# Patient Record
Sex: Female | Born: 1935
Health system: Southern US, Community
[De-identification: ages and names within clinical notes are randomized; demographics above are authoritative.]

## PROBLEM LIST (undated history)

## (undated) DIAGNOSIS — I454 Nonspecific intraventricular block: Secondary | ICD-10-CM

## (undated) DIAGNOSIS — M1990 Unspecified osteoarthritis, unspecified site: Secondary | ICD-10-CM

## (undated) DIAGNOSIS — I10 Essential (primary) hypertension: Secondary | ICD-10-CM

## (undated) DIAGNOSIS — B029 Zoster without complications: Secondary | ICD-10-CM

## (undated) DIAGNOSIS — E785 Hyperlipidemia, unspecified: Secondary | ICD-10-CM

## (undated) DIAGNOSIS — I129 Hypertensive chronic kidney disease with stage 1 through stage 4 chronic kidney disease, or unspecified chronic kidney disease: Secondary | ICD-10-CM

## (undated) DIAGNOSIS — I701 Atherosclerosis of renal artery: Secondary | ICD-10-CM

## (undated) DIAGNOSIS — K219 Gastro-esophageal reflux disease without esophagitis: Secondary | ICD-10-CM

## (undated) DIAGNOSIS — F329 Major depressive disorder, single episode, unspecified: Secondary | ICD-10-CM

## (undated) DIAGNOSIS — F32A Depression, unspecified: Secondary | ICD-10-CM

## (undated) DIAGNOSIS — N183 Chronic kidney disease, stage 3 unspecified: Secondary | ICD-10-CM

## (undated) DIAGNOSIS — F419 Anxiety disorder, unspecified: Secondary | ICD-10-CM

## (undated) DIAGNOSIS — F319 Bipolar disorder, unspecified: Secondary | ICD-10-CM

## (undated) DIAGNOSIS — E039 Hypothyroidism, unspecified: Secondary | ICD-10-CM

## (undated) DIAGNOSIS — K449 Diaphragmatic hernia without obstruction or gangrene: Secondary | ICD-10-CM

## (undated) DIAGNOSIS — M8580 Other specified disorders of bone density and structure, unspecified site: Secondary | ICD-10-CM

## (undated) DIAGNOSIS — I359 Nonrheumatic aortic valve disorder, unspecified: Secondary | ICD-10-CM

## (undated) HISTORY — DX: Chronic kidney disease, stage 3 unspecified: N18.30

## (undated) HISTORY — DX: Essential (primary) hypertension: I10

## (undated) HISTORY — DX: Other specified disorders of bone density and structure, unspecified site: M85.80

## (undated) HISTORY — DX: Nonrheumatic aortic valve disorder, unspecified: I35.9

## (undated) HISTORY — DX: Hyperlipidemia, unspecified: E78.5

## (undated) HISTORY — DX: Hypertensive chronic kidney disease with stage 1 through stage 4 chronic kidney disease, or unspecified chronic kidney disease: I12.9

## (undated) HISTORY — DX: Zoster without complications: B02.9

## (undated) HISTORY — DX: Diaphragmatic hernia without obstruction or gangrene: K44.9

## (undated) HISTORY — DX: Anxiety disorder, unspecified: F41.9

## (undated) HISTORY — DX: Depression, unspecified: F32.A

## (undated) HISTORY — DX: Nonspecific intraventricular block: I45.4

## (undated) HISTORY — DX: Major depressive disorder, single episode, unspecified: F32.9

## (undated) HISTORY — DX: Atherosclerosis of renal artery: I70.1

## (undated) HISTORY — DX: Gastro-esophageal reflux disease without esophagitis: K21.9

## (undated) HISTORY — DX: Hypothyroidism, unspecified: E03.9

## (undated) HISTORY — DX: Chronic kidney disease, stage 3 (moderate): N18.3

## (undated) HISTORY — DX: Bipolar disorder, unspecified: F31.9

## (undated) HISTORY — DX: Unspecified osteoarthritis, unspecified site: M19.90

---

## 1961-11-26 HISTORY — PX: HEMORRHOID SURGERY: SHX153

## 1998-06-02 ENCOUNTER — Other Ambulatory Visit: Admission: RE | Admit: 1998-06-02 | Discharge: 1998-06-02 | Payer: Self-pay | Admitting: Obstetrics and Gynecology

## 1999-06-06 ENCOUNTER — Other Ambulatory Visit: Admission: RE | Admit: 1999-06-06 | Discharge: 1999-06-06 | Payer: Self-pay | Admitting: Obstetrics and Gynecology

## 2000-12-26 ENCOUNTER — Encounter: Payer: Self-pay | Admitting: Emergency Medicine

## 2000-12-26 ENCOUNTER — Emergency Department (HOSPITAL_COMMUNITY): Admission: EM | Admit: 2000-12-26 | Discharge: 2000-12-26 | Payer: Self-pay | Admitting: *Deleted

## 2001-04-18 ENCOUNTER — Other Ambulatory Visit: Admission: RE | Admit: 2001-04-18 | Discharge: 2001-04-18 | Payer: Self-pay | Admitting: Obstetrics and Gynecology

## 2002-04-21 ENCOUNTER — Other Ambulatory Visit: Admission: RE | Admit: 2002-04-21 | Discharge: 2002-04-21 | Payer: Self-pay | Admitting: Obstetrics and Gynecology

## 2004-03-21 HISTORY — PX: CARDIAC CATHETERIZATION: SHX172

## 2006-11-05 ENCOUNTER — Ambulatory Visit: Payer: Self-pay | Admitting: Internal Medicine

## 2006-11-11 ENCOUNTER — Ambulatory Visit: Payer: Self-pay | Admitting: Internal Medicine

## 2006-11-11 ENCOUNTER — Encounter (INDEPENDENT_AMBULATORY_CARE_PROVIDER_SITE_OTHER): Payer: Self-pay | Admitting: *Deleted

## 2006-11-11 HISTORY — PX: PANENDOSCOPY: SHX2159

## 2006-11-11 HISTORY — PX: COLONOSCOPY: SHX174

## 2006-11-11 LAB — HM COLONOSCOPY

## 2010-04-02 ENCOUNTER — Emergency Department (HOSPITAL_COMMUNITY): Admission: EM | Admit: 2010-04-02 | Discharge: 2010-04-02 | Payer: Self-pay | Admitting: Emergency Medicine

## 2010-09-21 ENCOUNTER — Ambulatory Visit (HOSPITAL_COMMUNITY)
Admission: RE | Admit: 2010-09-21 | Discharge: 2010-09-21 | Payer: Self-pay | Source: Home / Self Care | Admitting: Surgery

## 2011-02-07 LAB — BASIC METABOLIC PANEL
BUN: 23 mg/dL (ref 6–23)
CO2: 29 mEq/L (ref 19–32)
Calcium: 9.9 mg/dL (ref 8.4–10.5)
Chloride: 107 mEq/L (ref 96–112)
Creatinine, Ser: 1.36 mg/dL — ABNORMAL HIGH (ref 0.4–1.2)
GFR calc Af Amer: 45 mL/min — ABNORMAL LOW (ref >60)
GFR calc non Af Amer: 37 mL/min — ABNORMAL LOW (ref >60)
Glucose, Bld: 100 mg/dL — ABNORMAL HIGH (ref 70–99)
Potassium: 5.3 mEq/L — ABNORMAL HIGH (ref 3.5–5.1)
Sodium: 140 mEq/L (ref 135–145)

## 2011-02-07 LAB — CBC
HCT: 33.1 % — ABNORMAL LOW (ref 36.0–46.0)
Hemoglobin: 10.8 g/dL — ABNORMAL LOW (ref 12.0–15.0)
MCH: 32 pg (ref 26.0–34.0)
MCHC: 32.6 g/dL (ref 30.0–36.0)
MCV: 97.9 fL (ref 78.0–100.0)
Platelets: 263 10*3/uL (ref 150–400)
RBC: 3.38 MIL/uL — ABNORMAL LOW (ref 3.87–5.11)
RDW: 12.6 % (ref 11.5–15.5)
WBC: 5.9 10*3/uL (ref 4.0–10.5)

## 2011-02-07 LAB — SURGICAL PCR SCREEN
MRSA, PCR: NEGATIVE
Staphylococcus aureus: NEGATIVE

## 2011-04-13 NOTE — Assessment & Plan Note (Signed)
[facility]                         GASTROENTEROLOGY OFFICE NOTE   LANDON, MOLESKI                        MRN:          [id number]  DATE:11/05/2006                            DOB:          June 15, 1923    REASON FOR CONSULTATION:  Screening colonoscopy.   HISTORY:  This is an 77 year old female with a history of  hyperlipidemia, hypothyroidism, and osteoarthritis who is referred  through the courtesy of Dr. Reynaldo Minium regarding screening colonoscopy.  The patient reports to me that she has been encouraged on several  occasions to undergo screening colonoscopy.  At this point she is  agreeable and is referred.  In terms of lower GI complaints, she reports  intermittent problems with abdominal cramping, loose stools  postprandially.  This has occurred over the years.  Occasionally four  bowel movements per day (generally in the morning).  Never after  retiring to bed for the evening.  She denies change in appetite, weight  loss, change in stool caliber, or rectal bleeding.  Next, she reports  chronic problems with heartburn and indigestion.  She takes no  medication.  In addition, she has had intermittent solid food dysphagia  for years.  She denies prior upper endoscopy.  She has had stool  hemoccults and sigmoidoscopy in the past which by her report have been  unremarkable.   PAST MEDICAL HISTORY:  1. Dyslipidemia.  2. Hypothyroidism.  3. Osteoarthritis.  4. Depression.  5. Hypertension.   PAST SURGICAL HISTORY:  Hemorrhoidectomy.   ALLERGIES:  No known drug allergies.   CURRENT MEDICATIONS:  1. Lithium 600 mg daily.  2. Wellbutrin 300 mg daily.  3. Altace 15 mg daily.  4. Vytorin 10 mg daily.  5. Synthroid 0.025 mg daily.  6. Aspirin 81 mg daily.  7. Zoloft 150 mg daily.  8. Toprol-XL 25 mg daily.  9. Vitamin E 400 international units daily.  10.Multivitamin.  11.Fosamax 70 mg once weekly.  12.Vitamin C 500 mg daily.   FAMILY  HISTORY:  No family history of gastrointestinal malignancy.  Two  brothers with diabetes and heart disease.   SOCIAL HISTORY:  The patient is married with one daughter.  Lives with  her husband.  Has a high school education.  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  Does not smoke or use  alcohol.   REVIEW OF SYSTEMS:  Per diagnostic evaluation form.   PHYSICAL EXAMINATION:  GENERAL:  Well-appearing female in no acute  distress.  VITAL SIGNS:  Blood pressure 112/54, heart rate is 56 and regular,  weight is 130 pounds.  She is 5 feet in height.  HEENT:  Sclerae are anicteric.  Conjunctivae are pink.  Oral mucosa is  intact.  There is no oral thrush.  Thyroid is normal.  No adenopathy.  LUNGS:  Clear to auscultation.  HEART:  Regular without murmur.  ABDOMEN:  Soft, nontender, nondistended, with good bowel sounds.  No  organomegaly, mass or hernia.  EXTREMITIES:  Without edema.   IMPRESSION:  1. Screening colonoscopy.  The patient is an appropriate  candidate      without contraindication.  2. Chronic lower gastrointestinal complaints manifested by      postprandial cramping with urgency and loose stools.  Most likely      irritable bowel.  3. Chronic gastroesophageal reflux disease with intermittent      dysphagia.  Rule out peptic stricture.  4. Normocytic anemia on recent laboratories with a hemoglobin of 11.8.   RECOMMENDATIONS:  1. Colonoscopy with polypectomy if indicated.  This to provide colon      cancer screening, evaluate lower GI complaints, as well as anemia.      The nature of the procedure as well as the risks, benefits, and      alternatives were reviewed in detail.  She understood and agreed to      proceed.  2. Initiate proton pump inhibitor therapy in the form of AcipHex 20 mg      daily.  This for symptomatic reflux disease.  Multiple AcipHex      samples have been provided.  3. Schedule upper endoscopy with possible esophageal  dilation.  The      nature of the procedure, as well as the risks, benefits and      alternatives were reviewed.  She understood and agreed to proceed.     Docia Chuck. Henrene Pastor, MD  Electronically Signed    JNP/MedQ  DD: 11/05/2006  DT: 11/06/2006  Job #: [id number]   cc:   Burnard Bunting, M.D.

## 2012-06-19 LAB — HM MAMMOGRAPHY

## 2013-04-10 ENCOUNTER — Ambulatory Visit: Payer: Self-pay | Admitting: Cardiovascular Disease

## 2013-04-15 ENCOUNTER — Encounter: Payer: Self-pay | Admitting: Cardiovascular Disease

## 2013-04-22 ENCOUNTER — Ambulatory Visit: Payer: Self-pay | Admitting: Cardiovascular Disease

## 2013-05-07 ENCOUNTER — Encounter: Payer: Self-pay | Admitting: Cardiovascular Disease

## 2013-05-07 ENCOUNTER — Ambulatory Visit: Payer: Self-pay | Admitting: Cardiovascular Disease

## 2013-05-07 ENCOUNTER — Ambulatory Visit: Payer: Self-pay | Admitting: Cardiology

## 2013-05-28 ENCOUNTER — Ambulatory Visit (INDEPENDENT_AMBULATORY_CARE_PROVIDER_SITE_OTHER): Payer: Medicare PPO | Admitting: Cardiovascular Disease

## 2013-05-28 ENCOUNTER — Encounter: Payer: Self-pay | Admitting: Cardiovascular Disease

## 2013-05-28 VITALS — BP 130/60 | HR 71 | Ht 62.0 in | Wt 138.0 lb

## 2013-05-28 DIAGNOSIS — I701 Atherosclerosis of renal artery: Secondary | ICD-10-CM

## 2013-05-28 DIAGNOSIS — I10 Essential (primary) hypertension: Secondary | ICD-10-CM

## 2013-05-28 DIAGNOSIS — E785 Hyperlipidemia, unspecified: Secondary | ICD-10-CM

## 2013-05-28 DIAGNOSIS — R0989 Other specified symptoms and signs involving the circulatory and respiratory systems: Secondary | ICD-10-CM

## 2013-05-28 DIAGNOSIS — I447 Left bundle-branch block, unspecified: Secondary | ICD-10-CM

## 2013-05-28 NOTE — Assessment & Plan Note (Signed)
On statin therapy followed by Dr. Reynaldo Minium

## 2013-05-28 NOTE — Assessment & Plan Note (Signed)
Left carotid Doppler was in 2008. We will recheck

## 2013-05-28 NOTE — Assessment & Plan Note (Signed)
On appropriate medications under good control

## 2013-05-28 NOTE — Progress Notes (Signed)
05/28/2013 Lynn Hayes   May 31, 1923  [id number]  Primary Physician No primary provider on file. Primary Cardiologist: [name] Renae Gloss   HPI:  The patient is a delightful 79 year old thin-appearing married Caucasian female, mother of 55, grandmother to 2 grandchildren, who I last saw a year ago. She has a history of normal coronary arteries by catheterization, which I performed March 21, 2004. At that time, I documented an 80% left renal artery stenosis, which we have been following by duplex ultrasound. This has remained remarkably stable. She has hypertension, hyperlipidemia, and chronic left bundle branch block. She is totally asymptomatic except for occasional atypical chest pain. Recent renal Dopplers, performed February 06, 2012, revealed no change with only mild renal artery stenosis by duplex. Her most recent lab work performed by Dr. Reynaldo Minium revealed a total cholesterol of 150, LDL of 57, HDL of 61 performed one year ago.    Current Outpatient Prescriptions  Medication Sig Dispense Refill  . aspirin EC 81 MG tablet Take 81 mg by mouth daily.      Marland Kitchen buPROPion (WELLBUTRIN XL) 150 MG 24 hr tablet Take 450 mg by mouth daily.      . DULoxetine (CYMBALTA) 60 MG capsule Take 60 mg by mouth daily.      Marland Kitchen L-Methylfolate (DEPLIN) 7.5 MG TABS Take 1 tablet by mouth daily.      Marland Kitchen levothyroxine (SYNTHROID, LEVOTHROID) 50 MCG tablet Take 50 mcg by mouth daily before breakfast.      . lithium carbonate 150 MG capsule Take 150 mg by mouth daily.      . metoprolol succinate (TOPROL-XL) 25 MG 24 hr tablet Take 25 mg by mouth daily.      . paliperidone (INVEGA) 3 MG 24 hr tablet Take 3 mg by mouth every morning.      . polycarbophil (FIBERCON) 625 MG tablet Take 625 mg by mouth daily.      . ramipril (ALTACE) 10 MG capsule Take 10 mg by mouth daily.      . simvastatin (ZOCOR) 10 MG tablet Take 10 mg by mouth at bedtime.       No current facility-administered medications for  this visit.    No Known Allergies  History   Social History  . Marital Status: Married    Spouse Name: N/A    Number of Children: 1  . Years of Education: N/A   Occupational History  . Not on file.   Social History Main Topics  . Smoking status: Never Smoker   . Smokeless tobacco: Never Used  . Alcohol Use: No  . Drug Use: No  . Sexually Active: Not on file   Other Topics Concern  . Not on file   Social History Narrative   Pt is married, mother of 20, grandmother of 2.       Review of Systems: General: negative for chills, fever, night sweats or weight changes.  Cardiovascular: negative for chest pain, dyspnea on exertion, edema, orthopnea, palpitations, paroxysmal nocturnal dyspnea or shortness of breath Dermatological: negative for rash Respiratory: negative for cough or wheezing Urologic: negative for hematuria Abdominal: negative for nausea, vomiting, diarrhea, bright red blood per rectum, melena, or hematemesis Neurologic: negative for visual changes, syncope, or dizziness All other systems reviewed and are otherwise negative except as noted above.    Blood pressure 130/60, pulse 71, height 5\' 2"  (1.575 m), weight 138 lb (62.596 kg).  General appearance: alert and no distress Neck: no adenopathy, no  JVD, supple, symmetrical, trachea midline, thyroid not enlarged, symmetric, no tenderness/mass/nodules and soft right carotid bruit Lungs: clear to auscultation bilaterally Heart: regular rate and rhythm, S1, S2 normal, no murmur, click, rub or gallop Extremities: extremities normal, atraumatic, no cyanosis or edema  EKG normal sinus rhythm at 71 with a left bundle branch block unchanged from prior EKG  ASSESSMENT AND PLAN:   Essential hypertension On appropriate medications under good control  Hyperlipidemia On statin therapy followed by Dr. Reynaldo Minium  Carotid bruit Left carotid Doppler was in 2008. We will recheck      [name]  [facility], [facility] 05/28/2013 4:29 PM

## 2013-05-28 NOTE — Patient Instructions (Addendum)
Your physician has requested that you have a carotid duplex. This test is an ultrasound of the carotid arteries in your neck. It looks at blood flow through these arteries that supply the brain with blood. Allow one hour for this exam. There are no restrictions or special instructions.  Your physician wants you to follow-up in: 1 year.  You will receive a reminder letter in the mail two months in advance. If you don't receive a letter, please call our office to schedule the follow-up appointment.  

## 2013-06-01 ENCOUNTER — Other Ambulatory Visit (HOSPITAL_COMMUNITY): Payer: Self-pay | Admitting: Cardiovascular Disease

## 2013-06-01 DIAGNOSIS — I739 Peripheral vascular disease, unspecified: Secondary | ICD-10-CM

## 2013-06-09 ENCOUNTER — Ambulatory Visit (HOSPITAL_COMMUNITY)
Admission: RE | Admit: 2013-06-09 | Discharge: 2013-06-09 | Disposition: A | Discharge status: Home / Self Care | Payer: Medicare PPO | Source: Ambulatory Visit | Attending: Cardiovascular Disease | Admitting: Cardiovascular Disease

## 2013-06-09 DIAGNOSIS — I701 Atherosclerosis of renal artery: Secondary | ICD-10-CM

## 2013-06-09 DIAGNOSIS — I739 Peripheral vascular disease, unspecified: Secondary | ICD-10-CM | POA: Insufficient documentation

## 2013-06-09 NOTE — Progress Notes (Signed)
Renal Artery Duplex Completed. °Lynn Hayes ° °

## 2013-06-18 ENCOUNTER — Ambulatory Visit (HOSPITAL_COMMUNITY)
Admission: RE | Admit: 2013-06-18 | Discharge: 2013-06-18 | Disposition: A | Discharge status: Home / Self Care | Payer: Medicare PPO | Source: Ambulatory Visit | Attending: Cardiovascular Disease | Admitting: Cardiovascular Disease

## 2013-06-18 DIAGNOSIS — R0989 Other specified symptoms and signs involving the circulatory and respiratory systems: Secondary | ICD-10-CM | POA: Insufficient documentation

## 2013-06-18 NOTE — Progress Notes (Signed)
Carotid Duplex Completed. Rita Sturdivant, RDMS, RVT  

## 2013-06-24 ENCOUNTER — Encounter: Payer: Self-pay | Admitting: *Deleted

## 2013-07-02 ENCOUNTER — Other Ambulatory Visit: Payer: Self-pay | Admitting: *Deleted

## 2013-07-02 MED ORDER — SIMVASTATIN 10 MG PO TABS: 10.0000 mg | ORAL_TABLET | Freq: Every day | ORAL | Status: DC

## 2013-07-15 ENCOUNTER — Other Ambulatory Visit: Payer: Self-pay | Admitting: *Deleted

## 2013-07-15 MED ORDER — SIMVASTATIN 10 MG PO TABS: 10.0000 mg | ORAL_TABLET | Freq: Every day | ORAL | Status: DC

## 2013-07-15 NOTE — Telephone Encounter (Signed)
Rx was sent to pharmacy electronically. 

## 2014-03-01 ENCOUNTER — Other Ambulatory Visit: Payer: Self-pay | Admitting: *Deleted

## 2014-03-01 MED ORDER — METOPROLOL SUCCINATE ER 25 MG PO TB24: 25.0000 mg | ORAL_TABLET | Freq: Every day | ORAL | Status: DC

## 2014-03-01 NOTE — Telephone Encounter (Signed)
Rx refill sent to pharmacy. 

## 2014-04-03 ENCOUNTER — Other Ambulatory Visit: Payer: Self-pay | Admitting: Cardiovascular Disease

## 2014-04-05 ENCOUNTER — Encounter: Payer: Self-pay | Admitting: Physician Assistant

## 2014-04-05 ENCOUNTER — Ambulatory Visit (INDEPENDENT_AMBULATORY_CARE_PROVIDER_SITE_OTHER): Payer: Medicare PPO | Admitting: Physician Assistant

## 2014-04-05 VITALS — BP 160/60 | HR 79 | Ht 62.0 in | Wt 133.0 lb

## 2014-04-05 DIAGNOSIS — R079 Chest pain, unspecified: Secondary | ICD-10-CM

## 2014-04-05 DIAGNOSIS — I10 Essential (primary) hypertension: Secondary | ICD-10-CM

## 2014-04-05 DIAGNOSIS — E785 Hyperlipidemia, unspecified: Secondary | ICD-10-CM

## 2014-04-05 NOTE — Assessment & Plan Note (Signed)
Blood pressure is elevated and however, corning to the patient's daughter she may have missed some doses of metoprolol and/or ramipril

## 2014-04-05 NOTE — Patient Instructions (Signed)
1.  Take Aleve other NSAID for the next three days as need and directed on the bottle. 2.  Follow up in July with Dr. Gwenlyn Found or sooner if needed.

## 2014-04-05 NOTE — Progress Notes (Signed)
Date:  04/05/2014   ID:  Lynn Hayes, DOB 08/11/1923, MRN [id number]  PCP:  No primary provider on file.  Primary Cardiologist:  Gwenlyn Found    History of Present Illness: Lynn Hayes is a 80 y.o.  thin-appearing married Caucasian female, mother of 81, grandmother to 2 grandchildren, who last saw Dr. Gwenlyn Found in July last year. She has a history of normal coronary arteries by catheterization, which Dr. Gwenlyn Found performed March 21, 2004. At that time, he documented an 80% left renal artery stenosis, which we have been following by duplex ultrasound. This has remained remarkably stable. She has hypertension, hyperlipidemia, and chronic left bundle branch block. She is totally asymptomatic except for occasional atypical chest pain. Recent renal Dopplers, performed February 06, 2012, revealed no change with only mild renal artery stenosis by duplex. Her most recent lab work performed by Dr. Reynaldo Hayes, over a year ago, revealed a total cholesterol of 150, LDL of 57, HDL of 61   Patient presents today with complaints of chest pain for the last three days.  She reports it is worse with a deep breath or cough.  The pain is fairly constant and not worse with exertion.it does not radiate to her arm neck back or jaw.  The patient currently denies nausea, vomiting, fever, shortness of breath, orthopnea, dizziness, PND, cough, congestion, abdominal pain, hematochezia, melena, lower extremity edema, claudication.  Wt Readings from Last 3 Encounters:  04/05/14 133 lb (60.328 kg)  05/28/13 138 lb (62.596 kg)     Past Medical History  Diagnosis Date  . Hypothyroid   . Bipolar 1 disorder   . Hiatal hernia   . Renal artery stenosis     left  . Hypertension   . Hyperlipidemia   . BBB (bundle branch block)     left, chronic  . GERD (gastroesophageal reflux disease)   . Osteoarthritis     Current Outpatient Prescriptions  Medication Sig Dispense Refill  . aspirin EC 81 MG tablet Take 81 mg by mouth daily.        Marland Kitchen buPROPion (WELLBUTRIN XL) 150 MG 24 hr tablet Take 450 mg by mouth daily.      . DULoxetine (CYMBALTA) 60 MG capsule Take 60 mg by mouth daily.      Marland Kitchen L-Methylfolate (DEPLIN) 7.5 MG TABS Take 1 tablet by mouth daily.      Marland Kitchen levothyroxine (SYNTHROID, LEVOTHROID) 50 MCG tablet Take 50 mcg by mouth daily before breakfast.      . lithium carbonate 150 MG capsule Take 150 mg by mouth daily.      . metoprolol succinate (TOPROL-XL) 25 MG 24 hr tablet Take 1 tablet (25 mg total) by mouth daily.  90 tablet  3  . paliperidone (INVEGA) 3 MG 24 hr tablet Take 3 mg by mouth every morning.      . ramipril (ALTACE) 10 MG capsule Take 10 mg by mouth daily.      . simvastatin (ZOCOR) 10 MG tablet TAKE 1 TABLET (10 MG TOTAL) BY MOUTH AT BEDTIME.  90 tablet  0   No current facility-administered medications for this visit.    Allergies:   No Known Allergies  Social History:  The patient  reports that she has never smoked. She has never used smokeless tobacco. She reports that she does not drink alcohol or use illicit drugs.   Family history:   Family History  Problem Relation Age of Onset  . Cancer Father   . Stroke  Father   . Stroke Sister 81  . Diabetes Brother   . Heart attack Brother   . Diabetes Brother   . Heart disease Brother     ROS:  Please see the history of present illness.  All other systems reviewed and negative.   PHYSICAL EXAM: VS:  BP 160/60  Pulse 79  Ht 5\' 2"  (1.575 m)  Wt 133 lb (60.328 kg)  BMI 24.32 kg/m2 Well nourished, well developed, in no acute distress HEENT: Pupils are equal round react to light accommodation extraocular movements are intact.  Neck: no JVD Cardiac: Regular rate and rhythm without murmurs rubs or gallops. Chest wall: Nontender Lungs:  clear to auscultation bilaterally, no wheezing, rhonchi or rales Abd: soft, nontender, positive bowel sounds all quadrants, no hepatosplenomegaly Ext: no lower extremity edema.  2+ radial and dorsalis pedis  pulses. Skin: warm and dry Neuro:  Grossly normal  EKG:   Left bundle-branch block. Rate 79 beats per minute.  ASSESSMENT AND PLAN:  Problem List Items Addressed This Visit   Essential hypertension     Blood pressure is elevated and however, corning to the patient's daughter she may have missed some doses of metoprolol and/or ramipril    Hyperlipidemia     Treated with statin    Chest pain - Primary     The patient's chest pain appears musculoskeletal and noncardiac. It is worse with inspiration and coughing. She is unsure if she developed a cough prior to the pain or vice versa. However it is not worse with exertion nor with palpation of the chest wall. Recommended a short course of ibuprofen or Aleve and have asked her to pay attention if it actually helps the symptoms.  Patient's cough is nonproductive and lungs are clear on exam. Do not think a chest x-ray is necessary at this time.    Relevant Orders      EKG 12-Lead

## 2014-04-05 NOTE — Assessment & Plan Note (Signed)
The patient's chest pain appears musculoskeletal and noncardiac. It is worse with inspiration and coughing. She is unsure if she developed a cough prior to the pain or vice versa. However it is not worse with exertion nor with palpation of the chest wall. Recommended a short course of ibuprofen or Aleve and have asked her to pay attention if it actually helps the symptoms.  Patient's cough is nonproductive and lungs are clear on exam. Do not think a chest x-ray is necessary at this time.

## 2014-04-05 NOTE — Telephone Encounter (Signed)
Rx was sent to pharmacy electronically. 

## 2014-04-05 NOTE — Assessment & Plan Note (Signed)
Treated with statin

## 2014-04-09 ENCOUNTER — Telehealth: Payer: Self-pay | Admitting: Cardiovascular Disease

## 2014-04-15 NOTE — Telephone Encounter (Signed)
Closed encounter °

## 2014-05-25 ENCOUNTER — Encounter: Payer: Self-pay | Admitting: Cardiovascular Disease

## 2014-05-25 ENCOUNTER — Ambulatory Visit (INDEPENDENT_AMBULATORY_CARE_PROVIDER_SITE_OTHER): Payer: Medicare PPO | Admitting: Cardiovascular Disease

## 2014-05-25 VITALS — BP 140/58 | HR 74 | Ht 61.0 in | Wt 138.6 lb

## 2014-05-25 DIAGNOSIS — I701 Atherosclerosis of renal artery: Secondary | ICD-10-CM

## 2014-05-25 DIAGNOSIS — I10 Essential (primary) hypertension: Secondary | ICD-10-CM

## 2014-05-25 DIAGNOSIS — E785 Hyperlipidemia, unspecified: Secondary | ICD-10-CM

## 2014-05-25 NOTE — Assessment & Plan Note (Signed)
On statin therapy followed by her PCP 

## 2014-05-25 NOTE — Assessment & Plan Note (Signed)
Her last renal duplex performed July 2014 related not show any significant stenosis. She did have an 80% left renal artery stenosis documented by abdominal aortography the time of cardiac catheterization 03/21/04.

## 2014-05-25 NOTE — Patient Instructions (Signed)
Your physician wants you to follow-up in: 1 year with Dr Berry. You will receive a reminder letter in the mail two months in advance. If you don't receive a letter, please call our office to schedule the follow-up appointment.  

## 2014-05-25 NOTE — Assessment & Plan Note (Signed)
Controlled on current medications 

## 2014-05-25 NOTE — Progress Notes (Signed)
05/25/2014 Lynn Hayes   1923-04-04  [id number]  Primary Physician ARONSON,RICHARD A, MD Primary Cardiologist: [name] Renae Gloss   HPI:  The patient is a delightful 80 year old thin-appearing married Caucasian female, mother of 25, grandmother to 2 grandchildren, who I last saw a year ago. She has a history of normal coronary arteries by catheterization, which I performed March 21, 2004. At that time, I documented an 80% left renal artery stenosis, which we have been following by duplex ultrasound. This has remained remarkably stable. She has hypertension, hyperlipidemia, and chronic left bundle branch block. She is totally asymptomatic except for occasional atypical chest pain. Recent renal Dopplers, performed July 2014 revealed no change with only mild renal artery stenosis by duplex.Dr. Reynaldo Minium follows her lipid profile closely. She denies chest pain or shortness of breath.   Current Outpatient Prescriptions  Medication Sig Dispense Refill  . aspirin EC 81 MG tablet Take 81 mg by mouth daily.      Marland Kitchen buPROPion (WELLBUTRIN XL) 150 MG 24 hr tablet Take 450 mg by mouth daily.      . DULoxetine (CYMBALTA) 60 MG capsule Take 60 mg by mouth daily.      Marland Kitchen L-Methylfolate (DEPLIN) 7.5 MG TABS Take 1 tablet by mouth daily.      Marland Kitchen levothyroxine (SYNTHROID, LEVOTHROID) 50 MCG tablet Take 50 mcg by mouth daily before breakfast.      . lithium carbonate 150 MG capsule Take 150 mg by mouth daily.      . metoprolol succinate (TOPROL-XL) 25 MG 24 hr tablet Take 1 tablet (25 mg total) by mouth daily.  90 tablet  3  . paliperidone (INVEGA) 3 MG 24 hr tablet Take 3 mg by mouth every morning.      . ramipril (ALTACE) 10 MG capsule Take 10 mg by mouth daily.      . simvastatin (ZOCOR) 10 MG tablet TAKE 1 TABLET (10 MG TOTAL) BY MOUTH AT BEDTIME.  90 tablet  0   No current facility-administered medications for this visit.    No Known Allergies  History   Social History  .  Marital Status: Married    Spouse Name: N/A    Number of Children: 1  . Years of Education: N/A   Occupational History  . Not on file.   Social History Main Topics  . Smoking status: Never Smoker   . Smokeless tobacco: Never Used  . Alcohol Use: No  . Drug Use: No  . Sexual Activity: Not on file   Other Topics Concern  . Not on file   Social History Narrative   Pt is married, mother of 40, grandmother of 2.       Review of Systems: General: negative for chills, fever, night sweats or weight changes.  Cardiovascular: negative for chest pain, dyspnea on exertion, edema, orthopnea, palpitations, paroxysmal nocturnal dyspnea or shortness of breath Dermatological: negative for rash Respiratory: negative for cough or wheezing Urologic: negative for hematuria Abdominal: negative for nausea, vomiting, diarrhea, bright red blood per rectum, melena, or hematemesis Neurologic: negative for visual changes, syncope, or dizziness All other systems reviewed and are otherwise negative except as noted above.    Blood pressure 140/58, pulse 74, height 5\' 1"  (1.549 m), weight 138 lb 9.6 oz (62.869 kg).  General appearance: alert and no distress Neck: no adenopathy, no carotid bruit, no JVD, supple, symmetrical, trachea midline and thyroid not enlarged, symmetric, no tenderness/mass/nodules Lungs: clear to auscultation bilaterally  Heart: regular rate and rhythm, S1, S2 normal, no murmur, click, rub or gallop Extremities: extremities normal, atraumatic, no cyanosis or edema  EKG not performed today  ASSESSMENT AND PLAN:   Renal artery stenosis Her last renal duplex performed July 2014 related not show any significant stenosis. She did have an 80% left renal artery stenosis documented by abdominal aortography the time of cardiac catheterization 03/21/04.  Hyperlipidemia On statin therapy followed by her PCP  Essential hypertension Controlled on current medications      [name] [facility], [facility] 05/25/2014 3:47 PM

## 2014-06-07 ENCOUNTER — Other Ambulatory Visit: Payer: Self-pay | Admitting: Cardiovascular Disease

## 2014-06-07 NOTE — Telephone Encounter (Signed)
Rx was sent to pharmacy electronically. 

## 2014-07-01 ENCOUNTER — Other Ambulatory Visit: Payer: Self-pay | Admitting: *Deleted

## 2014-07-01 MED ORDER — RAMIPRIL 10 MG PO CAPS: 10.0000 mg | ORAL_CAPSULE | Freq: Every day | ORAL | Status: DC

## 2014-09-22 ENCOUNTER — Other Ambulatory Visit: Payer: Self-pay

## 2014-09-23 ENCOUNTER — Other Ambulatory Visit: Payer: Self-pay

## 2014-09-28 ENCOUNTER — Other Ambulatory Visit: Payer: Self-pay

## 2014-12-23 DIAGNOSIS — H5704 Mydriasis: Secondary | ICD-10-CM | POA: Diagnosis not present

## 2014-12-23 DIAGNOSIS — Z961 Presence of intraocular lens: Secondary | ICD-10-CM | POA: Diagnosis not present

## 2014-12-23 DIAGNOSIS — H52203 Unspecified astigmatism, bilateral: Secondary | ICD-10-CM | POA: Diagnosis not present

## 2015-03-05 ENCOUNTER — Other Ambulatory Visit: Payer: Self-pay | Admitting: Cardiovascular Disease

## 2015-03-19 ENCOUNTER — Other Ambulatory Visit: Payer: Self-pay | Admitting: Cardiovascular Disease

## 2015-03-21 NOTE — Telephone Encounter (Signed)
Rx has been sent to the pharmacy electronically. ° °

## 2015-05-17 ENCOUNTER — Ambulatory Visit (INDEPENDENT_AMBULATORY_CARE_PROVIDER_SITE_OTHER): Payer: Medicare PPO | Admitting: Cardiovascular Disease

## 2015-05-17 ENCOUNTER — Encounter: Payer: Self-pay | Admitting: Cardiovascular Disease

## 2015-05-17 VITALS — BP 132/56 | HR 66 | Ht 62.0 in | Wt 138.5 lb

## 2015-05-17 DIAGNOSIS — I701 Atherosclerosis of renal artery: Secondary | ICD-10-CM | POA: Diagnosis not present

## 2015-05-17 DIAGNOSIS — I10 Essential (primary) hypertension: Secondary | ICD-10-CM | POA: Diagnosis not present

## 2015-05-17 DIAGNOSIS — E785 Hyperlipidemia, unspecified: Secondary | ICD-10-CM

## 2015-05-17 DIAGNOSIS — I447 Left bundle-branch block, unspecified: Secondary | ICD-10-CM

## 2015-05-17 DIAGNOSIS — R0989 Other specified symptoms and signs involving the circulatory and respiratory systems: Secondary | ICD-10-CM

## 2015-05-17 NOTE — Assessment & Plan Note (Signed)
chronic

## 2015-05-17 NOTE — Progress Notes (Signed)
05/17/2015 Lynn Hayes   08/05/1923  [id number]  Primary Physician ARONSON,RICHARD A, MD Primary Cardiologist: [name] Renae Gloss    HPI: The patient is a delightful 81 year old thin-appearing married Caucasian female, mother of 3, grandmother to 2 grandchildren, who I last saw a year ago. She has a history of normal coronary arteries by catheterization, which I performed March 21, 2004. At that time, I documented an 80% left renal artery stenosis, which we have been following by duplex ultrasound. This has remained remarkably stable. She has hypertension, hyperlipidemia, and chronic left bundle branch block. She is totally asymptomatic except for occasional atypical chest pain. Recent renal Dopplers, performed July 2014 revealed no change with only mild renal artery stenosis by duplex.Dr. Reynaldo Minium follows her lipid profile closely. She denies chest pain or shortness of breath.   Current Outpatient Prescriptions  Medication Sig Dispense Refill  . aspirin EC 81 MG tablet Take 81 mg by mouth daily.    Marland Kitchen buPROPion (WELLBUTRIN XL) 150 MG 24 hr tablet Take 450 mg by mouth daily.    . DULoxetine (CYMBALTA) 60 MG capsule Take 60 mg by mouth daily.    Marland Kitchen L-Methylfolate (DEPLIN) 7.5 MG TABS Take 1 tablet by mouth daily.    Marland Kitchen levothyroxine (SYNTHROID, LEVOTHROID) 50 MCG tablet Take 50 mcg by mouth daily before breakfast.    . lithium carbonate 150 MG capsule Take 150 mg by mouth daily.    . metoprolol succinate (TOPROL-XL) 25 MG 24 hr tablet TAKE 1 TABLET (25 MG TOTAL) BY MOUTH DAILY. 90 tablet 0  . paliperidone (INVEGA) 3 MG 24 hr tablet Take 3 mg by mouth every morning.    . ramipril (ALTACE) 10 MG capsule TAKE 1 CAPSULE (10 MG TOTAL) BY MOUTH DAILY. 90 capsule 1  . simvastatin (ZOCOR) 10 MG tablet TAKE 1 TABLET (10 MG TOTAL) BY MOUTH AT BEDTIME. 90 tablet 3   No current facility-administered medications for this visit.    No Known Allergies  History   Social  History  . Marital Status: Married    Spouse Name: N/A  . Number of Children: 1  . Years of Education: N/A   Occupational History  . Not on file.   Social History Main Topics  . Smoking status: Never Smoker   . Smokeless tobacco: Never Used  . Alcohol Use: No  . Drug Use: No  . Sexual Activity: Not on file   Other Topics Concern  . Not on file   Social History Narrative   Pt is married, mother of 19, grandmother of 2.       Review of Systems: General: negative for chills, fever, night sweats or weight changes.  Cardiovascular: negative for chest pain, dyspnea on exertion, edema, orthopnea, palpitations, paroxysmal nocturnal dyspnea or shortness of breath Dermatological: negative for rash Respiratory: negative for cough or wheezing Urologic: negative for hematuria Abdominal: negative for nausea, vomiting, diarrhea, bright red blood per rectum, melena, or hematemesis Neurologic: negative for visual changes, syncope, or dizziness All other systems reviewed and are otherwise negative except as noted above.    Blood pressure 132/56, pulse 66, height 5\' 2"  (1.575 m), weight 138 lb 8 oz (62.823 kg).  General appearance: alert and no distress Neck: no adenopathy, no JVD, supple, symmetrical, trachea midline, thyroid not enlarged, symmetric, no tenderness/mass/nodules and left carotid bruit Lungs: clear to auscultation bilaterally Heart: regular rate and rhythm, S1, S2 normal, no murmur, click, rub or gallop Extremities: extremities normal,  atraumatic, no cyanosis or edema  EKG normal sinus rhythm at 66 with left bundle branch block unchanged from prior EKGs. I presume reviewed this EKG  ASSESSMENT AND PLAN:   Renal artery stenosis History of 80% left renal artery stenosis found at the time of angiography during a heart cath in 2005 which we have been following by duplex ultrasound. This was most recently checked 2 years ago, 06/09/13, and was found to be widely patent.  Left  bundle branch block chronic  Hyperlipidemia History of hyperlipidemia on simvastatin 10 mg a day followed by her PCP  Essential hypertension History of hypertension blood pressure measured today at 132/56. She is on ramipril. Continue current meds at current dosing  Carotid bruit History of left carotid bruit with recent carotid Dopplers performed 2 years ago that showed no evidence of significant ICA stenosis      [name] [facility], [facility] 05/17/2015 11:38 AM

## 2015-05-17 NOTE — Assessment & Plan Note (Signed)
History of hyperlipidemia on simvastatin 10 mg a day followed by her PCP 

## 2015-05-17 NOTE — Patient Instructions (Signed)
Dr Berry recommends that you schedule a follow-up appointment in 1 year. You will receive a reminder letter in the mail two months in advance. If you don't receive a letter, please call our office to schedule the follow-up appointment. 

## 2015-05-17 NOTE — Assessment & Plan Note (Signed)
History of hypertension blood pressure measured today at 132/56. She is on ramipril. Continue current meds at current dosing

## 2015-05-17 NOTE — Assessment & Plan Note (Signed)
History of 80% left renal artery stenosis found at the time of angiography during a heart cath in 2005 which we have been following by duplex ultrasound. This was most recently checked 2 years ago, 06/09/13, and was found to be widely patent.

## 2015-05-17 NOTE — Assessment & Plan Note (Signed)
History of left carotid bruit with recent carotid Dopplers performed 2 years ago that showed no evidence of significant ICA stenosis

## 2015-05-30 ENCOUNTER — Other Ambulatory Visit: Payer: Self-pay | Admitting: Cardiovascular Disease

## 2015-05-31 NOTE — Telephone Encounter (Signed)
Rx(s) sent to pharmacy electronically.  

## 2015-06-10 ENCOUNTER — Other Ambulatory Visit: Payer: Self-pay | Admitting: Cardiovascular Disease

## 2015-06-10 NOTE — Telephone Encounter (Signed)
REFILL 

## 2015-06-14 DIAGNOSIS — E785 Hyperlipidemia, unspecified: Secondary | ICD-10-CM | POA: Diagnosis not present

## 2015-06-14 DIAGNOSIS — M859 Disorder of bone density and structure, unspecified: Secondary | ICD-10-CM | POA: Diagnosis not present

## 2015-06-14 DIAGNOSIS — E039 Hypothyroidism, unspecified: Secondary | ICD-10-CM | POA: Diagnosis not present

## 2015-06-14 DIAGNOSIS — I10 Essential (primary) hypertension: Secondary | ICD-10-CM | POA: Diagnosis not present

## 2015-06-14 DIAGNOSIS — N183 Chronic kidney disease, stage 3 (moderate): Secondary | ICD-10-CM | POA: Diagnosis not present

## 2015-06-14 DIAGNOSIS — F319 Bipolar disorder, unspecified: Secondary | ICD-10-CM | POA: Diagnosis not present

## 2015-06-14 LAB — LIPID PANEL
CHOLESTEROL: 184 mg/dL (ref 0–200)
HDL: 67 mg/dL (ref 35–70)
LDL CALC: 95 mg/dL
TRIGLYCERIDES: 111 mg/dL (ref 40–160)

## 2015-06-14 LAB — BASIC METABOLIC PANEL
BUN: 26 mg/dL — AB (ref 4–21)
Creatinine: 1.6 mg/dL — AB (ref 0.5–1.1)
Glucose: 106 mg/dL
Potassium: 4.9 mmol/L (ref 3.4–5.3)
SODIUM: 143 mmol/L (ref 137–147)

## 2015-06-14 LAB — HEPATIC FUNCTION PANEL
ALT: 21 U/L (ref 7–35)
AST: 19 U/L (ref 13–35)
Alkaline Phosphatase: 63 U/L (ref 25–125)
BILIRUBIN, TOTAL: 0.4 mg/dL

## 2015-06-14 LAB — CBC AND DIFFERENTIAL
HEMATOCRIT: 37 % (ref 36–46)
Hemoglobin: 12.1 g/dL (ref 12.0–16.0)
Platelets: 385 10*3/uL (ref 150–399)
WBC: 7.2 10^3/mL

## 2015-06-14 LAB — TSH: TSH: 1.73 u[IU]/mL (ref 0.41–5.90)

## 2015-06-16 DIAGNOSIS — I129 Hypertensive chronic kidney disease with stage 1 through stage 4 chronic kidney disease, or unspecified chronic kidney disease: Secondary | ICD-10-CM | POA: Diagnosis not present

## 2015-06-16 DIAGNOSIS — Z6826 Body mass index (BMI) 26.0-26.9, adult: Secondary | ICD-10-CM | POA: Diagnosis not present

## 2015-06-16 DIAGNOSIS — I10 Essential (primary) hypertension: Secondary | ICD-10-CM | POA: Diagnosis not present

## 2015-06-16 DIAGNOSIS — F418 Other specified anxiety disorders: Secondary | ICD-10-CM | POA: Diagnosis not present

## 2015-06-16 DIAGNOSIS — F319 Bipolar disorder, unspecified: Secondary | ICD-10-CM | POA: Diagnosis not present

## 2015-06-16 DIAGNOSIS — R8290 Unspecified abnormal findings in urine: Secondary | ICD-10-CM | POA: Diagnosis not present

## 2015-06-16 DIAGNOSIS — I358 Other nonrheumatic aortic valve disorders: Secondary | ICD-10-CM | POA: Diagnosis not present

## 2015-06-16 DIAGNOSIS — Z0000 Encounter for general adult medical examination without abnormal findings: Secondary | ICD-10-CM | POA: Diagnosis not present

## 2015-06-16 DIAGNOSIS — E785 Hyperlipidemia, unspecified: Secondary | ICD-10-CM | POA: Diagnosis not present

## 2015-06-16 DIAGNOSIS — N183 Chronic kidney disease, stage 3 (moderate): Secondary | ICD-10-CM | POA: Diagnosis not present

## 2015-06-16 DIAGNOSIS — N390 Urinary tract infection, site not specified: Secondary | ICD-10-CM | POA: Diagnosis not present

## 2015-06-16 LAB — MICROALBUMIN, URINE: MICROALB UR: 8

## 2015-06-20 DIAGNOSIS — Z1212 Encounter for screening for malignant neoplasm of rectum: Secondary | ICD-10-CM | POA: Diagnosis not present

## 2015-06-23 DIAGNOSIS — E785 Hyperlipidemia, unspecified: Secondary | ICD-10-CM | POA: Diagnosis not present

## 2015-06-23 DIAGNOSIS — I10 Essential (primary) hypertension: Secondary | ICD-10-CM | POA: Diagnosis not present

## 2015-06-23 DIAGNOSIS — F330 Major depressive disorder, recurrent, mild: Secondary | ICD-10-CM | POA: Diagnosis not present

## 2015-06-23 DIAGNOSIS — M545 Low back pain: Secondary | ICD-10-CM | POA: Diagnosis not present

## 2015-06-23 DIAGNOSIS — G2581 Restless legs syndrome: Secondary | ICD-10-CM | POA: Diagnosis not present

## 2015-06-23 DIAGNOSIS — E039 Hypothyroidism, unspecified: Secondary | ICD-10-CM | POA: Diagnosis not present

## 2015-06-23 DIAGNOSIS — Z6825 Body mass index (BMI) 25.0-25.9, adult: Secondary | ICD-10-CM | POA: Diagnosis not present

## 2015-10-02 ENCOUNTER — Other Ambulatory Visit: Payer: Self-pay | Admitting: Cardiovascular Disease

## 2015-10-03 ENCOUNTER — Other Ambulatory Visit: Payer: Self-pay

## 2015-10-03 MED ORDER — RAMIPRIL 10 MG PO CAPS: ORAL_CAPSULE | ORAL | Status: DC

## 2015-10-03 NOTE — Telephone Encounter (Signed)
Request to refill Ramipril 10 mg. Given 90 with 2 refills.

## 2016-03-06 ENCOUNTER — Ambulatory Visit: Payer: Self-pay | Admitting: Internal Medicine

## 2016-05-17 ENCOUNTER — Ambulatory Visit (INDEPENDENT_AMBULATORY_CARE_PROVIDER_SITE_OTHER): Payer: Medicare Other | Admitting: Cardiovascular Disease

## 2016-05-17 ENCOUNTER — Encounter: Payer: Self-pay | Admitting: Cardiovascular Disease

## 2016-05-17 VITALS — BP 148/60 | HR 74 | Ht 62.0 in | Wt 136.2 lb

## 2016-05-17 DIAGNOSIS — E785 Hyperlipidemia, unspecified: Secondary | ICD-10-CM | POA: Diagnosis not present

## 2016-05-17 DIAGNOSIS — I701 Atherosclerosis of renal artery: Secondary | ICD-10-CM

## 2016-05-17 DIAGNOSIS — I447 Left bundle-branch block, unspecified: Secondary | ICD-10-CM

## 2016-05-17 DIAGNOSIS — I10 Essential (primary) hypertension: Secondary | ICD-10-CM

## 2016-05-17 NOTE — Assessment & Plan Note (Signed)
history of hypertension blood pressure measured at 140/60. She is on ramipril and metoprolol. Continue current meds at current dosing

## 2016-05-17 NOTE — Assessment & Plan Note (Signed)
History of hyperlipidemia on statin therapy followed by her PCP. 

## 2016-05-17 NOTE — Patient Instructions (Addendum)

## 2016-05-17 NOTE — Assessment & Plan Note (Signed)
History of 80% left renal artery stenosis which we are no longer following by duplex ultrasound

## 2016-05-17 NOTE — Progress Notes (Signed)
05/17/2016 Lynn Hayes   [birth year]  [id number]  Primary Physician ARONSON,RICHARD A, MD Primary Cardiologist: [name] Lynn Hayes  HPI:  The patient is a delightful 82 -year-old thin-appearing married Caucasian female, mother of 59, grandmother to 2 grandchildren, who I last saw 05/17/15  She has a history of normal coronary arteries by catheterization, which I performed March 21, 2004. At that time, I documented an 80% left renal artery stenosis, which we have been following by duplex ultrasound. This has remained remarkably stable. She has hypertension, hyperlipidemia, and chronic left bundle branch block. She is totally asymptomatic except for occasional atypical chest pain. Recent renal Dopplers, performed July 2014 revealed no change with only mild renal artery stenosis by duplex.Dr. Reynaldo Hayes follows her lipid profile closely. She denies chest pain or shortness of breath.   Current Outpatient Prescriptions  Medication Sig Dispense Refill  . aspirin EC 81 MG tablet Take 81 mg by mouth daily.    Marland Kitchen buPROPion (WELLBUTRIN XL) 150 MG 24 hr tablet Take 450 mg by mouth daily.    . DULoxetine (CYMBALTA) 60 MG capsule Take 60 mg by mouth daily.    Marland Kitchen L-Methylfolate (DEPLIN) 7.5 MG TABS Take 1 tablet by mouth daily.    Marland Kitchen levothyroxine (SYNTHROID, LEVOTHROID) 50 MCG tablet Take 50 mcg by mouth daily before breakfast.    . lithium carbonate 150 MG capsule Take 150 mg by mouth daily.    . metoprolol succinate (TOPROL-XL) 25 MG 24 hr tablet TAKE 1 TABLET (25 MG TOTAL) BY MOUTH DAILY. 90 tablet 3  . paliperidone (INVEGA) 3 MG 24 hr tablet Take 3 mg by mouth every morning.    . ramipril (ALTACE) 10 MG capsule TAKE 1 CAPSULE (10 MG TOTAL) BY MOUTH DAILY. 90 capsule 2  . simvastatin (ZOCOR) 10 MG tablet TAKE 1 TABLET (10 MG TOTAL) BY MOUTH AT BEDTIME. 90 tablet 2   No current facility-administered medications for this visit.    No Known Allergies  Social History   Social  History  . Marital Status: Married    Spouse Name: N/A  . Number of Children: 1  . Years of Education: N/A   Occupational History  . Not on file.   Social History Main Topics  . Smoking status: Never Smoker   . Smokeless tobacco: Never Used  . Alcohol Use: No  . Drug Use: No  . Sexual Activity: Not on file   Other Topics Concern  . Not on file   Social History Narrative   Pt is married, mother of 54, grandmother of 2.       Review of Systems: General: negative for chills, fever, night sweats or weight changes.  Cardiovascular: negative for chest pain, dyspnea on exertion, edema, orthopnea, palpitations, paroxysmal nocturnal dyspnea or shortness of breath Dermatological: negative for rash Respiratory: negative for cough or wheezing Urologic: negative for hematuria Abdominal: negative for nausea, vomiting, diarrhea, bright red blood per rectum, melena, or hematemesis Neurologic: negative for visual changes, syncope, or dizziness All other systems reviewed and are otherwise negative except as noted above.    Blood pressure 148/60, pulse 74, height 5\' 2"  (1.575 m), weight 136 lb 3.2 oz (61.78 kg).  General appearance: alert and no distress Neck: no adenopathy, no carotid bruit, no JVD, supple, symmetrical, trachea midline and thyroid not enlarged, symmetric, no tenderness/mass/nodules Lungs: clear to auscultation bilaterally Heart: regular rate and rhythm, S1, S2 normal, no murmur, click, rub or gallop Extremities:  extremities normal, atraumatic, no cyanosis or edema  EKG normal sinus rhythm at 74 with left bundle-branch block unchanged from prior EKGs. I personally reviewed this EKG  ASSESSMENT AND PLAN:   Left bundle branch block chronic  Essential hypertension history of hypertension blood pressure measured at 140/60. She is on ramipril and metoprolol. Continue current meds at current dosing  Hyperlipidemia History of hyperlipidemia on statin therapy followed by her  PCP  Renal artery stenosis History of 80% left renal artery stenosis which we are no longer following by duplex ultrasound      [name] [facility], [facility] 05/17/2016 10:37 AM

## 2016-05-17 NOTE — Assessment & Plan Note (Signed)
chronic

## 2016-06-29 ENCOUNTER — Encounter (HOSPITAL_COMMUNITY): Payer: Self-pay | Admitting: *Deleted

## 2016-06-29 ENCOUNTER — Emergency Department (HOSPITAL_COMMUNITY): Payer: Medicare Other

## 2016-06-29 ENCOUNTER — Emergency Department (HOSPITAL_COMMUNITY)
Admission: EM | Admit: 2016-06-29 | Discharge: 2016-06-29 | Disposition: A | Discharge status: Home / Self Care | Payer: Medicare Other | Attending: Emergency Medicine | Admitting: Emergency Medicine

## 2016-06-29 DIAGNOSIS — S51011A Laceration without foreign body of right elbow, initial encounter: Secondary | ICD-10-CM

## 2016-06-29 DIAGNOSIS — Z7982 Long term (current) use of aspirin: Secondary | ICD-10-CM | POA: Insufficient documentation

## 2016-06-29 DIAGNOSIS — W0110XA Fall on same level from slipping, tripping and stumbling with subsequent striking against unspecified object, initial encounter: Secondary | ICD-10-CM | POA: Insufficient documentation

## 2016-06-29 DIAGNOSIS — S0093XA Contusion of unspecified part of head, initial encounter: Secondary | ICD-10-CM | POA: Diagnosis not present

## 2016-06-29 DIAGNOSIS — Z79899 Other long term (current) drug therapy: Secondary | ICD-10-CM | POA: Insufficient documentation

## 2016-06-29 DIAGNOSIS — W19XXXA Unspecified fall, initial encounter: Secondary | ICD-10-CM

## 2016-06-29 DIAGNOSIS — Y999 Unspecified external cause status: Secondary | ICD-10-CM | POA: Diagnosis not present

## 2016-06-29 DIAGNOSIS — Y92019 Unspecified place in single-family (private) house as the place of occurrence of the external cause: Secondary | ICD-10-CM | POA: Insufficient documentation

## 2016-06-29 DIAGNOSIS — I10 Essential (primary) hypertension: Secondary | ICD-10-CM | POA: Diagnosis not present

## 2016-06-29 DIAGNOSIS — M25512 Pain in left shoulder: Secondary | ICD-10-CM

## 2016-06-29 DIAGNOSIS — Y939 Activity, unspecified: Secondary | ICD-10-CM | POA: Diagnosis not present

## 2016-06-29 DIAGNOSIS — S0990XA Unspecified injury of head, initial encounter: Secondary | ICD-10-CM | POA: Diagnosis present

## 2016-06-29 NOTE — ED Triage Notes (Signed)
Per EMS, pt from [place] tripped and fell today. Pt has abrasion to left forehead and skin tear to right arm. Pt has left forehead contusion and shoulder pain from a fall 1 week ago. Pt denies loss of consciousness.

## 2016-06-29 NOTE — ED Notes (Signed)
Bed: [facility] Expected date:  Expected time:  Means of arrival:  Comments: 89/unwitnessed fall

## 2016-06-29 NOTE — ED Provider Notes (Signed)
[facility] DEPT Provider Note   CSN: [id number] Arrival date & time: 06/29/16  1523  First Provider Contact:  First MD Initiated Contact with Patient 06/29/16 1555        History   Chief Complaint Chief Complaint  Patient presents with  . Fall    HPI Lynn Hayes is a 82 y.o. female.  81yo F w/ PMH including LBBB, bipolar disorder, GERD, HTN who p/w fall. Just prior to arrival, the patient tripped on carpet and fell forward, striking her forehead on the ground. She did not lose consciousness. She sustained a skin tear to her right arm. She reports mild pain in her forehead as well as mild left shoulder pain which is from a fall that occurred 1 week ago. No extremity weakness or numbness. No neck, back, chest, or abdominal pain. No hip pain. She denies any anticoagulant use. Tetanus is up-to-date.   The history is provided by the patient.  Fall     Past Medical History:  Diagnosis Date  . BBB (bundle branch block)    left, chronic  . Bipolar 1 disorder ([place])   . GERD (gastroesophageal reflux disease)   . Hiatal hernia   . Hyperlipidemia   . Hypertension   . Hypothyroid   . Osteoarthritis   . Renal artery stenosis [facility])    left    Patient Active Problem List   Diagnosis Date Noted  . Chest pain 04/05/2014  . Left bundle branch block 05/28/2013  . Essential hypertension 05/28/2013  . Hyperlipidemia 05/28/2013  . Renal artery stenosis ([place]) 05/28/2013  . Carotid bruit 05/28/2013    Past Surgical History:  Procedure Laterality Date  . CARDIAC CATHETERIZATION  03/21/04   essentially normal coronary arteries with moderate decrease in left ventricular function, 80% ostial left renal artery stenosis  . [place]    OB History    No data available       Home Medications    Prior to Admission medications   Medication Sig Start Date End Date Taking? Authorizing Provider  aspirin EC 81 MG tablet Take 81 mg by mouth daily.   Yes Historical  Provider, MD  buPROPion (WELLBUTRIN XL) 150 MG 24 hr tablet Take 450 mg by mouth daily.   Yes Historical Provider, MD  cholecalciferol (VITAMIN D) 1000 units tablet Take 1,000 Units by mouth daily.   Yes Historical Provider, MD  DULoxetine (CYMBALTA) 60 MG capsule Take 60 mg by mouth at bedtime.    Yes Historical Provider, MD  L-Methylfolate (DEPLIN) 7.5 MG TABS Take 7.5 mg by mouth daily.    Yes Historical Provider, MD  levothyroxine (SYNTHROID, LEVOTHROID) 50 MCG tablet Take 50 mcg by mouth at bedtime.    Yes Historical Provider, MD  lithium carbonate 150 MG capsule Take 150 mg by mouth daily.   Yes Historical Provider, MD  metoprolol succinate (TOPROL-XL) 25 MG 24 hr tablet Take 25 mg by mouth daily.   Yes Historical Provider, MD  paliperidone (INVEGA) 3 MG 24 hr tablet Take 3 mg by mouth at bedtime.    Yes Historical Provider, MD  polycarbophil (FIBERCON) 625 MG tablet Take 625 mg by mouth daily as needed for mild constipation or moderate constipation.   Yes Historical Provider, MD  ramipril (ALTACE) 10 MG capsule Take 10 mg by mouth at bedtime.   Yes Historical Provider, MD  simvastatin (ZOCOR) 10 MG tablet Take 10 mg by mouth daily.   Yes Historical Provider, MD  Family History Family History  Problem Relation Age of Onset  . Cancer Father   . Stroke Father   . Stroke Sister 66  . Diabetes Brother   . Heart attack Brother   . Diabetes Brother   . Heart disease Brother     Social History Social History  Substance Use Topics  . Smoking status: Never Smoker  . Smokeless tobacco: Never Used  . Alcohol use No     Allergies   Review of patient's allergies indicates no known allergies.   Review of Systems Review of Systems 10 Systems reviewed and are negative for acute change except as noted in the HPI.   Physical Exam Updated Vital Signs BP 142/70 (BP Location: Right Arm)   Pulse 71   Temp 98.2 F (36.8 C) (Oral)   Resp 16   SpO2 97%   Physical Exam    Constitutional: She is oriented to person, place, and time. She appears well-developed and well-nourished. No distress.  HENT:  Head: Normocephalic.  Large hematoma and abrasion central forehead w/ L periorbital ecchymosis; Moist mucous membranes  Eyes: Conjunctivae are normal. Pupils are equal, round, and reactive to light.  Neck: Normal range of motion. Neck supple.  No midline cervical spine tenderness  Cardiovascular: Normal rate, regular rhythm, normal heart sounds and intact distal pulses.   No murmur heard. Pulmonary/Chest: Effort normal and breath sounds normal. She exhibits no tenderness.  Abdominal: Soft. Bowel sounds are normal. She exhibits no distension. There is no tenderness.  Musculoskeletal: She exhibits no edema.  Normal ROM R arm; pain with abduction of L shoulder above 90 degrees; normal sensation x all 4 ext  Neurological: She is alert and oriented to person, place, and time. She exhibits normal muscle tone.  Fluent speech  Skin: Skin is warm and dry.  Skin tear with steristrips in place on R proximal forearm near elbow  Psychiatric: She has a normal mood and affect. Judgment normal.  Nursing note and vitals reviewed.    ED Treatments / Results  Labs (all labs ordered are listed, but only abnormal results are displayed) Labs Reviewed - No data to display  EKG  EKG Interpretation None       Radiology Ct Head Wo Contrast  Result Date: 06/29/2016 CLINICAL DATA:  Pain after fall.  Abrasion over left forehead. EXAM: CT HEAD WITHOUT CONTRAST CT MAXILLOFACIAL WITHOUT CONTRAST CT CERVICAL SPINE WITHOUT CONTRAST TECHNIQUE: Multidetector CT imaging of the head, cervical spine, and maxillofacial structures were performed using the standard protocol without intravenous contrast. Multiplanar CT image reconstructions of the cervical spine and maxillofacial structures were also generated. COMPARISON:  None. FINDINGS: CT HEAD FINDINGS Suggested minimal fluid in the right  maxillary sinus. Paranasal sinuses, mastoid air cells, and visualized bones are otherwise normal. Significant soft tissue swelling over the left forehead. Extracranial soft tissue are otherwise normal. No subdural, epidural, or subarachnoid hemorrhage. No mass, mass effect, or midline shift. Cerebellum and brainstem are normal. Basal cisterns are unremarkable. Ventricular and sulcal prominence, likely due to age related volume loss. No acute cortical ischemia or infarct is identified. No significant white matter changes given age. CT MAXILLOFACIAL FINDINGS There is soft tissue swelling over the left forehead. Other extracranial soft tissues including the orbits are unremarkable. Minimal fluid in the right maxillary sinus with a small amount of mucosal thickening as well. Paranasal sinuses, middle ears, and mastoid air cells are otherwise normal. No facial bone fractures are identified. CT CERVICAL SPINE FINDINGS No traumatic malalignment  identified. No fractures. Degenerative changes most marked at C5-6 with anterior and tiny posterior osteophytes. IMPRESSION: 1. Soft tissue swelling over the left side of the forehead. No acute intracranial process, facial bone fracture, or cervical spine fracture/ malalignment. There are degenerative changes in the cervical spine. Electronically Signed   By: Dorise Bullion III M.D   On: 06/29/2016 16:49   Ct Cervical Spine Wo Contrast  Result Date: 06/29/2016 CLINICAL DATA:  Pain after fall.  Abrasion over left forehead. EXAM: CT HEAD WITHOUT CONTRAST CT MAXILLOFACIAL WITHOUT CONTRAST CT CERVICAL SPINE WITHOUT CONTRAST TECHNIQUE: Multidetector CT imaging of the head, cervical spine, and maxillofacial structures were performed using the standard protocol without intravenous contrast. Multiplanar CT image reconstructions of the cervical spine and maxillofacial structures were also generated. COMPARISON:  None. FINDINGS: CT HEAD FINDINGS Suggested minimal fluid in the right  maxillary sinus. Paranasal sinuses, mastoid air cells, and visualized bones are otherwise normal. Significant soft tissue swelling over the left forehead. Extracranial soft tissue are otherwise normal. No subdural, epidural, or subarachnoid hemorrhage. No mass, mass effect, or midline shift. Cerebellum and brainstem are normal. Basal cisterns are unremarkable. Ventricular and sulcal prominence, likely due to age related volume loss. No acute cortical ischemia or infarct is identified. No significant white matter changes given age. CT MAXILLOFACIAL FINDINGS There is soft tissue swelling over the left forehead. Other extracranial soft tissues including the orbits are unremarkable. Minimal fluid in the right maxillary sinus with a small amount of mucosal thickening as well. Paranasal sinuses, middle ears, and mastoid air cells are otherwise normal. No facial bone fractures are identified. CT CERVICAL SPINE FINDINGS No traumatic malalignment identified. No fractures. Degenerative changes most marked at C5-6 with anterior and tiny posterior osteophytes. IMPRESSION: 1. Soft tissue swelling over the left side of the forehead. No acute intracranial process, facial bone fracture, or cervical spine fracture/ malalignment. There are degenerative changes in the cervical spine. Electronically Signed   By: Dorise Bullion III M.D   On: 06/29/2016 16:49   Dg Shoulder Left  Result Date: 06/29/2016 CLINICAL DATA:  Left shoulder pain following a fall today after tripping on carpet. EXAM: LEFT SHOULDER - 2+ VIEW COMPARISON:  None. FINDINGS: Minimal inferior glenohumeral spur formation. No fracture or dislocation seen. IMPRESSION: 1. No fracture or dislocation. 2. Minimal glenohumeral joint degenerative change. Electronically Signed   By: Claudie Revering M.D.   On: 06/29/2016 16:24   Ct Maxillofacial Wo Cm  Result Date: 06/29/2016 CLINICAL DATA:  Pain after fall.  Abrasion over left forehead. EXAM: CT HEAD WITHOUT CONTRAST CT  MAXILLOFACIAL WITHOUT CONTRAST CT CERVICAL SPINE WITHOUT CONTRAST TECHNIQUE: Multidetector CT imaging of the head, cervical spine, and maxillofacial structures were performed using the standard protocol without intravenous contrast. Multiplanar CT image reconstructions of the cervical spine and maxillofacial structures were also generated. COMPARISON:  None. FINDINGS: CT HEAD FINDINGS Suggested minimal fluid in the right maxillary sinus. Paranasal sinuses, mastoid air cells, and visualized bones are otherwise normal. Significant soft tissue swelling over the left forehead. Extracranial soft tissue are otherwise normal. No subdural, epidural, or subarachnoid hemorrhage. No mass, mass effect, or midline shift. Cerebellum and brainstem are normal. Basal cisterns are unremarkable. Ventricular and sulcal prominence, likely due to age related volume loss. No acute cortical ischemia or infarct is identified. No significant white matter changes given age. CT MAXILLOFACIAL FINDINGS There is soft tissue swelling over the left forehead. Other extracranial soft tissues including the orbits are unremarkable. Minimal fluid in the right maxillary  sinus with a small amount of mucosal thickening as well. Paranasal sinuses, middle ears, and mastoid air cells are otherwise normal. No facial bone fractures are identified. CT CERVICAL SPINE FINDINGS No traumatic malalignment identified. No fractures. Degenerative changes most marked at C5-6 with anterior and tiny posterior osteophytes. IMPRESSION: 1. Soft tissue swelling over the left side of the forehead. No acute intracranial process, facial bone fracture, or cervical spine fracture/ malalignment. There are degenerative changes in the cervical spine. Electronically Signed   By: Dorise Bullion III M.D   On: 06/29/2016 16:49    Procedures Procedures (including critical care time)  Medications Ordered in ED Medications - No data to display   Initial Impression / Assessment and  Plan / ED Course  I have reviewed the triage vital signs and the nursing notes.  Pertinent imaging results that were available during my care of the patient were reviewed by me and considered in my medical decision making (see chart for details).  Clinical Course   Patient presents with left forehead hematoma and right arm skin tear after a fall from standing. Also reporting left shoulder pain from a fall 1 week ago. The patient's skin tear had been managed by the clinic at her nursing facility with Steri-Strips in place. She was neurologically intact with symmetric extremity strength and no other complaints other than mild forehead and left shoulder pain. Obtained CT of head, face, and cervical spine to rule out injury. Also obtained plain film of left shoulder.  Imaging was negative for acute injury aside from forehead hematoma. Patient well-appearing on reexamination without any significant complaints. Discussed supportive care including ice, elevation of head of bed, and monitoring for any neurologic symptoms. Also discussed supportive care for skin tear. Instructed to follow-up with PCP in one week if her shoulder pain is not improved. Patient voiced understanding and discharged in satisfactory condition.  Final Clinical Impressions(s) / ED Diagnoses   Final diagnoses:  Fall, initial encounter  Head contusion, initial encounter  Skin tear of right elbow without complication, initial encounter  Left shoulder pain    New Prescriptions New Prescriptions   No medications on file     Sharlett Iles, MD 06/29/16 1732

## 2016-07-01 ENCOUNTER — Other Ambulatory Visit: Payer: Self-pay | Admitting: Cardiovascular Disease

## 2016-07-02 NOTE — Telephone Encounter (Signed)
Rx(s) sent to pharmacy electronically.  

## 2016-07-10 ENCOUNTER — Ambulatory Visit (INDEPENDENT_AMBULATORY_CARE_PROVIDER_SITE_OTHER): Payer: Medicare Other | Admitting: Physician Assistant

## 2016-07-10 ENCOUNTER — Ambulatory Visit (INDEPENDENT_AMBULATORY_CARE_PROVIDER_SITE_OTHER): Payer: Medicare Other

## 2016-07-10 ENCOUNTER — Encounter: Payer: Self-pay | Admitting: Physician Assistant

## 2016-07-10 ENCOUNTER — Emergency Department (HOSPITAL_COMMUNITY): Payer: Medicare Other

## 2016-07-10 ENCOUNTER — Emergency Department (HOSPITAL_COMMUNITY)
Admission: EM | Admit: 2016-07-10 | Discharge: 2016-07-10 | Disposition: A | Discharge status: Home / Self Care | Payer: Medicare Other | Attending: Emergency Medicine | Admitting: Emergency Medicine

## 2016-07-10 ENCOUNTER — Encounter (HOSPITAL_COMMUNITY): Payer: Self-pay

## 2016-07-10 VITALS — BP 144/80 | HR 116 | Temp 98.2°F | Resp 18 | Ht 62.0 in | Wt 132.8 lb

## 2016-07-10 DIAGNOSIS — I10 Essential (primary) hypertension: Secondary | ICD-10-CM | POA: Insufficient documentation

## 2016-07-10 DIAGNOSIS — N3289 Other specified disorders of bladder: Secondary | ICD-10-CM | POA: Insufficient documentation

## 2016-07-10 DIAGNOSIS — Z79899 Other long term (current) drug therapy: Secondary | ICD-10-CM | POA: Insufficient documentation

## 2016-07-10 DIAGNOSIS — K449 Diaphragmatic hernia without obstruction or gangrene: Secondary | ICD-10-CM | POA: Insufficient documentation

## 2016-07-10 DIAGNOSIS — M6281 Muscle weakness (generalized): Secondary | ICD-10-CM | POA: Insufficient documentation

## 2016-07-10 DIAGNOSIS — E039 Hypothyroidism, unspecified: Secondary | ICD-10-CM | POA: Diagnosis not present

## 2016-07-10 DIAGNOSIS — R000 Tachycardia, unspecified: Secondary | ICD-10-CM

## 2016-07-10 DIAGNOSIS — D72828 Other elevated white blood cell count: Secondary | ICD-10-CM | POA: Diagnosis not present

## 2016-07-10 DIAGNOSIS — Z7982 Long term (current) use of aspirin: Secondary | ICD-10-CM | POA: Diagnosis not present

## 2016-07-10 DIAGNOSIS — E860 Dehydration: Secondary | ICD-10-CM | POA: Diagnosis not present

## 2016-07-10 DIAGNOSIS — R197 Diarrhea, unspecified: Secondary | ICD-10-CM

## 2016-07-10 LAB — POCT URINALYSIS DIP (MANUAL ENTRY)
BILIRUBIN UA: NEGATIVE
GLUCOSE UA: NEGATIVE
Ketones, POC UA: NEGATIVE
Nitrite, UA: NEGATIVE
Protein Ur, POC: NEGATIVE
SPEC GRAV UA: 1.01
UROBILINOGEN UA: 0.2
pH, UA: 6

## 2016-07-10 LAB — POCT CBC
GRANULOCYTE PERCENT: 86 % — AB (ref 37–80)
HCT, POC: 33.9 % — AB (ref 37.7–47.9)
Hemoglobin: 11.7 g/dL — AB (ref 12.2–16.2)
Lymph, poc: 1.1 (ref 0.6–3.4)
MCH: 37.8 pg — AB (ref 27–31.2)
MCHC: 34.5 g/dL (ref 31.8–35.4)
MCV: 92.2 fL (ref 80–97)
MID (CBC): 0.3 (ref 0–0.9)
MPV: 7.2 fL (ref 0–99.8)
POC Granulocyte: 8.9 — AB (ref 2–6.9)
POC LYMPH PERCENT: 10.7 %L (ref 10–50)
POC MID %: 3.3 % (ref 0–12)
Platelet Count, POC: 369 10*3/uL (ref 142–424)
RBC: 3.68 M/uL — AB (ref 4.04–5.48)
RDW, POC: 13 %
WBC: 10.4 10*3/uL — AB (ref 4.6–10.2)

## 2016-07-10 LAB — POC MICROSCOPIC URINALYSIS (UMFC): Mucus: ABSENT

## 2016-07-10 LAB — BASIC METABOLIC PANEL
ANION GAP: 6 (ref 5–15)
BUN: 30 mg/dL — ABNORMAL HIGH (ref 6–20)
CHLORIDE: 111 mmol/L (ref 101–111)
CO2: 21 mmol/L — AB (ref 22–32)
Calcium: 9.2 mg/dL (ref 8.9–10.3)
Creatinine, Ser: 1.67 mg/dL — ABNORMAL HIGH (ref 0.44–1.00)
GFR calc non Af Amer: 26 mL/min — ABNORMAL LOW (ref >60)
GFR, EST AFRICAN AMERICAN: 30 mL/min — AB (ref >60)
GLUCOSE: 100 mg/dL — AB (ref 65–99)
Potassium: 4.1 mmol/L (ref 3.5–5.1)
Sodium: 138 mmol/L (ref 135–145)

## 2016-07-10 LAB — CBG MONITORING, ED: Glucose-Capillary: 121 mg/dL — ABNORMAL HIGH (ref 65–99)

## 2016-07-10 LAB — GLUCOSE, POCT (MANUAL RESULT ENTRY): POC Glucose: 123 mg/dl — AB (ref 70–99)

## 2016-07-10 MED ORDER — DIATRIZOATE MEGLUMINE & SODIUM 66-10 % PO SOLN
15.0000 mL | Freq: Once | ORAL | Status: AC
  Administered 2016-07-10: 15 mL via ORAL

## 2016-07-10 NOTE — Progress Notes (Signed)
07/10/2016 4:17 PM   DOB: 07/02/1923 / MRN: [id number]  SUBJECTIVE:  Lynn Hayes is a 82 y.o. female presenting for bilateral knee numbness.  She has a friend with her today who reports she did not get out of bed this morning and has not been going about her usual activities.  She had 2 falls in the last month and presented to the ED after the second fall.  Multiple films were taken and all of these were negative. Her friend reports she is walking normally at this time.  Depression screen PHQ 2/9 07/10/2016  Decreased Interest 0  Down, Depressed, Hopeless 0  PHQ - 2 Score 0   She has No Known Allergies.   She  has a past medical history of BBB (bundle branch block); Bipolar 1 disorder ([place]); GERD (gastroesophageal reflux disease); Hiatal hernia; Hyperlipidemia; Hypertension; Hypothyroid; Osteoarthritis; and Renal artery stenosis ([place]).    She  reports that she has never smoked. She has never used smokeless tobacco. She reports that she does not drink alcohol or use drugs. She  has no sexual activity history on file. The patient  has a past surgical history that includes Hemorrhoid surgery (1963) and Cardiac catheterization (03/21/04).  Her family history includes Cancer in her father; Diabetes in her brother and brother; Heart attack in her brother; Heart disease in her brother; Stroke in her father; Stroke (age of onset: 21) in her sister.  Review of Systems  Respiratory: Negative for cough.   Cardiovascular: Negative for chest pain and PND.  Genitourinary: Negative for dysuria, frequency and urgency.  Skin: Negative for rash.  Neurological: Negative for dizziness.    The problem list and medications were reviewed and updated by myself where necessary and exist elsewhere in the encounter.   OBJECTIVE:  BP (!) 144/80 (BP Location: Left Arm, Patient Position: Sitting, Cuff Size: Small)   Pulse (!) 116   Temp 98.2 F (36.8 C) (Oral)   Resp 18   Ht 5\' 2"  (1.575 m)   Wt 132 lb 12.8  oz (60.2 kg)   SpO2 96%   BMI 24.29 kg/m   Physical Exam  Constitutional: She is oriented to person, place, and time. She appears well-developed and well-nourished.  Non-toxic appearance. She appears ill.  HENT:  Head:    Right Ear: Tympanic membrane normal.  Left Ear: Tympanic membrane normal.  Nose: Nose normal.  Mouth/Throat: Oropharynx is clear and moist. No oropharyngeal exudate.  Cardiovascular: Regular rhythm and intact distal pulses.   No extrasystoles are present. Tachycardia present.  PMI is not displaced.  Exam reveals no gallop, no friction rub and no decreased pulses.   No murmur heard. Pulmonary/Chest: Effort normal and breath sounds normal.  Abdominal: Soft. Bowel sounds are normal.  Musculoskeletal: Normal range of motion.       Right hip: She exhibits no tenderness.       Left hip: She exhibits no tenderness.       Right ankle: She exhibits no swelling.       Left ankle: She exhibits no swelling.  Neurological: She is alert and oriented to person, place, and time. She displays no atrophy, no tremor and normal reflexes. No cranial nerve deficit or sensory deficit. She exhibits normal muscle tone. She displays no seizure activity. Coordination and gait normal. GCS eye subscore is 4. GCS verbal subscore is 5. GCS motor subscore is 6.  Reflex Scores:      Patellar reflexes are 2+ on the right side  and 2+ on the left side.      Achilles reflexes are 2+ on the right side and 2+ on the left side. Skin: Skin is warm and dry. No rash noted. No erythema. No pallor.  Psychiatric: She has a normal mood and affect.   Results for orders placed or performed in visit on 07/10/16 (from the past 72 hour(s))  POCT glucose (manual entry)     Status: Abnormal   Collection Time: 07/10/16  2:56 PM  Result Value Ref Range   POC Glucose 123 (A) 70 - 99 mg/dl  POCT CBC     Status: Abnormal   Collection Time: 07/10/16  2:57 PM  Result Value Ref Range   WBC 10.4 (A) 4.6 - 10.2 K/uL    Lymph, poc 1.1 0.6 - 3.4   POC LYMPH PERCENT 10.7 10 - 50 %L   MID (cbc) 0.3 0 - 0.9   POC MID % 3.3 0 - 12 %M   POC Granulocyte 8.9 (A) 2 - 6.9   Granulocyte percent 86.0 (A) 37 - 80 %G   RBC 3.68 (A) 4.04 - 5.48 M/uL   Hemoglobin 11.7 (A) 12.2 - 16.2 g/dL   HCT, POC 33.9 (A) 37.7 - 47.9 %   MCV 92.2 80 - 97 fL   MCH, POC 37.8 (A) 27 - 31.2 pg   MCHC 34.5 31.8 - 35.4 g/dL   RDW, POC 13.0 %   Platelet Count, POC 369 142 - 424 K/uL   MPV 7.2 0 - 99.8 fL  POCT urinalysis dipstick     Status: Abnormal   Collection Time: 07/10/16  3:21 PM  Result Value Ref Range   Color, UA yellow yellow   Clarity, UA clear clear   Glucose, UA negative negative   Bilirubin, UA negative negative   Ketones, POC UA negative negative   Spec Grav, UA 1.010    Blood, UA moderate (A) negative   pH, UA 6.0    Protein Ur, POC negative negative   Urobilinogen, UA 0.2    Nitrite, UA Negative Negative   Leukocytes, UA small (1+) (A) Negative  POCT Microscopic Urinalysis ([facility])     Status: Abnormal   Collection Time: 07/10/16  3:32 PM  Result Value Ref Range   WBC,UR,HPF,POC Many (A) None WBC/hpf   RBC,UR,HPF,POC None None RBC/hpf   Bacteria None None, Too numerous to count   Mucus Absent Absent   Epithelial Cells, UR Per Microscopy Many (A) None, Too numerous to count cells/hpf    Dg Chest 2 View  Result Date: 07/10/2016 CLINICAL DATA:  Elevated white blood cell count, tachycardia, history of hypertension EXAM: CHEST  2 VIEW COMPARISON:  None in PACs FINDINGS: The lungs are mildly hyperinflated with hemidiaphragm flattening. There is no focal infiltrate. There is no pleural effusion or pneumothorax. The heart and pulmonary vascularity are normal. There is calcification of the mitral valvular annulus. There is calcification in the wall of the thoracic aorta. The bony thorax is unremarkable. IMPRESSION: 1. COPD.  No evidence of pneumonia nor lymphadenopathy. 2. Aortic atherosclerosis. Electronically Signed    By: David  Martinique M.D.   On: 07/10/2016 15:54    ASSESSMENT AND PLAN  Kasity was seen today for fall.  Diagnoses and all orders for this visit:  Tachycardia -     POCT glucose (manual entry) -     POCT urinalysis dipstick -     POCT Microscopic Urinalysis ([facility]) -     POCT CBC -  DG Chest 2 View; Future -     Insert peripheral IV  Granulocytosis: It certainly appears that she is heading in a septic direction. I can find no source of infection. She will not do well at home.  I am sending her to [facility] for further eval and management, and possible admission.    The patient is advised to call or return to clinic if she does not see an improvement in symptoms, or to seek the care of the closest emergency department if she worsens with the above plan.   Philis Fendt, MHS, PA-C [facility] 07/10/2016 4:17 PM

## 2016-07-10 NOTE — ED Provider Notes (Signed)
[facility] DEPT Provider Note   CSN: [id number] Arrival date & time: 07/10/16  1712     History   Chief Complaint Chief Complaint  Patient presents with  . Weakness  . Diarrhea    HPI Lynn Hayes is a 82 y.o. female.  The history is provided by the patient.    CC: fatigue  Onset/Duration: started last night Timing: constant Location: generalized Quality: "weak" Severity: moderate Modifying Factors:  Improved by: nothing tried  Worsened by: nothing Associated Signs/Symptoms:  Pertinent (+): diarrhea this am (recurrent for several years that fluctuates every month or so). Pt also fell 10 days ago and had negative evaluation here. States she has been fine since until last night  Pertinent (-): fevers, chills, nausea, chest pain, sob, abd pain, GU sx, leg swelling Context: was seen at [facility] where they performed   Past Medical History:  Diagnosis Date  . BBB (bundle branch block)    left, chronic  . Bipolar 1 disorder ([place])   . GERD (gastroesophageal reflux disease)   . Hiatal hernia   . Hyperlipidemia   . Hypertension   . Hypothyroid   . Osteoarthritis   . Renal artery stenosis [facility])    left    Patient Active Problem List   Diagnosis Date Noted  . Chest pain 04/05/2014  . Left bundle branch block 05/28/2013  . Essential hypertension 05/28/2013  . Hyperlipidemia 05/28/2013  . Renal artery stenosis ([place]) 05/28/2013  . Carotid bruit 05/28/2013    Past Surgical History:  Procedure Laterality Date  . CARDIAC CATHETERIZATION  03/21/04   essentially normal coronary arteries with moderate decrease in left ventricular function, 80% ostial left renal artery stenosis  . [place]    OB History    No data available       Home Medications    Prior to Admission medications   Medication Sig Start Date End Date Taking? Authorizing Provider  aspirin EC 81 MG tablet Take 81 mg by mouth daily.   Yes Historical Provider, MD  buPROPion  (WELLBUTRIN XL) 150 MG 24 hr tablet Take 450 mg by mouth daily.   Yes Historical Provider, MD  cholecalciferol (VITAMIN D) 1000 units tablet Take 1,000 Units by mouth daily.   Yes Historical Provider, MD  DULoxetine (CYMBALTA) 60 MG capsule Take 60 mg by mouth at bedtime.    Yes Historical Provider, MD  L-Methylfolate (DEPLIN) 7.5 MG TABS Take 7.5 mg by mouth daily.    Yes Historical Provider, MD  levothyroxine (SYNTHROID, LEVOTHROID) 50 MCG tablet Take 50 mcg by mouth at bedtime.    Yes Historical Provider, MD  lithium carbonate 150 MG capsule Take 150 mg by mouth daily.   Yes Historical Provider, MD  metoprolol succinate (TOPROL-XL) 25 MG 24 hr tablet Take 25 mg by mouth daily.   Yes Historical Provider, MD  paliperidone (INVEGA) 3 MG 24 hr tablet Take 3 mg by mouth at bedtime.    Yes Historical Provider, MD  ramipril (ALTACE) 10 MG capsule Take 10 mg by mouth at bedtime.   Yes Historical Provider, MD  simvastatin (ZOCOR) 10 MG tablet TAKE 1 TABLET (10 MG TOTAL) BY MOUTH AT BEDTIME. 07/02/16  Yes Lorretta Harp, MD  polycarbophil (FIBERCON) 625 MG tablet Take 625 mg by mouth daily as needed for mild constipation or moderate constipation.    Historical Provider, MD    Family History Family History  Problem Relation Age of Onset  . Cancer Father   .  Stroke Father   . Stroke Sister 74  . Diabetes Brother   . Heart attack Brother   . Diabetes Brother   . Heart disease Brother     Social History Social History  Substance Use Topics  . Smoking status: Never Smoker  . Smokeless tobacco: Never Used  . Alcohol use No     Allergies   Review of patient's allergies indicates no known allergies.   Review of Systems Review of Systems  Constitutional: Negative for appetite change, chills, fatigue and fever.  HENT: Negative for congestion, ear pain and sore throat.   Eyes: Negative for visual disturbance.  Respiratory: Negative for cough, chest tightness and shortness of breath.     Cardiovascular: Negative for chest pain and palpitations.  Gastrointestinal: Positive for diarrhea. Negative for abdominal pain, blood in stool, nausea and vomiting.  Endocrine: Negative for cold intolerance and heat intolerance.  Genitourinary: Negative for decreased urine volume, difficulty urinating and frequency.  Musculoskeletal: Negative for back pain and neck stiffness.  Skin: Negative for rash.  Neurological: Negative for dizziness, weakness and light-headedness.  All other systems reviewed and are negative.    Physical Exam Updated Vital Signs BP 178/56 (BP Location: Right Arm)  Pulse: 88 I  Temp 98.5 F (36.9 C) (Oral)   Resp 17   SpO2 99%   Physical Exam  Constitutional: She is oriented to person, place, and time. She appears well-developed and well-nourished. No distress.  HENT:  Head: Normocephalic. Head is with contusion (on left face; remote; resolving).  Right Ear: External ear normal.  Left Ear: External ear normal.  Nose: Nose normal.  Eyes: Conjunctivae and EOM are normal. Pupils are equal, round, and reactive to light. Right eye exhibits no discharge. Left eye exhibits no discharge. No scleral icterus.  Neck: Normal range of motion. Neck supple.  Cardiovascular: Normal rate, regular rhythm and normal heart sounds.  Exam reveals no gallop and no friction rub.   No murmur heard. Pulmonary/Chest: Effort normal and breath sounds normal. No stridor. No respiratory distress. She has no wheezes.  Abdominal: Soft. She exhibits no distension. There is no tenderness.  Musculoskeletal: She exhibits no edema or tenderness.  Neurological: She is alert and oriented to person, place, and time.  Skin: Skin is warm and dry. No rash noted. She is not diaphoretic. No erythema.  Psychiatric: She has a normal mood and affect.     ED Treatments / Results  Labs (all labs ordered are listed, but only abnormal results are displayed) Labs Reviewed  BASIC METABOLIC PANEL -  Abnormal; Notable for the following:       Result Value   CO2 21 (*)    Glucose, Bld 100 (*)    BUN 30 (*)    Creatinine, Ser 1.67 (*)    GFR calc non Af Amer 26 (*)    GFR calc Af Amer 30 (*)    All other components within normal limits  CBG MONITORING, ED - Abnormal; Notable for the following:    Glucose-Capillary 121 (*)    All other components within normal limits    EKG  EKG Interpretation  Date/Time:  Tuesday July 10 2016 18:22:43 EDT Ventricular Rate:  90 PR Interval:    QRS Duration: 137 QT Interval:  397 QTC Calculation: 486 R Axis:   8 Text Interpretation:  Sinus rhythm Left bundle branch block no prior tracing to compare Confirmed by [facility] MD, [place] ([id number]) on 07/11/2016 1:48:06 AM  Radiology Ct Abdomen Pelvis Wo Contrast  Result Date: 07/10/2016 CLINICAL DATA:  Generalized weakness starting last night with diarrhea starting this morning. EXAM: CT ABDOMEN AND PELVIS WITHOUT CONTRAST TECHNIQUE: Multidetector CT imaging of the abdomen and pelvis was performed following the standard protocol without IV contrast. COMPARISON:  09/02/2008 FINDINGS: Lower chest:  Scattered areas of small airway impaction noted. Hepatobiliary: No focal abnormality in the liver on this study without intravenous contrast. There is no evidence for gallstones, gallbladder wall thickening, or pericholecystic fluid. No intrahepatic or extrahepatic biliary dilation. Pancreas: No focal mass lesion. No dilatation of the main duct. No intraparenchymal cyst. No peripancreatic edema. Spleen: No splenomegaly. No focal mass lesion. Adrenals/Urinary Tract: No adrenal nodule or mass. Kidneys are unremarkable on this noncontrast exam. No evidence for hydroureter. Bladder is mildly distended. Stomach/Bowel: Small hiatal hernia. Stomach otherwise unremarkable. Duodenum is normally positioned as is the ligament of Treitz. No small bowel wall thickening. No small bowel dilatation. The terminal ileum is normal.  The appendix is not visualized, but there is no edema or inflammation in the region of the cecum. Diverticular changes are noted in the left colon without evidence of diverticulitis. Vascular/Lymphatic: There is abdominal aortic atherosclerosis without aneurysm. There is no gastrohepatic or hepatoduodenal ligament lymphadenopathy. No intraperitoneal or retroperitoneal lymphadenopathy. No pelvic sidewall lymphadenopathy. Reproductive: The uterus has normal CT imaging appearance. There is no adnexal mass. Other: No intraperitoneal free fluid. Musculoskeletal: Bone windows reveal no worrisome lytic or sclerotic osseous lesions. IMPRESSION: 1. No acute findings in the abdomen or pelvis. Specifically, no findings to explain the patient's history weakness and diarrhea. There is no evidence for diverticulitis. No pericolonic edema or colonic wall thickening to suggest colitis. Electronically Signed   By: Misty Stanley M.D.   On: 07/10/2016 20:17   Dg Chest 2 View  Result Date: 07/10/2016 CLINICAL DATA:  Elevated white blood cell count, tachycardia, history of hypertension EXAM: CHEST  2 VIEW COMPARISON:  None in PACs FINDINGS: The lungs are mildly hyperinflated with hemidiaphragm flattening. There is no focal infiltrate. There is no pleural effusion or pneumothorax. The heart and pulmonary vascularity are normal. There is calcification of the mitral valvular annulus. There is calcification in the wall of the thoracic aorta. The bony thorax is unremarkable. IMPRESSION: 1. COPD.  No evidence of pneumonia nor lymphadenopathy. 2. Aortic atherosclerosis. Electronically Signed   By: David  Martinique M.D.   On: 07/10/2016 15:54    Procedures Procedures (including critical care time)  Medications Ordered in ED Medications  diatrizoate meglumine-sodium (GASTROGRAFIN) 66-10 % solution 15 mL (15 mLs Oral Given 07/10/16 1838)     Initial Impression / Assessment and Plan / ED Course  I have reviewed the triage vital signs  and the nursing notes.  Pertinent labs & imaging results that were available during my care of the patient were reviewed by me and considered in my medical decision making (see chart for details).  Clinical Course    BMP to assess for any electrolyte derangements. No significant derangements noted. We did note slight worsening in the patient's renal function which may be secondary to dehydration. The patient was provided with IV fluids.  CT obtained given slight leukocytosis on previous workup in urgent care which revealed no evidence of acute intra-abdominal inflammatory or infectious process.  Patient lives in an independent living facility that has resources for assisted living per the daughter. Feel the patient is safe for discharge with strict return precautions. She was instructed to follow up closely  with her primary care provider.  Final Clinical Impressions(s) / ED Diagnoses   Final diagnoses:  Dehydration  Diarrhea, unspecified type   Disposition: Discharge  Condition: Good  I have discussed the results, Dx and Tx plan with the patient who expressed understanding and agree(s) with the plan. Discharge instructions discussed at great length. The patient was given strict return precautions who verbalized understanding of the instructions. No further questions at time of discharge.    Discharge Medication List as of 07/10/2016  9:29 PM      Follow Up: Burnard Bunting, MD [place] [id number] [phone]  Schedule an appointment as soon as possible for a visit in 2 days for repeat renal function panel given your slight decrease in renal function.      Fatima Blank, MD 07/11/16 [phone]

## 2016-07-10 NOTE — ED Triage Notes (Signed)
Per EMS, Pt, from Urgent Care, c/o generalized weakness starting last night and diarrhea starting this morning.  Pt bruising to L side face and L shoulder discomfort r/t fall x 11 days ago.  Pain score 5/10.  Pt has been seen at [facility] previously for fall complaint.    Per EMS, lab work and UA completed at Urgent Care.

## 2016-07-10 NOTE — Patient Instructions (Signed)
     IF you received an x-ray today, you will receive an invoice from [facility]. Please contact [facility] at [phone] with questions or concerns regarding your invoice.   IF you received labwork today, you will receive an invoice from [facility]. Please contact [facility] at [phone] with questions or concerns regarding your invoice.   Our billing staff will not be able to assist you with questions regarding bills from these companies.  You will be contacted with the lab results as soon as they are available. The fastest way to get your results is to activate your My Chart account. Instructions are located on the last page of this paperwork. If you have not heard from us regarding the results in 2 weeks, please contact this office.      

## 2016-07-31 ENCOUNTER — Encounter: Payer: Medicare Other | Admitting: Internal Medicine

## 2016-09-04 ENCOUNTER — Other Ambulatory Visit: Payer: Self-pay | Admitting: Cardiovascular Disease

## 2016-10-08 ENCOUNTER — Other Ambulatory Visit: Payer: Self-pay | Admitting: Cardiovascular Disease

## 2016-10-08 NOTE — Telephone Encounter (Signed)
REFILL 

## 2016-11-07 ENCOUNTER — Ambulatory Visit (INDEPENDENT_AMBULATORY_CARE_PROVIDER_SITE_OTHER): Payer: Medicare Other | Admitting: Internal Medicine

## 2016-11-07 ENCOUNTER — Encounter: Payer: Self-pay | Admitting: Internal Medicine

## 2016-11-07 VITALS — BP 132/68 | HR 75 | Temp 98.0°F | Ht 62.0 in | Wt 134.2 lb

## 2016-11-07 DIAGNOSIS — I359 Nonrheumatic aortic valve disorder, unspecified: Secondary | ICD-10-CM | POA: Diagnosis not present

## 2016-11-07 DIAGNOSIS — N183 Chronic kidney disease, stage 3 unspecified: Secondary | ICD-10-CM

## 2016-11-07 DIAGNOSIS — I10 Essential (primary) hypertension: Secondary | ICD-10-CM | POA: Diagnosis not present

## 2016-11-07 DIAGNOSIS — E034 Atrophy of thyroid (acquired): Secondary | ICD-10-CM | POA: Diagnosis not present

## 2016-11-07 DIAGNOSIS — F319 Bipolar disorder, unspecified: Secondary | ICD-10-CM | POA: Diagnosis not present

## 2016-11-07 DIAGNOSIS — E039 Hypothyroidism, unspecified: Secondary | ICD-10-CM | POA: Insufficient documentation

## 2016-11-07 DIAGNOSIS — E782 Mixed hyperlipidemia: Secondary | ICD-10-CM

## 2016-11-07 DIAGNOSIS — N184 Chronic kidney disease, stage 4 (severe): Secondary | ICD-10-CM | POA: Insufficient documentation

## 2016-11-07 LAB — CBC WITH DIFFERENTIAL/PLATELET
BASOS PCT: 1 %
Basophils Absolute: 64 cells/uL (ref 0–200)
Eosinophils Absolute: 192 cells/uL (ref 15–500)
Eosinophils Relative: 3 %
HCT: 31 % — ABNORMAL LOW (ref 35.0–45.0)
Hemoglobin: 9.8 g/dL — ABNORMAL LOW (ref 11.7–15.5)
LYMPHS PCT: 16 %
Lymphs Abs: 1024 cells/uL (ref 850–3900)
MCH: 30.7 pg (ref 27.0–33.0)
MCHC: 31.6 g/dL — AB (ref 32.0–36.0)
MCV: 97.2 fL (ref 80.0–100.0)
MONOS PCT: 7 %
MPV: 9.2 fL (ref 7.5–12.5)
Monocytes Absolute: 448 cells/uL (ref 200–950)
NEUTROS PCT: 73 %
Neutro Abs: 4672 cells/uL (ref 1500–7800)
PLATELETS: 302 10*3/uL (ref 140–400)
RBC: 3.19 MIL/uL — AB (ref 3.80–5.10)
RDW: 13.2 % (ref 11.0–15.0)
WBC: 6.4 10*3/uL (ref 3.8–10.8)

## 2016-11-07 NOTE — Patient Instructions (Signed)
Continue current medications as ordered  Will call with lab results  Follow up in 3 mos with Dr Nyoka Cowden at [facility].

## 2016-11-07 NOTE — Progress Notes (Signed)
Patient ID: Lynn Hayes, female   DOB: 02-05-1923, 82 y.o.   MRN: [id number]    Location:  [facility] Place of Service: OFFICE    Advanced Directive information Does Patient Have a Medical Advance Directive?: Yes, Type of Advance Directive: Healthcare Power of Attorney  Chief Complaint  Patient presents with  . Establish Care    New patient to establish care    HPI:  82 yo female seen today as a new pt. She resides at [facility] and will be a pt of Dr Rolly Salter. Previous PCP Dr Reynaldo Minium. She is changing to facility provider, Dr Nyoka Cowden. She has no concerns today. She had her flu shot already. She is a poor historian due to psych d/o. Hx obtained from chart  Aortic valve d/o - followed by cardio Dr Alvester Chou. Takes BB, ACEI  Bipolar d/o/depression/anxiety - stable on invega, deplin, lithium, cymbalta and wellbutrin. Followed by psych Dr Reece Levy  Hyperlipidemia - takes zocor. No myalgias  HTN- BP stable on toprol xl and altace. Takes ASA daily  Hypothyroidism - takes synthroid 50 mcg daily.  She takes vitamins daily  Hx GERD/HH - stable off meds  Hx CKD - stage 4; Cr 1.67  Past Medical History:  Diagnosis Date  . Anxiety and depression   . Aortic valve disease   . BBB (bundle branch block)    left, chronic  . Bipolar 1 disorder ([place])   . CKD (chronic kidney disease), stage III   . GERD (gastroesophageal reflux disease)   . Herpes zoster   . Hiatal hernia   . Hyperlipidemia   . Hypertension   . Hypertensive CKD (chronic kidney disease)   . Hypothyroid   . Osteoarthritis   . Osteopenia   . Renal artery stenosis ([facility])    left    Past Surgical History:  Procedure Laterality Date  . CARDIAC CATHETERIZATION  03/21/04   essentially normal coronary arteries with moderate decrease in left ventricular function, 80% ostial left renal artery stenosis  . COLONOSCOPY  11/11/2006   Dr. Scarlette Shorts , diverticulosis, colon polyps  . [place]  . PANENDOSCOPY  11/11/2006    Dr. Scarlette Shorts, esophageal stricture s/p balloon dilation, GERD, Zenker's Diverticulum    Patient Care Team: Burnard Bunting, MD as PCP - General (Internal Medicine) Lorretta Harp, MD as Consulting Physician (Cardiology) Aquilla Hacker, MD as Referring Physician (Psychiatry)  Social History   Social History  . Marital status: Widowed    Spouse name: N/A  . Number of children: 1  . Years of education: N/A   Occupational History  . retired Network engineer     Social History Main Topics  . Smoking status: Never Smoker  . Smokeless tobacco: Never Used  . Alcohol use No  . Drug use: No  . Sexual activity: No   Other Topics Concern  . Not on file   Social History Narrative   Lives at [facility]   Widowed   Never smoked   Alcohol none   Exercise 3 times a week   POA      Diet?       Do you drink/eat things with caffeine?  Yes      Marital status? married                                   What year were you married? [place]  Do you live in a house, apartment, assisted living, condo, trailer, etc.? [place]      Is it one or more stories? Yes (elevators)      How many persons live in your home? 2      Do you have any pets in your home? (please list) none      Current or past profession: Network engineer      Do you exercise?     yes                                 Type & how often? 3x week      Do you have a living will? yes      Do you have a DNR form?   yes                               If not, do you want to discuss one?      Do you have signed POA/HPOA for forms?         reports that she has never smoked. She has never used smokeless tobacco. She reports that she does not drink alcohol or use drugs.  Family History  Problem Relation Age of Onset  . Cancer Father   . Stroke Father   . Stroke Sister 31  . Diabetes Brother   . Heart attack Brother   . Appendicitis Mother   . Diabetes Brother   . Heart disease Brother    Family Status    Relation Status  . Father Deceased at age 48   cancer and stroke  . Sister Deceased at age 9  . Brother Alive  . Mother Deceased at age 55   ruptured appendix  . Brother Deceased  . Sister Alive  . Maternal Grandmother Deceased  . Maternal Grandfather Deceased  . Paternal Grandmother Deceased  . Paternal Grandfather Deceased  . Daughter Alive    Immunization History  Administered Date(s) Administered  . Influenza-Unspecified 08/09/2009, 08/28/2010, 09/11/2011, 09/09/2012, 08/27/2013, 11/26/2013, 09/07/2014, 08/27/2015, 08/08/2016  . Pneumococcal Conjugate-13 06/10/2014  . Pneumococcal Polysaccharide-23 01/16/2010, 08/28/2010, 09/11/2011  . Pneumococcal-Unspecified 08/27/2015  . Td 08/03/2008  . Zoster 01/16/2010, 07/30/2011    No Known Allergies  Medications: Patient's Medications  New Prescriptions   No medications on file  Previous Medications   ASPIRIN EC 81 MG TABLET    Take 81 mg by mouth daily.   BUPROPION (WELLBUTRIN XL) 150 MG 24 HR TABLET    Take 450 mg by mouth daily.   CHOLECALCIFEROL (VITAMIN D) 1000 UNITS TABLET    Take 1,000 Units by mouth daily.   DULOXETINE (CYMBALTA) 60 MG CAPSULE    Take 60 mg by mouth at bedtime.    L-METHYLFOLATE (DEPLIN) 7.5 MG TABS    Take 7.5 mg by mouth daily.    LEVOTHYROXINE (SYNTHROID, LEVOTHROID) 50 MCG TABLET    Take 50 mcg by mouth at bedtime.    LITHIUM CARBONATE 150 MG CAPSULE    Take 150 mg by mouth daily.   METOPROLOL SUCCINATE (TOPROL-XL) 25 MG 24 HR TABLET    TAKE 1 TABLET (25 MG TOTAL) BY MOUTH DAILY.   PALIPERIDONE (INVEGA) 3 MG 24 HR TABLET    Take 3 mg by mouth every morning.    RAMIPRIL (ALTACE) 10 MG CAPSULE    TAKE 1 CAPSULE (10 MG TOTAL) BY  MOUTH DAILY.   SIMVASTATIN (ZOCOR) 10 MG TABLET    TAKE 1 TABLET (10 MG TOTAL) BY MOUTH AT BEDTIME.  Modified Medications   No medications on file  Discontinued Medications   POLYCARBOPHIL (FIBERCON) 625 MG TABLET    Take 625 mg by mouth daily as needed for mild  constipation or moderate constipation.    Review of Systems  HENT: Positive for hearing loss (uses hearing aids).   Psychiatric/Behavioral: Positive for dysphoric mood.  All other systems reviewed and are negative.   Vitals:   11/07/16 1108  BP: 132/68  Pulse: 75  Temp: 98 F (36.7 C)  TempSrc: Oral  SpO2: 96%  Weight: 134 lb 3.2 oz (60.9 kg)  Height: '5\' 2"'  (1.575 m)   Body mass index is 24.55 kg/m.  Physical Exam  Constitutional: She is oriented to person, place, and time. She appears well-developed and well-nourished.  HENT:  Mouth/Throat: Oropharynx is clear and moist. No oropharyngeal exudate.  Eyes: Pupils are equal, round, and reactive to light. No scleral icterus.  Neck: Neck supple. Carotid bruit is not present. No tracheal deviation present. No thyromegaly present.  Cardiovascular: Normal rate, regular rhythm and intact distal pulses.  Exam reveals no gallop and no friction rub.   Murmur (1/6 SEM with aortic click) heard. No LE edema b/l. no calf TTP.   Pulmonary/Chest: Effort normal and breath sounds normal. No stridor. No respiratory distress. She has no wheezes. She has no rales.  Abdominal: Soft. Bowel sounds are normal. She exhibits no distension and no mass. There is no hepatomegaly. There is no tenderness. There is no rebound and no guarding.  Musculoskeletal: She exhibits edema (small and large joints).  Lymphadenopathy:    She has no cervical adenopathy.  Neurological: She is alert and oriented to person, place, and time.  Skin: Skin is warm and dry. No rash noted.  Psychiatric: Her behavior is normal. Thought content normal. She exhibits a depressed mood.     Labs reviewed: No visits with results within 3 Month(s) from this visit.  Latest known visit with results is:  Abstract on 07/26/2016  Component Date Value Ref Range Status  . Microalb, Ur 06/16/2015 8.0   Final  . Hemoglobin 06/14/2015 12.1  12.0 - 16.0 g/dL Final  . HCT 06/14/2015 37  36 -  46 % Final  . Platelets 06/14/2015 385  150 - 399 K/L Final  . WBC 06/14/2015 7.2  10^3/mL Final  . Glucose 06/14/2015 106  mg/dL Final  . BUN 06/14/2015 26* 4 - 21 mg/dL Final  . Creatinine 06/14/2015 1.6* 0.5 - 1.1 mg/dL Final  . Potassium 06/14/2015 4.9  3.4 - 5.3 mmol/L Final  . Sodium 06/14/2015 143  137 - 147 mmol/L Final  . Triglycerides 06/14/2015 111  40 - 160 mg/dL Final  . Cholesterol 06/14/2015 184  0 - 200 mg/dL Final  . HDL 06/14/2015 67  35 - 70 mg/dL Final  . LDL Cholesterol 06/14/2015 95  mg/dL Final  . Alkaline Phosphatase 06/14/2015 63  25 - 125 U/L Final  . ALT 06/14/2015 21  7 - 35 U/L Final  . AST 06/14/2015 19  13 - 35 U/L Final  . Bilirubin, Total 06/14/2015 0.4  mg/dL Final  . TSH 06/14/2015 1.73  0.41 - 5.90 uIU/mL Final  . HM Mammogram 06/19/2012 0-4 [facility]  0-4 [facility], Self Reported Normal Final  . HM Colonoscopy 11/11/2006 See Report (in chart)  See Report (in chart), Patient Reported Final  No results found.   Assessment/Plan   ICD-9-CM ICD-10-CM   1. Essential hypertension 401.9 I10 CBC with Differential/Platelets     CMP with eGFR  2. Mixed hyperlipidemia 272.2 E78.2 CMP with eGFR     Lipid Panel  3. Aortic valve disorder 424.1 I35.9 CBC with Differential/Platelets  4. Bipolar affective disorder, remission status unspecified ([place]) 296.80 F31.9 CMP with eGFR  5. Stage 3 chronic kidney disease 585.3 N18.3 CBC with Differential/Platelets  6. Hypothyroidism due to acquired atrophy of thyroid 244.8 E03.4 TSH   246.8  T4, Free   Continue current medications as ordered  Will call with lab results  F/u with psych as scheduled  Follow up in 3 mos with Dr Nyoka Cowden at [place]. Perlie Gold  [facility] : [facility] [address] [place], [place] [address] [phone] Cell (Monday-Friday 8 AM - 5 PM) [phone] After 5 PM and follow prompts

## 2016-11-08 LAB — LIPID PANEL
CHOL/HDL RATIO: 2 ratio (ref <5.0)
CHOLESTEROL: 144 mg/dL (ref <200)
HDL: 71 mg/dL (ref >50)
LDL CALC: 59 mg/dL (ref <100)
TRIGLYCERIDES: 69 mg/dL (ref <150)
VLDL: 14 mg/dL (ref <30)

## 2016-11-08 LAB — COMPLETE METABOLIC PANEL WITH GFR
ALT: 14 U/L (ref 6–29)
AST: 17 U/L (ref 10–35)
Albumin: 3.8 g/dL (ref 3.6–5.1)
Alkaline Phosphatase: 49 U/L (ref 33–130)
BUN: 36 mg/dL — ABNORMAL HIGH (ref 7–25)
CALCIUM: 9.1 mg/dL (ref 8.6–10.4)
CHLORIDE: 110 mmol/L (ref 98–110)
CO2: 27 mmol/L (ref 20–31)
CREATININE: 1.8 mg/dL — AB (ref 0.60–0.88)
GFR, EST AFRICAN AMERICAN: 28 mL/min — AB (ref >=60)
GFR, Est Non African American: 24 mL/min — ABNORMAL LOW (ref >=60)
Glucose, Bld: 111 mg/dL — ABNORMAL HIGH (ref 65–99)
POTASSIUM: 5.1 mmol/L (ref 3.5–5.3)
Sodium: 140 mmol/L (ref 135–146)
Total Bilirubin: 0.3 mg/dL (ref 0.2–1.2)
Total Protein: 5.9 g/dL — ABNORMAL LOW (ref 6.1–8.1)

## 2016-11-08 LAB — TSH: TSH: 2.43 mIU/L

## 2016-11-08 LAB — T4, FREE: Free T4: 1 ng/dL (ref 0.8–1.8)

## 2016-11-21 ENCOUNTER — Other Ambulatory Visit: Payer: Self-pay | Admitting: *Deleted

## 2016-11-21 ENCOUNTER — Other Ambulatory Visit: Payer: Self-pay

## 2016-11-21 DIAGNOSIS — Z1211 Encounter for screening for malignant neoplasm of colon: Secondary | ICD-10-CM

## 2016-11-21 MED ORDER — LEVOTHYROXINE SODIUM 50 MCG PO TABS: ORAL_TABLET | ORAL | 1 refills | Status: DC

## 2016-11-21 NOTE — Telephone Encounter (Signed)
Patient daughter, Shauna Hugh requested refill to be sent to pharmacy.

## 2016-12-18 ENCOUNTER — Emergency Department (HOSPITAL_COMMUNITY)
Admission: EM | Admit: 2016-12-18 | Discharge: 2016-12-18 | Disposition: A | Discharge status: Home / Self Care | Payer: Medicare Other | Attending: Emergency Medicine | Admitting: Emergency Medicine

## 2016-12-18 ENCOUNTER — Encounter (HOSPITAL_COMMUNITY): Payer: Self-pay | Admitting: Emergency Medicine

## 2016-12-18 ENCOUNTER — Emergency Department (HOSPITAL_COMMUNITY): Payer: Medicare Other

## 2016-12-18 DIAGNOSIS — I129 Hypertensive chronic kidney disease with stage 1 through stage 4 chronic kidney disease, or unspecified chronic kidney disease: Secondary | ICD-10-CM | POA: Diagnosis not present

## 2016-12-18 DIAGNOSIS — N183 Chronic kidney disease, stage 3 (moderate): Secondary | ICD-10-CM | POA: Insufficient documentation

## 2016-12-18 DIAGNOSIS — E039 Hypothyroidism, unspecified: Secondary | ICD-10-CM | POA: Insufficient documentation

## 2016-12-18 DIAGNOSIS — Z79899 Other long term (current) drug therapy: Secondary | ICD-10-CM | POA: Insufficient documentation

## 2016-12-18 DIAGNOSIS — Z7982 Long term (current) use of aspirin: Secondary | ICD-10-CM | POA: Diagnosis not present

## 2016-12-18 DIAGNOSIS — R531 Weakness: Secondary | ICD-10-CM | POA: Diagnosis not present

## 2016-12-18 LAB — BASIC METABOLIC PANEL
Anion gap: 7 (ref 5–15)
BUN: 43 mg/dL — ABNORMAL HIGH (ref 6–20)
CO2: 22 mmol/L (ref 22–32)
Calcium: 9.1 mg/dL (ref 8.9–10.3)
Chloride: 109 mmol/L (ref 101–111)
Creatinine, Ser: 2.07 mg/dL — ABNORMAL HIGH (ref 0.44–1.00)
GFR calc Af Amer: 23 mL/min — ABNORMAL LOW (ref >60)
GFR calc non Af Amer: 20 mL/min — ABNORMAL LOW (ref >60)
Glucose, Bld: 182 mg/dL — ABNORMAL HIGH (ref 65–99)
Potassium: 4.3 mmol/L (ref 3.5–5.1)
Sodium: 138 mmol/L (ref 135–145)

## 2016-12-18 LAB — URINALYSIS, ROUTINE W REFLEX MICROSCOPIC
Bilirubin Urine: NEGATIVE
Glucose, UA: NEGATIVE mg/dL
Hgb urine dipstick: NEGATIVE
Ketones, ur: NEGATIVE mg/dL
Nitrite: NEGATIVE
Protein, ur: NEGATIVE mg/dL
Specific Gravity, Urine: 1.011 (ref 1.005–1.030)
pH: 6 (ref 5.0–8.0)

## 2016-12-18 LAB — CBC
HCT: 30.8 % — ABNORMAL LOW (ref 36.0–46.0)
Hemoglobin: 9.8 g/dL — ABNORMAL LOW (ref 12.0–15.0)
MCH: 30.7 pg (ref 26.0–34.0)
MCHC: 31.8 g/dL (ref 30.0–36.0)
MCV: 96.6 fL (ref 78.0–100.0)
Platelets: 257 10*3/uL (ref 150–400)
RBC: 3.19 MIL/uL — ABNORMAL LOW (ref 3.87–5.11)
RDW: 13.8 % (ref 11.5–15.5)
WBC: 7.1 10*3/uL (ref 4.0–10.5)

## 2016-12-18 LAB — CBG MONITORING, ED: Glucose-Capillary: 135 mg/dL — ABNORMAL HIGH (ref 65–99)

## 2016-12-18 NOTE — ED Notes (Signed)
Patient ambulated down hallway with walker. PA made aware.

## 2016-12-18 NOTE — ED Triage Notes (Signed)
Per EMS, patient is complaining of generalized weakness starting today. Denies nausea, vomiting, diarrhea, shortness of breath, chest pain. Staff states patient missed her nightly medications last night. Patient has hx of memory loss issues. Patient is alert and orientated today per EMS Patient is from [facility].

## 2016-12-18 NOTE — ED Notes (Signed)
Patient made aware of urine sample. Patient states she cannot void at this time. Patient encouraged to void when able. 

## 2016-12-18 NOTE — ED Notes (Signed)
ED Provider at bedside. 

## 2016-12-18 NOTE — ED Provider Notes (Signed)
[place] DEPT Provider Note   CSN: [id number] Arrival date & time: 12/18/16  1217     History   Chief Complaint Chief Complaint  Patient presents with  . Weakness    HPI Lynn Hayes is a 83 y.o. female.  HPI   Patient with PMH of anxiety and depression, aortic valve disease, BBB, bipolar disorder, CKD, GERD, herpes zoster, hiatal hernia, hypertension, hyperlipidemia, hypothyroid, osteoarthritis, comes to the ER brought in for generalized weakness. She lives alone and ambulates with a walker, home health visits 3 times a week to check on her and her daughter helps divide her pills. The nurse says that she seemed weak when she arrived today, pt admits that she was feeling weak but feels normal now. She denies having a fall, syncope, pain anywhere- headache, fever, N/V/D, confusion, change in vision. She is not sure when or if she took her medications but the home health nurse is concerned that she may have taken double dosages last night.  Past Medical History:  Diagnosis Date  . Anxiety and depression   . Aortic valve disease   . BBB (bundle branch block)    left, chronic  . Bipolar 1 disorder ([place])   . CKD (chronic kidney disease), stage III   . GERD (gastroesophageal reflux disease)   . Herpes zoster   . Hiatal hernia   . Hyperlipidemia   . Hypertension   . Hypertensive CKD (chronic kidney disease)   . Hypothyroid   . Osteoarthritis   . Osteopenia   . Renal artery stenosis [facility])    left    Patient Active Problem List   Diagnosis Date Noted  . Aortic valve disorder 11/07/2016  . Bipolar disorder ([place]) 11/07/2016  . Chronic kidney disease 11/07/2016  . Hypothyroidism 11/07/2016  . Chest pain 04/05/2014  . Left bundle branch block 05/28/2013  . Essential hypertension 05/28/2013  . Hyperlipidemia 05/28/2013  . Renal artery stenosis ([place]) 05/28/2013  . Carotid bruit 05/28/2013    Past Surgical History:  Procedure Laterality Date  . CARDIAC  CATHETERIZATION  03/21/04   essentially normal coronary arteries with moderate decrease in left ventricular function, 80% ostial left renal artery stenosis  . COLONOSCOPY  11/11/2006   Dr. Scarlette Shorts , diverticulosis, colon polyps  . [place]  . PANENDOSCOPY  11/11/2006   Dr. Scarlette Shorts, esophageal stricture s/p balloon dilation, GERD, Zenker's Diverticulum    OB History    No data available     Home Medications    Prior to Admission medications   Medication Sig Start Date End Date Taking? Authorizing Provider  aspirin EC 81 MG tablet Take 81 mg by mouth daily.   Yes Historical Provider, MD  buPROPion (WELLBUTRIN XL) 150 MG 24 hr tablet Take 450 mg by mouth daily.   Yes Historical Provider, MD  cholecalciferol (VITAMIN D) 1000 units tablet Take 1,000 Units by mouth daily.   Yes Historical Provider, MD  DULoxetine (CYMBALTA) 60 MG capsule Take 60 mg by mouth at bedtime.    Yes Historical Provider, MD  levothyroxine (SYNTHROID, LEVOTHROID) 50 MCG tablet Take one tablet by mouth 30 minutes before breakfast in the morning for thyroid. 11/21/16  Yes Gildardo Cranker, DO  lithium carbonate 150 MG capsule Take 150 mg by mouth daily.   Yes Historical Provider, MD  metoprolol succinate (TOPROL-XL) 25 MG 24 hr tablet TAKE 1 TABLET (25 MG TOTAL) BY MOUTH DAILY. 09/04/16  Yes Lorretta Harp, MD  ramipril ([place]) 10  MG capsule TAKE 1 CAPSULE (10 MG TOTAL) BY MOUTH DAILY. 10/08/16  Yes Minus Breeding, MD  simvastatin (ZOCOR) 10 MG tablet TAKE 1 TABLET (10 MG TOTAL) BY MOUTH AT BEDTIME. 07/02/16  Yes Lorretta Harp, MD    Family History Family History  Problem Relation Age of Onset  . Cancer Father   . Stroke Father   . Stroke Sister 32  . Diabetes Brother   . Heart attack Brother   . Appendicitis Mother   . Diabetes Brother   . Heart disease Brother     Social History Social History  Substance Use Topics  . Smoking status: Never Smoker  . Smokeless tobacco: Never Used    . Alcohol use No     Allergies   Patient has no known allergies.   Review of Systems Review of Systems  Review of Systems All other systems negative except as documented in the HPI. All pertinent positives and negatives as reviewed in the HPI.  Physical Exam Updated Vital Signs BP (!) 168/45 (BP Location: Right Arm)   Pulse 96   Temp 98.3 F (36.8 C) (Oral)   Resp 16   Ht 5\' 2"  (1.575 m)   Wt 60.8 kg   SpO2 99%   BMI 24.51 kg/m   Physical Exam  Constitutional: She appears well-developed and well-nourished. No distress.  HENT:  Head: Normocephalic and atraumatic.  Eyes: Conjunctivae and EOM are normal. Pupils are equal, round, and reactive to light. No scleral icterus.  Neck: Normal range of motion. Neck supple.  Cardiovascular: Normal rate, regular rhythm, normal heart sounds and intact distal pulses.   Pulmonary/Chest: Effort normal. No respiratory distress. She has no wheezes. She has no rales. She exhibits no tenderness.  Abdominal: Soft. There is no tenderness.  Musculoskeletal: She exhibits no edema or tenderness.  Neurological: She is alert. She has normal strength. No cranial nerve deficit or sensory deficit. She displays a negative Romberg sign.  No facial paralysis or slurring of speech, patient is alert and oriented to self.    Skin: Skin is warm and dry. No petechiae, no purpura and no rash noted. She is not diaphoretic.  Psychiatric: Her behavior is normal. Her speech is not slurred.  Nursing note and vitals reviewed.    ED Treatments / Results  Labs (all labs ordered are listed, but only abnormal results are displayed) Labs Reviewed  BASIC METABOLIC PANEL - Abnormal; Notable for the following:       Result Value   Glucose, Bld 182 (*)    BUN 43 (*)    Creatinine, Ser 2.07 (*)    GFR calc non Af Amer 20 (*)    GFR calc Af Amer 23 (*)    All other components within normal limits  CBC - Abnormal; Notable for the following:    RBC 3.19 (*)     Hemoglobin 9.8 (*)    HCT 30.8 (*)    All other components within normal limits  URINALYSIS, ROUTINE W REFLEX MICROSCOPIC - Abnormal; Notable for the following:    APPearance HAZY (*)    Leukocytes, UA TRACE (*)    Bacteria, UA RARE (*)    Squamous Epithelial / LPF 0-5 (*)    All other components within normal limits  CBG MONITORING, ED - Abnormal; Notable for the following:    Glucose-Capillary 135 (*)    All other components within normal limits  URINE CULTURE    EKG  EKG Interpretation None  Radiology Dg Chest 2 View  Result Date: 12/18/2016 CLINICAL DATA:  Generalized weakness. EXAM: CHEST  2 VIEW COMPARISON:  Radiographs of July 10, 2016. FINDINGS: The heart size and mediastinal contours are within normal limits. Both lungs are clear. No pneumothorax or pleural effusion is noted. Atherosclerosis of thoracic aorta is noted. The visualized skeletal structures are unremarkable.  IMPRESSION: No active cardiopulmonary disease.  Aortic atherosclerosis. Electronically Signed   By: Marijo Conception, M.D.   On: 12/18/2016 13:44   Procedures Procedures (including critical care time)  Medications Ordered in ED Medications - No data to display  Initial Impression / Assessment and Plan / ED Course  I have reviewed the triage vital signs and the nursing notes.  Pertinent labs & imaging results that were available during my care of the patient were reviewed by me and considered in my medical decision making (see chart for details).  Discussed with Dr. Wilson Singer and seen already. Patients labs are at baseline, normal chest xray, EKG and no UTI. Patient ambulated with assisted device she looked strong walking. Says she did feel slightly weak. The patients daughter is on here way and will be here at 4 pm to take her daughter home.  Ivette Loyal (daughter)  [phone] patients daughter said that she is depressed since her husband died last year. Ordered diet.  Final Clinical  Impressions(s) / ED Diagnoses   Final diagnoses:  Weakness    New Prescriptions New Prescriptions   No medications on file     Earney Navy 12/18/16 1453    Virgel Manifold, MD 12/24/16 1654

## 2016-12-18 NOTE — ED Notes (Signed)
Discharge instructions and follow up care reviewed with patient. Patient verbalized understanding. 

## 2016-12-19 LAB — URINE CULTURE: Culture: NO GROWTH

## 2016-12-25 ENCOUNTER — Non-Acute Institutional Stay: Payer: Medicare Other | Admitting: Internal Medicine

## 2016-12-25 ENCOUNTER — Encounter: Payer: Self-pay | Admitting: Internal Medicine

## 2016-12-25 VITALS — BP 116/52 | HR 71 | Temp 98.2°F | Ht 62.0 in | Wt 129.0 lb

## 2016-12-25 DIAGNOSIS — F319 Bipolar disorder, unspecified: Secondary | ICD-10-CM

## 2016-12-25 DIAGNOSIS — R269 Unspecified abnormalities of gait and mobility: Secondary | ICD-10-CM | POA: Diagnosis not present

## 2016-12-25 DIAGNOSIS — I10 Essential (primary) hypertension: Secondary | ICD-10-CM

## 2016-12-25 DIAGNOSIS — E034 Atrophy of thyroid (acquired): Secondary | ICD-10-CM | POA: Diagnosis not present

## 2016-12-25 DIAGNOSIS — R531 Weakness: Secondary | ICD-10-CM | POA: Diagnosis not present

## 2016-12-25 DIAGNOSIS — D649 Anemia, unspecified: Secondary | ICD-10-CM | POA: Diagnosis not present

## 2016-12-25 DIAGNOSIS — N183 Chronic kidney disease, stage 3 unspecified: Secondary | ICD-10-CM

## 2016-12-25 DIAGNOSIS — D638 Anemia in other chronic diseases classified elsewhere: Secondary | ICD-10-CM | POA: Insufficient documentation

## 2016-12-25 DIAGNOSIS — I359 Nonrheumatic aortic valve disorder, unspecified: Secondary | ICD-10-CM

## 2016-12-25 NOTE — Progress Notes (Signed)
Facility  [facility]    Place of Service: (P) Clinic (12)     No Known Allergies  Chief Complaint  Patient presents with  . Medical Management of Chronic Issues    12/18/16 went to ED for weakness.    HPI:  This is the first time I have seen this  Patient. She has previously had Dr. Reynaldo Minium as PCP.   She was in the ER 12/18/16 for weakness. They found anemia with Hgb 9.8. She had normal hgb last fall. No history of anemia, ulcer, blood in urine or stool. Normocytic anemia. Colonoscopy 2007 by Dr. Scarlette Shorts showed cecal polyp and diverticulosis. Prior EGD showed esophageal stricture, GERD, and Zenker's diverticulum.  She also had increased glucose which she says is new for her.   Medications: Patient's Medications  New Prescriptions   No medications on file  Previous Medications   ASPIRIN EC 81 MG TABLET    Take 81 mg by mouth daily.   BUPROPION (WELLBUTRIN XL) 150 MG 24 HR TABLET    Take 450 mg by mouth daily.   CHOLECALCIFEROL (VITAMIN D) 1000 UNITS TABLET    Take 1,000 Units by mouth daily.   DULOXETINE (CYMBALTA) 60 MG CAPSULE    Take 60 mg by mouth at bedtime.    L-METHYLFOLATE (DEPLIN) 7.5 MG TABS    Take by mouth. Take one tablet daily   LEVOTHYROXINE (SYNTHROID, LEVOTHROID) 50 MCG TABLET    Take one tablet by mouth 30 minutes before breakfast in the morning for thyroid.   LITHIUM CARBONATE 150 MG CAPSULE    Take 150 mg by mouth daily.   METOPROLOL SUCCINATE (TOPROL-XL) 25 MG 24 HR TABLET    TAKE 1 TABLET (25 MG TOTAL) BY MOUTH DAILY.   PALIPERIDONE (INVEGA) 3 MG 24 HR TABLET    Take 3 mg by mouth daily.   RAMIPRIL (ALTACE) 10 MG CAPSULE    TAKE 1 CAPSULE (10 MG TOTAL) BY MOUTH DAILY.   SIMVASTATIN (ZOCOR) 10 MG TABLET    TAKE 1 TABLET (10 MG TOTAL) BY MOUTH AT BEDTIME.  Modified Medications   No medications on file  Discontinued Medications   No medications on file     Review of Systems  Constitutional: Positive for fatigue. Negative for activity change, appetite  change, chills, diaphoresis, fever and unexpected weight change.  HENT: Negative for congestion, ear discharge, ear pain, hearing loss, postnasal drip, rhinorrhea, sore throat, tinnitus, trouble swallowing and voice change.   Eyes: Negative for pain, redness, itching and visual disturbance.  Respiratory: Negative for cough, choking, shortness of breath and wheezing.   Cardiovascular: Negative for chest pain, palpitations and leg swelling.       Hx aortic valve disease  Gastrointestinal: Negative for abdominal distention, abdominal pain, constipation, diarrhea and nausea.  Endocrine: Negative for cold intolerance, heat intolerance, polydipsia, polyphagia and polyuria.       Hyperglycemia to 135  Genitourinary: Negative for difficulty urinating, dysuria, flank pain, frequency, hematuria, pelvic pain, urgency and vaginal discharge.  Musculoskeletal: Positive for gait problem. Negative for arthralgias, back pain, myalgias, neck pain and neck stiffness.  Skin: Positive for pallor. Negative for color change and rash.  Allergic/Immunologic: Negative.   Neurological: Negative for dizziness, tremors, seizures, syncope, weakness, numbness and headaches.  Hematological: Negative for adenopathy. Does not bruise/bleed easily.       Normocytic anemia  Psychiatric/Behavioral: Positive for dysphoric mood. Negative for agitation, behavioral problems, confusion, hallucinations, sleep disturbance and suicidal ideas. The patient is  nervous/anxious. The patient is not hyperactive.     Vitals:   12/25/16 0858  BP: (!) 116/52  Pulse: 71  Temp: 98.2 F (36.8 C)  TempSrc: Oral  SpO2: 97%  Weight: 129 lb (58.5 kg)  Height: '5\' 2"'  (1.575 m)   Wt Readings from Last 3 Encounters:  12/25/16 129 lb (58.5 kg)  12/18/16 134 lb (60.8 kg)  11/07/16 134 lb 3.2 oz (60.9 kg)    Body mass index is 23.59 kg/m.  Physical Exam  Constitutional: She is oriented to person, place, and time. She appears well-developed and  well-nourished. No distress.  Frail. Elderly.  HENT:  Right Ear: External ear normal.  Left Ear: External ear normal.  Nose: Nose normal.  Mouth/Throat: Oropharynx is clear and moist. No oropharyngeal exudate.  Eyes: Conjunctivae and EOM are normal. Pupils are equal, round, and reactive to light. No scleral icterus.  Neck: No JVD present. No tracheal deviation present. No thyromegaly present.  Cardiovascular: Normal rate, regular rhythm, normal heart sounds and intact distal pulses.  Exam reveals no gallop and no friction rub.   No murmur heard. Pulmonary/Chest: Effort normal. No respiratory distress. She has no wheezes. She has no rales. She exhibits no tenderness.  Abdominal: She exhibits no distension and no mass. There is no tenderness.  Genitourinary: Rectal exam shows guaiac negative stool.  Musculoskeletal: Normal range of motion. She exhibits no edema or tenderness.  Using 4 wheel walker.  Lymphadenopathy:    She has no cervical adenopathy.  Neurological: She is alert and oriented to person, place, and time. No cranial nerve deficit. Coordination normal.  Skin: No rash noted. She is not diaphoretic. No erythema. There is pallor.  Psychiatric: She has a normal mood and affect. Her behavior is normal. Judgment and thought content normal.     Labs reviewed: Lab Summary Latest Ref Rng & Units 12/18/2016 11/07/2016 07/10/2016  Hemoglobin 12.0 - 15.0 g/dL 9.8(L) 9.8(L) 11.7(A)  Hematocrit 36.0 - 46.0 % 30.8(L) 31.0(L) 33.9(A)  White count 4.0 - 10.5 K/uL 7.1 6.4 10.4(A)  Platelet count 150 - 400 K/uL 257 302 (None)  Sodium 135 - 145 mmol/L 138 140 138  Potassium 3.5 - 5.1 mmol/L 4.3 5.1 4.1  Calcium 8.9 - 10.3 mg/dL 9.1 9.1 9.2  Phosphorus - (None) (None) (None)  Creatinine 0.44 - 1.00 mg/dL 2.07(H) 1.80(H) 1.67(H)  AST 10 - 35 U/L (None) 17 (None)  Alk Phos 33 - 130 U/L (None) 49 (None)  Bilirubin 0.2 - 1.2 mg/dL (None) 0.3 (None)  Glucose 65 - 99 mg/dL 182(H) 111(H) 100(H)    Cholesterol <200 mg/dL (None) 144 (None)  HDL cholesterol >50 mg/dL (None) 71 (None)  Triglycerides <150 mg/dL (None) 69 (None)  LDL Direct - (None) (None) (None)  LDL Calc <100 mg/dL (None) 59 (None)  Total protein 6.1 - 8.1 g/dL (None) 5.9(L) (None)  Albumin 3.6 - 5.1 g/dL (None) 3.8 (None)  Some recent data might be hidden   Lab Results  Component Value Date   TSH 2.43 11/07/2016   Lab Results  Component Value Date   BUN 43 (H) 12/18/2016   BUN 36 (H) 11/07/2016   BUN 30 (H) 07/10/2016   Lab Results  Component Value Date   CREATININE 2.07 (H) 12/18/2016   CREATININE 1.80 (H) 11/07/2016   CREATININE 1.67 (H) 07/10/2016   No results found for: HGBA1C     Assessment/Plan  1. Anemia, unspecified type - CBC with Differential/Platelet; Future - Reticulocytes; Future - Iron and TIBC;  Future - Ferritin; Future - Vitamin B12; Future - Folate; Future  2. Weak - Comprehensive metabolic panel; Future - Vitamin B12; Future  3. Abnormality of gait Continue to use walker  4. Hypothyroidism due to acquired atrophy of thyroid - TSH; Future  5. Essential hypertension The current medical regimen is effective;  continue present plan and medications.  6. Stage 3 chronic kidney disease - Comprehensive metabolic panel; Future  7. Bipolar affective disorder, remission status unspecified ([place]) The current medical regimen is effective;  continue present plan and medications.  8. Aortic valve disorder incidental finding

## 2016-12-25 NOTE — Addendum Note (Signed)
Addended by: Estill Dooms on: 12/25/2016 03:57 PM   Modules accepted: Level of Service

## 2016-12-26 ENCOUNTER — Other Ambulatory Visit: Payer: Self-pay

## 2016-12-26 DIAGNOSIS — N183 Chronic kidney disease, stage 3 unspecified: Secondary | ICD-10-CM

## 2016-12-26 DIAGNOSIS — D649 Anemia, unspecified: Secondary | ICD-10-CM

## 2016-12-26 DIAGNOSIS — E034 Atrophy of thyroid (acquired): Secondary | ICD-10-CM

## 2016-12-26 DIAGNOSIS — R531 Weakness: Secondary | ICD-10-CM

## 2016-12-27 LAB — CBC WITH DIFFERENTIAL/PLATELET
BASOS PCT: 0 %
Basophils Absolute: 0 cells/uL (ref 0–200)
EOS ABS: 195 {cells}/uL (ref 15–500)
Eosinophils Relative: 3 %
HEMATOCRIT: 31.1 % — AB (ref 35.0–45.0)
Hemoglobin: 10 g/dL — ABNORMAL LOW (ref 11.7–15.5)
Lymphocytes Relative: 18 %
Lymphs Abs: 1170 cells/uL (ref 850–3900)
MCH: 31.3 pg (ref 27.0–33.0)
MCHC: 32.2 g/dL (ref 32.0–36.0)
MCV: 97.2 fL (ref 80.0–100.0)
MONO ABS: 455 {cells}/uL (ref 200–950)
MPV: 8.8 fL (ref 7.5–12.5)
Monocytes Relative: 7 %
NEUTROS ABS: 4680 {cells}/uL (ref 1500–7800)
Neutrophils Relative %: 72 %
PLATELETS: 296 10*3/uL (ref 140–400)
RBC: 3.2 MIL/uL — ABNORMAL LOW (ref 3.80–5.10)
RDW: 13.3 % (ref 11.0–15.0)
WBC: 6.5 10*3/uL (ref 3.8–10.8)

## 2016-12-27 LAB — COMPREHENSIVE METABOLIC PANEL
ALT: 18 U/L (ref 6–29)
AST: 17 U/L (ref 10–35)
Albumin: 3.6 g/dL (ref 3.6–5.1)
Alkaline Phosphatase: 52 U/L (ref 33–130)
BUN: 30 mg/dL — AB (ref 7–25)
CHLORIDE: 113 mmol/L — AB (ref 98–110)
CO2: 22 mmol/L (ref 20–31)
CREATININE: 1.72 mg/dL — AB (ref 0.60–0.88)
Calcium: 8.9 mg/dL (ref 8.6–10.4)
Glucose, Bld: 114 mg/dL — ABNORMAL HIGH (ref 65–99)
Potassium: 4.9 mmol/L (ref 3.5–5.3)
Sodium: 142 mmol/L (ref 135–146)
TOTAL PROTEIN: 5.9 g/dL — AB (ref 6.1–8.1)
Total Bilirubin: 0.3 mg/dL (ref 0.2–1.2)

## 2016-12-27 LAB — VITAMIN B12: Vitamin B-12: 283 pg/mL (ref 200–1100)

## 2016-12-27 LAB — RETICULOCYTES
ABS RETIC: 76800 {cells}/uL (ref 20000–80000)
RBC.: 3.2 MIL/uL — ABNORMAL LOW (ref 3.80–5.10)
Retic Ct Pct: 2.4 %

## 2016-12-27 LAB — IRON AND TIBC
%SAT: 21 % (ref 11–50)
Iron: 64 ug/dL (ref 45–160)
TIBC: 304 ug/dL (ref 250–450)
UIBC: 240 ug/dL (ref 125–400)

## 2016-12-27 LAB — TSH: TSH: 1.77 m[IU]/L

## 2016-12-27 LAB — FOLATE: Folate: >24 ng/mL (ref >5.4)

## 2016-12-27 LAB — FERRITIN: FERRITIN: 38 ng/mL (ref 20–288)

## 2017-01-21 ENCOUNTER — Telehealth: Payer: Self-pay | Admitting: *Deleted

## 2017-01-21 NOTE — Telephone Encounter (Signed)
Patient daughter, Shauna Hugh called and stated that patient had test done at her appointment in January at [facility] and she is calling wanting to know the results. Stated that the patient has a hard time trying to call. Please Advise.

## 2017-01-22 NOTE — Telephone Encounter (Signed)
She has a mild anemia. Several tests were done to determine why she is anemic, but they were normal (iron, B12, folate).

## 2017-01-22 NOTE — Telephone Encounter (Signed)
LMOM for Diane to Return call.

## 2017-01-23 NOTE — Telephone Encounter (Signed)
Daughter will be available on Friday.

## 2017-01-23 NOTE — Telephone Encounter (Signed)
Patient daughter Shauna Hugh notified and agreed.

## 2017-01-29 ENCOUNTER — Non-Acute Institutional Stay: Payer: Medicare Other | Admitting: Internal Medicine

## 2017-01-29 ENCOUNTER — Encounter: Payer: Self-pay | Admitting: Internal Medicine

## 2017-01-29 VITALS — BP 142/52 | HR 67 | Temp 98.0°F | Ht 62.0 in | Wt 129.0 lb

## 2017-01-29 DIAGNOSIS — R531 Weakness: Secondary | ICD-10-CM

## 2017-01-29 DIAGNOSIS — I10 Essential (primary) hypertension: Secondary | ICD-10-CM

## 2017-01-29 DIAGNOSIS — E782 Mixed hyperlipidemia: Secondary | ICD-10-CM

## 2017-01-29 DIAGNOSIS — N183 Chronic kidney disease, stage 3 unspecified: Secondary | ICD-10-CM

## 2017-01-29 DIAGNOSIS — D649 Anemia, unspecified: Secondary | ICD-10-CM | POA: Diagnosis not present

## 2017-01-29 DIAGNOSIS — E034 Atrophy of thyroid (acquired): Secondary | ICD-10-CM

## 2017-01-29 NOTE — Progress Notes (Addendum)
[place] of Service: Clinic (12)     No Known Allergies  Chief Complaint  Patient presents with  . Medical Management of Chronic Issues    3 month medication management anemia, weak, thyroid, CKD, blood pressure, review labs.    HPI:  Anemia, unspecified type 0 mild normocytic and normochromic  Stage 3 chronic kidney disease - stable  Essential hypertension - controlled  Hypothyroidism due to acquired atrophy of thyroid - compensated  Weak - generalized    Medications: Patient's Medications  New Prescriptions   No medications on file  Previous Medications   ASPIRIN EC 81 MG TABLET    Take 81 mg by mouth daily.   BUPROPION (WELLBUTRIN XL) 150 MG 24 HR TABLET    Take 450 mg by mouth daily.   CHOLECALCIFEROL (VITAMIN D) 1000 UNITS TABLET    Take 1,000 Units by mouth daily.   DULOXETINE (CYMBALTA) 60 MG CAPSULE    Take 60 mg by mouth at bedtime.    L-METHYLFOLATE (DEPLIN) 7.5 MG TABS    Take by mouth. Take one tablet daily   LEVOTHYROXINE (SYNTHROID, LEVOTHROID) 50 MCG TABLET    Take one tablet by mouth 30 minutes before breakfast in the morning for thyroid.   LITHIUM CARBONATE 150 MG CAPSULE    Take 150 mg by mouth daily.   METOPROLOL SUCCINATE (TOPROL-XL) 25 MG 24 HR TABLET    TAKE 1 TABLET (25 MG TOTAL) BY MOUTH DAILY.   PALIPERIDONE (INVEGA) 3 MG 24 HR TABLET    Take 3 mg by mouth daily.   RAMIPRIL (ALTACE) 10 MG CAPSULE    TAKE 1 CAPSULE (10 MG TOTAL) BY MOUTH DAILY.   SIMVASTATIN (ZOCOR) 10 MG TABLET    TAKE 1 TABLET (10 MG TOTAL) BY MOUTH AT BEDTIME.  Modified Medications   No medications on file  Discontinued Medications   No medications on file     Review of Systems  Constitutional: Positive for fatigue. Negative for activity change, appetite change, chills, diaphoresis, fever and unexpected weight change.  HENT: Negative for congestion, ear discharge, ear pain, hearing loss, postnasal drip, rhinorrhea, sore throat, tinnitus, trouble  swallowing and voice change.   Eyes: Negative for pain, redness, itching and visual disturbance.  Respiratory: Negative for cough, choking, shortness of breath and wheezing.   Cardiovascular: Negative for chest pain, palpitations and leg swelling.       Hx aortic valve disease  Gastrointestinal: Negative for abdominal distention, abdominal pain, constipation, diarrhea and nausea.  Endocrine: Negative for cold intolerance, heat intolerance, polydipsia, polyphagia and polyuria.       Hyperglycemia to 135  Genitourinary: Negative for difficulty urinating, dysuria, flank pain, frequency, hematuria, pelvic pain, urgency and vaginal discharge.  Musculoskeletal: Positive for gait problem. Negative for arthralgias, back pain, myalgias, neck pain and neck stiffness.  Skin: Positive for pallor. Negative for color change and rash.  Allergic/Immunologic: Negative.   Neurological: Negative for dizziness, tremors, seizures, syncope, weakness, numbness and headaches.  Hematological: Negative for adenopathy. Does not bruise/bleed easily.       Normocytic anemia  Psychiatric/Behavioral: Positive for dysphoric mood. Negative for agitation, behavioral problems, confusion, hallucinations, sleep disturbance and suicidal ideas. The patient is nervous/anxious. The patient is not hyperactive.     Vitals:   01/29/17 0933  BP: (!) 142/52  Pulse: 67  Temp: 98 F (36.7 C)  TempSrc: Oral  SpO2: 92%  Weight: 129 lb (58.5 kg)  Height: '5\' 2"'  (1.575  m)   Wt Readings from Last 3 Encounters:  01/29/17 129 lb (58.5 kg)  12/25/16 129 lb (58.5 kg)  12/18/16 134 lb (60.8 kg)    Body mass index is 23.59 kg/m.  Physical Exam  Constitutional: She is oriented to person, place, and time. She appears well-developed and well-nourished. No distress.  Frail. Elderly.  HENT:  Right Ear: External ear normal.  Left Ear: External ear normal.  Nose: Nose normal.  Mouth/Throat: Oropharynx is clear and moist. No oropharyngeal  exudate.  Eyes: Conjunctivae and EOM are normal. Pupils are equal, round, and reactive to light. No scleral icterus.  Neck: No JVD present. No tracheal deviation present. No thyromegaly present.  Cardiovascular: Normal rate, regular rhythm, normal heart sounds and intact distal pulses.  Exam reveals no gallop and no friction rub.   No murmur heard. Pulmonary/Chest: Effort normal. No respiratory distress. She has no wheezes. She has no rales. She exhibits no tenderness.  Abdominal: She exhibits no distension and no mass. There is no tenderness.  Genitourinary: Rectal exam shows guaiac negative stool.  Musculoskeletal: Normal range of motion. She exhibits no edema or tenderness.  Using 4 wheel walker.  Lymphadenopathy:    She has no cervical adenopathy.  Neurological: She is alert and oriented to person, place, and time. No cranial nerve deficit. Coordination normal.  Skin: No rash noted. She is not diaphoretic. No erythema. There is pallor.  Psychiatric: She has a normal mood and affect. Her behavior is normal. Judgment and thought content normal.     Labs reviewed: Lab Summary Latest Ref Rng & Units 12/26/2016 12/18/2016 11/07/2016  Hemoglobin 11.7 - 15.5 g/dL 10.0(L) 9.8(L) 9.8(L)  Hematocrit 35.0 - 45.0 % 31.1(L) 30.8(L) 31.0(L)  White count 3.8 - 10.8 K/uL 6.5 7.1 6.4  Platelet count 140 - 400 K/uL 296 257 302  Sodium 135 - 146 mmol/L 142 138 140  Potassium 3.5 - 5.3 mmol/L 4.9 4.3 5.1  Calcium 8.6 - 10.4 mg/dL 8.9 9.1 9.1  Phosphorus - (None) (None) (None)  Creatinine 0.60 - 0.88 mg/dL 1.72(H) 2.07(H) 1.80(H)  AST 10 - 35 U/L 17 (None) 17  Alk Phos 33 - 130 U/L 52 (None) 49  Bilirubin 0.2 - 1.2 mg/dL 0.3 (None) 0.3  Glucose 65 - 99 mg/dL 114(H) 182(H) 111(H)  Cholesterol <200 mg/dL (None) (None) 144  HDL cholesterol >50 mg/dL (None) (None) 71  Triglycerides <150 mg/dL (None) (None) 69  LDL Direct - (None) (None) (None)  LDL Calc <100 mg/dL (None) (None) 59  Total protein 6.1 -  8.1 g/dL 5.9(L) (None) 5.9(L)  Albumin 3.6 - 5.1 g/dL 3.6 (None) 3.8  Some recent data might be hidden   Lab Results  Component Value Date   TSH 1.77 12/26/2016   Lab Results  Component Value Date   BUN 30 (H) 12/26/2016   BUN 43 (H) 12/18/2016   BUN 36 (H) 11/07/2016   Lab Results  Component Value Date   CREATININE 1.72 (H) 12/26/2016   CREATININE 2.07 (H) 12/18/2016   CREATININE 1.80 (H) 11/07/2016   No results found for: HGBA1C     Assessment/Plan  1. Anemia, unspecified type Discussed referral to hematologist. We will just follow the lab for now, since the anemia is stable - CBC with Differential/Platelet; Future  2. Stage 3 chronic kidney disease - Basic metabolic panel; Future  3. Essential hypertension The current medical regimen is effective;  continue present plan and medications. - Basic metabolic panel; Future  4. Hypothyroidism due to  acquired atrophy of thyroid compensated  5. Weak follow  6. Mixed hyperlipidemia - Lipid panel; Future

## 2017-02-21 ENCOUNTER — Encounter: Payer: Self-pay | Admitting: Internal Medicine

## 2017-02-21 DIAGNOSIS — R413 Other amnesia: Secondary | ICD-10-CM | POA: Insufficient documentation

## 2017-02-24 ENCOUNTER — Other Ambulatory Visit: Payer: Self-pay | Admitting: Cardiology

## 2017-02-24 ENCOUNTER — Other Ambulatory Visit: Payer: Self-pay | Admitting: Cardiovascular Disease

## 2017-02-25 NOTE — Telephone Encounter (Signed)
Rx request sent to pharmacy.  

## 2017-04-07 ENCOUNTER — Other Ambulatory Visit: Payer: Self-pay | Admitting: Internal Medicine

## 2017-04-18 ENCOUNTER — Telehealth: Payer: Self-pay | Admitting: *Deleted

## 2017-04-18 NOTE — Telephone Encounter (Signed)
Left message for patient to call and reschedule time of appointment on 05/17/17--Dr. Gwenlyn Found has a meeting 11:30--1:30

## 2017-04-29 LAB — CBC WITH DIFFERENTIAL/PLATELET
BASOS ABS: 65 {cells}/uL (ref 0–200)
BASOS PCT: 1 %
EOS PCT: 4 %
Eosinophils Absolute: 260 cells/uL (ref 15–500)
HCT: 31.4 % — ABNORMAL LOW (ref 35.0–45.0)
HEMOGLOBIN: 9.9 g/dL — AB (ref 11.7–15.5)
LYMPHS ABS: 1105 {cells}/uL (ref 850–3900)
Lymphocytes Relative: 17 %
MCH: 31.3 pg (ref 27.0–33.0)
MCHC: 31.5 g/dL — ABNORMAL LOW (ref 32.0–36.0)
MCV: 99.4 fL (ref 80.0–100.0)
MPV: 8.9 fL (ref 7.5–12.5)
Monocytes Absolute: 390 cells/uL (ref 200–950)
Monocytes Relative: 6 %
NEUTROS ABS: 4680 {cells}/uL (ref 1500–7800)
Neutrophils Relative %: 72 %
Platelets: 283 10*3/uL (ref 140–400)
RBC: 3.16 MIL/uL — ABNORMAL LOW (ref 3.80–5.10)
RDW: 13 % (ref 11.0–15.0)
WBC: 6.5 10*3/uL (ref 3.8–10.8)

## 2017-04-29 LAB — BASIC METABOLIC PANEL
BUN: 32 mg/dL — ABNORMAL HIGH (ref 7–25)
CALCIUM: 9.2 mg/dL (ref 8.6–10.4)
CHLORIDE: 112 mmol/L — AB (ref 98–110)
CO2: 21 mmol/L (ref 20–31)
Creat: 1.87 mg/dL — ABNORMAL HIGH (ref 0.60–0.88)
GLUCOSE: 99 mg/dL (ref 65–99)
POTASSIUM: 4.9 mmol/L (ref 3.5–5.3)
SODIUM: 141 mmol/L (ref 135–146)

## 2017-04-29 LAB — LIPID PANEL
CHOL/HDL RATIO: 1.9 ratio (ref <5.0)
Cholesterol: 147 mg/dL (ref <200)
HDL: 77 mg/dL (ref >50)
LDL CALC: 55 mg/dL (ref <100)
Triglycerides: 74 mg/dL (ref <150)
VLDL: 15 mg/dL (ref <30)

## 2017-05-07 ENCOUNTER — Non-Acute Institutional Stay: Payer: Medicare Other | Admitting: Internal Medicine

## 2017-05-07 ENCOUNTER — Encounter: Payer: Self-pay | Admitting: Internal Medicine

## 2017-05-07 VITALS — BP 158/56 | HR 61 | Temp 98.0°F | Ht 62.0 in | Wt 135.0 lb

## 2017-05-07 DIAGNOSIS — E782 Mixed hyperlipidemia: Secondary | ICD-10-CM

## 2017-05-07 DIAGNOSIS — I10 Essential (primary) hypertension: Secondary | ICD-10-CM

## 2017-05-07 DIAGNOSIS — E034 Atrophy of thyroid (acquired): Secondary | ICD-10-CM

## 2017-05-07 DIAGNOSIS — N183 Chronic kidney disease, stage 3 unspecified: Secondary | ICD-10-CM

## 2017-05-07 DIAGNOSIS — R197 Diarrhea, unspecified: Secondary | ICD-10-CM

## 2017-05-07 DIAGNOSIS — F319 Bipolar disorder, unspecified: Secondary | ICD-10-CM

## 2017-05-07 DIAGNOSIS — D649 Anemia, unspecified: Secondary | ICD-10-CM | POA: Diagnosis not present

## 2017-05-07 MED ORDER — LOPERAMIDE HCL 2 MG PO TABS: ORAL_TABLET | ORAL | 0 refills | Status: DC

## 2017-05-07 NOTE — Progress Notes (Signed)
[place] of Service: Clinic (12)     No Known Allergies  Chief Complaint  Patient presents with  . Medical Management of Chronic Issues    3 month medication management blood pressure, anemia, thyroid, CKD, review labs  . Diarrhea    once a week for a while. Went to PCP gave her some power stuff, didn't help.     HPI:  Anemia, unspecified type - stable and likely related to CKD  Essential hypertension - controlled  Stage 3 chronic kidney disease - stable  Mixed hyperlipidemia - controlled  Hypothyroidism due to acquired atrophy of thyroid - compensated  Diarrhea, unspecified type - has a problem with uncontrollable liquid stools about 1-2 days per week. Not using a laxative. Has been present for months. No blood in stool or pain.  Bipolar affective disorder, remission status unspecified ([place]), Chronic - controlled onn present medication.    Medications: Patient's Medications  New Prescriptions   No medications on file  Previous Medications   ASPIRIN EC 81 MG TABLET    Take 81 mg by mouth daily.   BUPROPION (WELLBUTRIN XL) 150 MG 24 HR TABLET    Take 450 mg by mouth daily.   CHOLECALCIFEROL (VITAMIN D) 1000 UNITS TABLET    Take 1,000 Units by mouth daily.   DULOXETINE (CYMBALTA) 60 MG CAPSULE    Take 60 mg by mouth at bedtime.    L-METHYLFOLATE (DEPLIN) 7.5 MG TABS    Take by mouth. Take one tablet daily   LEVOTHYROXINE (SYNTHROID, LEVOTHROID) 50 MCG TABLET    TAKE ONE TABLET BY MOUTH EVERY MORNING ON AN EMPTY STOMACH FOR THYROID   LITHIUM CARBONATE 150 MG CAPSULE    Take 150 mg by mouth daily.   METOPROLOL SUCCINATE (TOPROL-XL) 25 MG 24 HR TABLET    TAKE 1 TABLET (25 MG TOTAL) BY MOUTH DAILY.   PALIPERIDONE (INVEGA) 3 MG 24 HR TABLET    Take 3 mg by mouth daily.   RAMIPRIL (ALTACE) 10 MG CAPSULE    TAKE 1 CAPSULE(S) BY MOUTH DAILY   SIMVASTATIN (ZOCOR) 10 MG TABLET    TAKE 1 TABLET (10 MG TOTAL) BY MOUTH AT BEDTIME.  Modified Medications   No  medications on file  Discontinued Medications   No medications on file     Review of Systems  Constitutional: Positive for fatigue. Negative for activity change, appetite change, chills, diaphoresis, fever and unexpected weight change.  HENT: Negative for congestion, ear discharge, ear pain, hearing loss, postnasal drip, rhinorrhea, sore throat, tinnitus, trouble swallowing and voice change.   Eyes: Negative for pain, redness, itching and visual disturbance.  Respiratory: Negative for cough, choking, shortness of breath and wheezing.   Cardiovascular: Negative for chest pain, palpitations and leg swelling.       Hx aortic valve disease  Gastrointestinal: Positive for diarrhea. Negative for abdominal distention, abdominal pain, constipation and nausea.  Endocrine: Negative for cold intolerance, heat intolerance, polydipsia, polyphagia and polyuria.       Hyperglycemia to 135  Genitourinary: Negative for difficulty urinating, dysuria, flank pain, frequency, hematuria, pelvic pain, urgency and vaginal discharge.  Musculoskeletal: Positive for gait problem. Negative for arthralgias, back pain, myalgias, neck pain and neck stiffness.  Skin: Positive for pallor. Negative for color change and rash.  Allergic/Immunologic: Negative.   Neurological: Negative for dizziness, tremors, seizures, syncope, weakness, numbness and headaches.  Hematological: Negative for adenopathy. Does not bruise/bleed easily.  Normocytic anemia  Psychiatric/Behavioral: Positive for dysphoric mood. Negative for agitation, behavioral problems, confusion, hallucinations, sleep disturbance and suicidal ideas. The patient is nervous/anxious. The patient is not hyperactive.     Vitals:   05/07/17 1026  BP: (!) 158/56  Pulse: 61  Temp: 98 F (36.7 C)  TempSrc: Oral  SpO2: 98%  Weight: 135 lb (61.2 kg)  Height: 5' 2" (1.575 m)   Wt Readings from Last 3 Encounters:  05/07/17 135 lb (61.2 kg)  01/29/17 129 lb (58.5  kg)  12/25/16 129 lb (58.5 kg)    Body mass index is 24.69 kg/m.  Physical Exam  Constitutional: She is oriented to person, place, and time. She appears well-developed and well-nourished. No distress.  Frail. Elderly.  HENT:  Right Ear: External ear normal.  Left Ear: External ear normal.  Nose: Nose normal.  Mouth/Throat: Oropharynx is clear and moist. No oropharyngeal exudate.  Eyes: Conjunctivae and EOM are normal. Pupils are equal, round, and reactive to light. No scleral icterus.  Neck: No JVD present. No tracheal deviation present. No thyromegaly present.  Cardiovascular: Normal rate, regular rhythm, normal heart sounds and intact distal pulses.  Exam reveals no gallop and no friction rub.   No murmur heard. Pulmonary/Chest: Effort normal. No respiratory distress. She has no wheezes. She has no rales. She exhibits no tenderness.  Abdominal: She exhibits no distension and no mass. There is no tenderness.  Genitourinary: Rectal exam shows guaiac negative stool.  Musculoskeletal: Normal range of motion. She exhibits no edema or tenderness.  Using 4 wheel walker.  Lymphadenopathy:    She has no cervical adenopathy.  Neurological: She is alert and oriented to person, place, and time. No cranial nerve deficit. Coordination normal.  Skin: No rash noted. She is not diaphoretic. No erythema. There is pallor.  Psychiatric: She has a normal mood and affect. Her behavior is normal. Judgment and thought content normal.     Labs reviewed: Lab Summary Latest Ref Rng & Units 04/26/2017 12/26/2016 12/18/2016  Hemoglobin 11.7 - 15.5 g/dL 9.9(L) 10.0(L) 9.8(L)  Hematocrit 35.0 - 45.0 % 31.4(L) 31.1(L) 30.8(L)  White count 3.8 - 10.8 K/uL 6.5 6.5 7.1  Platelet count 140 - 400 K/uL 283 296 257  Sodium 135 - 146 mmol/L 141 142 138  Potassium 3.5 - 5.3 mmol/L 4.9 4.9 4.3  Calcium 8.6 - 10.4 mg/dL 9.2 8.9 9.1  Phosphorus - (None) (None) (None)  Creatinine 0.60 - 0.88 mg/dL 1.87(H) 1.72(H) 2.07(H)   AST 10 - 35 U/L (None) 17 (None)  Alk Phos 33 - 130 U/L (None) 52 (None)  Bilirubin 0.2 - 1.2 mg/dL (None) 0.3 (None)  Glucose 65 - 99 mg/dL 99 114(H) 182(H)  Cholesterol <200 mg/dL 147 (None) (None)  HDL cholesterol >50 mg/dL 77 (None) (None)  Triglycerides <150 mg/dL 74 (None) (None)  LDL Direct - (None) (None) (None)  LDL Calc <100 mg/dL 55 (None) (None)  Total protein 6.1 - 8.1 g/dL (None) 5.9(L) (None)  Albumin 3.6 - 5.1 g/dL (None) 3.6 (None)  Some recent data might be hidden   Lab Results  Component Value Date   TSH 1.77 12/26/2016   Lab Results  Component Value Date   BUN 32 (H) 04/26/2017   BUN 30 (H) 12/26/2016   BUN 43 (H) 12/18/2016   Lab Results  Component Value Date   CREATININE 1.87 (H) 04/26/2017   CREATININE 1.72 (H) 12/26/2016   CREATININE 2.07 (H) 12/18/2016   No results found for: HGBA1C  Assessment/Plan  1. Anemia, unspecified type Stable. Likely related to CKD. - CBC with Differential/Platelet; Future  2. Essential hypertension The current medical regimen is effective;  continue present plan and medications. - Basic metabolic panel; Future  3. Stage 3 chronic kidney disease - Basic metabolic panel; Future  4. Mixed hyperlipidemia The current medical regimen is effective;  continue present plan and medications.  5. Hypothyroidism due to acquired atrophy of thyroid The current medical regimen is effective;  continue present plan and medications. - TSH; Future  6. Diarrhea, unspecified type - loperamide (IMODIUM A-D) 2 MG tablet; 1/2 to 1 tablet daily to control diarrhea  Dispense: 30 tablet; Refill: 0 Will need to see GI if regular dose of Imodium does not help.  7. Bipolar affective disorder, remission status unspecified ([place]) The current medical regimen is effective;  continue present plan and medications.

## 2017-05-14 ENCOUNTER — Telehealth: Payer: Self-pay | Admitting: *Deleted

## 2017-05-14 NOTE — Telephone Encounter (Signed)
Patient caregiver, Anne Ng called and stated that patient gave patient Imodium at last OV visit. Patient is now having Constipation and request a Laxative. Patient is a Public house manager. Last Bowel Movement was on the 12th. No pain just uncomfortable. Drinking prune juice with no relief. Please Advise.

## 2017-05-14 NOTE — Telephone Encounter (Signed)
Please have patient stop taking imodium for now. She can take a dose of miralax to help with her bowel movement. She should have a bowel movement following this. Provide script for miralax 17 g daily as needed for 2 weeks supply. If no improvement, to be seen in clinic for further assessment. Also to notify right away if develops abdominal pain, nausea or vomiting.

## 2017-05-15 NOTE — Telephone Encounter (Signed)
Patient and Anne Ng notified. They said she had a regular BM last PM and feels much better today. Advised to hold off on taking the imodium for now. They advised they didn't believe that the miralax was necessary at this time. Advised them of reasons to call back immediately and to be seen in clinic. Verbalized understanding. They will call with any further problems.

## 2017-05-15 NOTE — Telephone Encounter (Signed)
thanks

## 2017-05-17 ENCOUNTER — Ambulatory Visit (INDEPENDENT_AMBULATORY_CARE_PROVIDER_SITE_OTHER): Payer: Medicare Other | Admitting: Cardiovascular Disease

## 2017-05-17 ENCOUNTER — Encounter: Payer: Self-pay | Admitting: Cardiovascular Disease

## 2017-05-17 VITALS — BP 154/65 | HR 73 | Ht 62.0 in | Wt 136.0 lb

## 2017-05-17 DIAGNOSIS — I10 Essential (primary) hypertension: Secondary | ICD-10-CM | POA: Diagnosis not present

## 2017-05-17 DIAGNOSIS — E7800 Pure hypercholesterolemia, unspecified: Secondary | ICD-10-CM

## 2017-05-17 DIAGNOSIS — I447 Left bundle-branch block, unspecified: Secondary | ICD-10-CM | POA: Diagnosis not present

## 2017-05-17 DIAGNOSIS — R0989 Other specified symptoms and signs involving the circulatory and respiratory systems: Secondary | ICD-10-CM | POA: Diagnosis not present

## 2017-05-17 NOTE — Progress Notes (Signed)
05/17/2017 Lynn Hayes   1923-06-05  [id number]  Primary Physician Estill Dooms, MD Primary Cardiologist: [name] Renae Gloss  HPI:  The patient is a delightful 83 year old thin-appearing married Caucasian female, mother of 53, grandmother to 2 grandchildren, who I last saw 05/17/16 She has a history of normal coronary arteries by catheterization, which I performed March 21, 2004. At that time, I documented an 80% left renal artery stenosis, which we have been following by duplex ultrasound. This has remained remarkably stable. She has hypertension, hyperlipidemia, and chronic left bundle branch block. She is totally asymptomatic except for occasional atypical Chest pain. She denies chest pain or shortness of breath.She has moved into [facility].   Current Outpatient Prescriptions  Medication Sig Dispense Refill  . aspirin EC 81 MG tablet Take 81 mg by mouth daily.    Marland Kitchen buPROPion (WELLBUTRIN XL) 150 MG 24 hr tablet Take 450 mg by mouth daily.    . cholecalciferol (VITAMIN D) 1000 units tablet Take 1,000 Units by mouth daily.    . DULoxetine (CYMBALTA) 60 MG capsule Take 60 mg by mouth at bedtime.     Marland Kitchen L-Methylfolate (DEPLIN) 7.5 MG TABS Take by mouth. Take one tablet daily    . levothyroxine (SYNTHROID, LEVOTHROID) 50 MCG tablet TAKE ONE TABLET BY MOUTH EVERY MORNING ON AN EMPTY STOMACH FOR THYROID 90 tablet 1  . lithium carbonate 150 MG capsule Take 150 mg by mouth daily.    Marland Kitchen loperamide (IMODIUM A-D) 2 MG tablet 1/2 to 1 tablet daily to control diarrhea 30 tablet 0  . metoprolol succinate (TOPROL-XL) 25 MG 24 hr tablet TAKE 1 TABLET (25 MG TOTAL) BY MOUTH DAILY. 90 tablet 2  . paliperidone (INVEGA) 3 MG 24 hr tablet Take 3 mg by mouth daily.    . ramipril (ALTACE) 10 MG capsule TAKE 1 CAPSULE(S) BY MOUTH DAILY 90 capsule 1  . simvastatin (ZOCOR) 10 MG tablet TAKE 1 TABLET (10 MG TOTAL) BY MOUTH AT BEDTIME. 90 tablet 3   No current  facility-administered medications for this visit.     No Known Allergies  Social History   Social History  . Marital status: Widowed    Spouse name: N/A  . Number of children: 1  . Years of education: N/A   Occupational History  . retired Network engineer     Social History Main Topics  . Smoking status: Never Smoker  . Smokeless tobacco: Never Used  . Alcohol use No  . Drug use: No  . Sexual activity: No   Other Topics Concern  . Not on file   Social History Narrative   Lives at [facility]   Widowed   Never smoked   Alcohol none   Exercise 3 times a week   POA      Diet?       Do you drink/eat things with caffeine?  Yes      Marital status? married                                   What year were you married? 1950      Do you live in a house, apartment, assisted living, condo, trailer, etc.? [place]      Is it one or more stories? Yes (elevators)      How many persons live in your home? 2  Do you have any pets in your home? (please list) none      Current or past profession: Network engineer      Do you exercise?     yes                                 Type & how often? 3x week   Walks with walker      Do you have a living will? yes      Do you have a DNR form?   yes                               If not, do you want to discuss one?      Do you have signed POA/HPOA for forms?         Review of Systems: General: negative for chills, fever, night sweats or weight changes.  Cardiovascular: negative for chest pain, dyspnea on exertion, edema, orthopnea, palpitations, paroxysmal nocturnal dyspnea or shortness of breath Dermatological: negative for rash Respiratory: negative for cough or wheezing Urologic: negative for hematuria Abdominal: negative for nausea, vomiting, diarrhea, bright red blood per rectum, melena, or hematemesis Neurologic: negative for visual changes, syncope, or dizziness All other systems reviewed and are otherwise negative  except as noted above.    Blood pressure (!) 154/65, pulse 73, height 5\' 2"  (1.575 m), weight 136 lb (61.7 kg).  General appearance: alert and no distress Neck: no adenopathy, no JVD, supple, symmetrical, trachea midline, thyroid not enlarged, symmetric, no tenderness/mass/nodules and soft right carotid bruit Lungs: clear to auscultation bilaterally Heart: regular rate and rhythm, S1, S2 normal, no murmur, click, rub or gallop Extremities: extremities normal, atraumatic, no cyanosis or edema  EKG Sinus rhythm at 73 with left bundle branch block. I personally reviewed this EKG.  ASSESSMENT AND PLAN:   Essential hypertension History of essential hypertension with blood pressure measured today at 154/65. She is on metoprolol and ramipril. Continue current meds at current dosing  Hyperlipidemia History of hyperlipidemia on statin therapy with recent lipid profile performed 04/26/17 revealing a total cholesterol 147, LDL 55 and HDL of 77.  Left bundle branch block Chronic  Carotid bruit History of right carotid bruit with Dopplers performed 06/18/13 which were only mildly abnormal.      [name] [facility], [facility] 05/17/2017 11:21 AM

## 2017-05-17 NOTE — Assessment & Plan Note (Signed)
History of right carotid bruit with Dopplers performed 06/18/13 which were only mildly abnormal.

## 2017-05-17 NOTE — Assessment & Plan Note (Signed)
History of essential hypertension with blood pressure measured today at 154/65. She is on metoprolol and ramipril. Continue current meds at current dosing

## 2017-05-17 NOTE — Patient Instructions (Addendum)

## 2017-05-17 NOTE — Assessment & Plan Note (Signed)
Chronic. 

## 2017-05-17 NOTE — Assessment & Plan Note (Signed)
History of hyperlipidemia on statin therapy with recent lipid profile performed 04/26/17 revealing a total cholesterol 147, LDL 55 and HDL of 77.

## 2017-05-22 NOTE — Addendum Note (Signed)
Addended by: Royann Shivers A on: 05/22/2017 03:15 PM   Modules accepted: Orders

## 2017-06-20 ENCOUNTER — Other Ambulatory Visit: Payer: Self-pay | Admitting: Cardiovascular Disease

## 2017-06-26 ENCOUNTER — Other Ambulatory Visit: Payer: Self-pay

## 2017-06-26 DIAGNOSIS — D649 Anemia, unspecified: Secondary | ICD-10-CM

## 2017-06-26 DIAGNOSIS — E034 Atrophy of thyroid (acquired): Secondary | ICD-10-CM

## 2017-08-01 LAB — CBC WITH DIFFERENTIAL/PLATELET
BASOS PCT: 0.5 %
Basophils Absolute: 33 cells/uL (ref 0–200)
Eosinophils Absolute: 198 cells/uL (ref 15–500)
Eosinophils Relative: 3 %
HCT: 30.4 % — ABNORMAL LOW (ref 35.0–45.0)
HEMOGLOBIN: 9.9 g/dL — AB (ref 11.7–15.5)
Lymphs Abs: 858 cells/uL (ref 850–3900)
MCH: 31.4 pg (ref 27.0–33.0)
MCHC: 32.6 g/dL (ref 32.0–36.0)
MCV: 96.5 fL (ref 80.0–100.0)
MONOS PCT: 6.7 %
MPV: 9.5 fL (ref 7.5–12.5)
NEUTROS ABS: 5069 {cells}/uL (ref 1500–7800)
Neutrophils Relative %: 76.8 %
PLATELETS: 254 10*3/uL (ref 140–400)
RBC: 3.15 10*6/uL — AB (ref 3.80–5.10)
RDW: 12 % (ref 11.0–15.0)
Total Lymphocyte: 13 %
WBC mixed population: 442 cells/uL (ref 200–950)
WBC: 6.6 10*3/uL (ref 3.8–10.8)

## 2017-08-01 LAB — BASIC METABOLIC PANEL WITH GFR
BUN/Creatinine Ratio: 16 (calc) (ref 6–22)
BUN: 27 mg/dL — ABNORMAL HIGH (ref 7–25)
CALCIUM: 9.1 mg/dL (ref 8.6–10.4)
CHLORIDE: 112 mmol/L — AB (ref 98–110)
CO2: 21 mmol/L (ref 20–32)
CREATININE: 1.66 mg/dL — AB (ref 0.60–0.88)
GFR, Est African American: 31 mL/min/{1.73_m2} — ABNORMAL LOW (ref >=60)
GFR, Est Non African American: 27 mL/min/{1.73_m2} — ABNORMAL LOW (ref >=60)
GLUCOSE: 102 mg/dL — AB (ref 65–99)
Potassium: 5.2 mmol/L (ref 3.5–5.3)
Sodium: 140 mmol/L (ref 135–146)

## 2017-08-01 LAB — TSH: TSH: 1.84 m[IU]/L (ref 0.40–4.50)

## 2017-08-07 ENCOUNTER — Non-Acute Institutional Stay: Payer: Medicare Other | Admitting: Internal Medicine

## 2017-08-07 ENCOUNTER — Encounter: Payer: Self-pay | Admitting: Internal Medicine

## 2017-08-07 VITALS — BP 126/70 | HR 90 | Temp 97.9°F | Resp 16 | Ht 62.0 in | Wt 138.6 lb

## 2017-08-07 DIAGNOSIS — E034 Atrophy of thyroid (acquired): Secondary | ICD-10-CM

## 2017-08-07 DIAGNOSIS — F319 Bipolar disorder, unspecified: Secondary | ICD-10-CM | POA: Diagnosis not present

## 2017-08-07 DIAGNOSIS — R197 Diarrhea, unspecified: Secondary | ICD-10-CM

## 2017-08-07 DIAGNOSIS — I10 Essential (primary) hypertension: Secondary | ICD-10-CM

## 2017-08-07 DIAGNOSIS — N183 Chronic kidney disease, stage 3 unspecified: Secondary | ICD-10-CM

## 2017-08-07 DIAGNOSIS — D649 Anemia, unspecified: Secondary | ICD-10-CM

## 2017-08-07 DIAGNOSIS — E785 Hyperlipidemia, unspecified: Secondary | ICD-10-CM

## 2017-08-07 MED ORDER — SIMVASTATIN 5 MG PO TABS: 5.0000 mg | ORAL_TABLET | Freq: Every day | ORAL | 3 refills | Status: DC

## 2017-08-07 MED ORDER — LOPERAMIDE HCL 2 MG PO TABS: ORAL_TABLET | ORAL | 0 refills | Status: AC

## 2017-08-07 MED ORDER — FERROUS SULFATE DRIED ER 160 (50 FE) MG PO TBCR: 1.0000 | EXTENDED_RELEASE_TABLET | Freq: Every day | ORAL | 0 refills | Status: DC

## 2017-08-07 NOTE — Progress Notes (Addendum)
[facility]  Provider: Blanchie Serve MD   Location:  [place] of Service:  Clinic (12)  PCP: Blanchie Serve, MD Patient Care Team: Blanchie Serve, MD as PCP - General (Internal Medicine) Lorretta Harp, MD as Consulting Physician (Cardiology) Aquilla Hacker, MD as Referring Physician (Psychiatry) Irene Shipper, MD as Consulting Physician (Gastroenterology) Newton Pigg, MD as Consulting Physician (Obstetrics and Gynecology) Mast, Man X, NP as Nurse Practitioner (Internal Medicine)  Extended Emergency Contact Information Primary Emergency Contact: Hobbs,Diane Address: [address]          [place], [place] [address] [name] of [place] Phone: [phone] Mobile Phone: [phone] Relation: Daughter   Goals of Care: Advanced Directive information Advanced Directives 01/29/2017  Does Patient Have a Medical Advance Directive? Yes  Type of Advance Directive [place] in Chart? -  Would patient like information on creating a medical advance directive? -      Chief Complaint  Patient presents with  . Medical Management of Chronic Issues    3 month follow up. Patient has concerns about diarrhea that comes and goes. She is unsure of the when it started. Last regular bowel movement was 2 weeks ago.   . Medication Refill    No refills needed at this time.   Marland Kitchen Results    Discuss labs  . FYI    Patient has Imodium on her med list but she stated that it didn't help when it was prescribed.     HPI: Patient is a 83 y.o. female seen today for routine visit. She is here with her caregiver Anne Ng who is with her 5 days a week. She helps remind patient to take medications. She gets some assistance with tub bath and dressing. Patient gets around with her rolling walker. No fall reported.   Loose stool- chronic, ongoing for more than a year. On further questioning, had last  bowel movement yesterday with regular stool. She mostly has a bowel movement everyday. Last episode of loose stool was 2 weeks back with semi formed to loose stool of 2-3 episodes that day. Denies blood in stool. Complaints of generalized abdominal discomfort with bowel movement during this episode. Denies any nausea or vomiting.   Hypertension- currently on metoprolol succinate and ramipril.   Hypothyroidism- currently on levothyroxine 50 mcg daily. Taking it at present but after breakfast.   Bipolar disorder- currently on invega, lithium, bupropion and cymbalta, tolerating it well. She follows with psychiatrist.   HLD- currently on simvastatin and tolerating it well.    Past Medical History:  Diagnosis Date  . Anxiety and depression   . Aortic valve disease   . BBB (bundle branch block)    left, chronic  . Bipolar 1 disorder ([place])   . CKD (chronic kidney disease), stage III   . GERD (gastroesophageal reflux disease)   . Herpes zoster   . Hiatal hernia   . Hyperlipidemia   . Hypertension   . Hypertensive CKD (chronic kidney disease)   . Hypothyroid   . Osteoarthritis   . Osteopenia   . Renal artery stenosis (HCC)    left   Past Surgical History:  Procedure Laterality Date  . CARDIAC CATHETERIZATION  03/21/04   essentially normal coronary arteries with moderate decrease in left ventricular function, 80% ostial left renal artery stenosis  . COLONOSCOPY  11/11/2006   Dr. Scarlette Shorts , diverticulosis, colon  polyps  . [place]  . PANENDOSCOPY  11/11/2006   Dr. Scarlette Shorts, esophageal stricture s/p balloon dilation, GERD, Zenker's Diverticulum    reports that she has never smoked. She has never used smokeless tobacco. She reports that she does not drink alcohol or use drugs. Social History   Social History  . Marital status: Widowed    Spouse name: N/A  . Number of children: 1  . Years of education: N/A   Occupational History  . retired Network engineer      Social History Main Topics  . Smoking status: Never Smoker  . Smokeless tobacco: Never Used  . Alcohol use No  . Drug use: No  . Sexual activity: No   Other Topics Concern  . Not on file   Social History Narrative   [facility]ives at [facility]   Widowed   Never smoked   Alcohol none   Exercise 3 times a week   POA      Diet?       Do you drink/eat things with caffeine?  Yes      Marital status? married                                   What year were you married? 1950      Do you live in a house, apartment, assisted living, condo, trailer, etc.? [place]      Is it one or more stories? Yes (elevators)      How many persons live in your home? 2      Do you have any pets in your home? (please list) none      Current or past profession: Network engineer      Do you exercise?     yes                                 Type & how often? 3x week   Walks with walker      Do you have a living will? yes      Do you have a DNR form?   yes                               If not, do you want to discuss one?      Do you have signed POA/HPOA for forms?        Functional Status Survey:    Family History  Problem Relation Age of Onset  . Cancer Father   . Stroke Father   . Stroke Sister 75  . Diabetes Brother   . Heart attack Brother   . Appendicitis Mother   . Diabetes Brother   . Heart disease Brother     Health Maintenance  Topic Date Due  . INFLUENZA VACCINE  09/26/2017 (Originally 06/26/2017)  . DEXA SCAN  11/26/2017 (Originally 10/04/1990)  . TETANUS/TDAP  08/03/2018  . PNA vac Low Risk Adult  Completed    No Known Allergies  Outpatient Encounter Prescriptions as of 08/07/2017  Medication Sig  . aspirin EC 81 MG tablet Take 81 mg by mouth daily.  Marland Kitchen buPROPion (WELLBUTRIN XL) 150 MG 24 hr tablet Take 450 mg by mouth daily.  . cholecalciferol (VITAMIN D) 1000 units tablet Take 1,000 Units by mouth daily.  Marland Kitchen  DULoxetine (CYMBALTA) 60 MG capsule Take 60 mg by  mouth at bedtime.   Marland Kitchen L-Methylfolate (DEPLIN) 7.5 MG TABS Take 1 tablet by mouth daily.   Marland Kitchen levothyroxine (SYNTHROID, LEVOTHROID) 50 MCG tablet TAKE ONE TABLET BY MOUTH EVERY MORNING ON AN EMPTY STOMACH FOR THYROID  . lithium carbonate 150 MG capsule Take 150 mg by mouth daily.  . metoprolol succinate (TOPROL-XL) 25 MG 24 hr tablet TAKE ONE TABLET BY MOUTH DAILY  . paliperidone (INVEGA) 3 MG 24 hr tablet Take 3 mg by mouth daily.  . ramipril (ALTACE) 10 MG capsule TAKE 1 CAPSULE(S) BY MOUTH DAILY  . simvastatin (ZOCOR) 10 MG tablet TAKE 1 TABLET (10 MG TOTAL) BY MOUTH AT BEDTIME.  Marland Kitchen loperamide (IMODIUM A-D) 2 MG tablet 1/2 to 1 tablet daily to control diarrhea (Patient not taking: Reported on 08/07/2017)   No facility-administered encounter medications on file as of 08/07/2017.     Review of Systems  Constitutional: Negative for appetite change, chills, diaphoresis and fever.  HENT: Positive for hearing loss and postnasal drip. Negative for congestion, mouth sores, nosebleeds, sinus pressure, sore throat and trouble swallowing.   Eyes:       Has reading glasses, had cataract surgery, sees eye doctor on regular basis  Respiratory: Negative for cough, choking, chest tightness, shortness of breath and wheezing.   Cardiovascular: Negative for chest pain, palpitations and leg swelling.  Gastrointestinal: Negative for abdominal pain, anal bleeding, blood in stool, constipation, diarrhea, nausea, rectal pain and vomiting.  Genitourinary: Positive for frequency and urgency. Negative for dysuria, pelvic pain, vaginal bleeding and vaginal discharge.       Wakes up 3 times a night to urinate  Musculoskeletal: Positive for gait problem. Negative for arthralgias and back pain.       Uses rolling walker with brake and seat  Skin: Negative for rash and wound.  Neurological: Positive for light-headedness. Negative for dizziness, tremors, syncope, numbness and headaches.  Hematological: Bruises/bleeds  easily.  Psychiatric/Behavioral: Positive for behavioral problems. Negative for confusion, hallucinations, sleep disturbance and suicidal ideas. The patient is not nervous/anxious.     Vitals:   08/07/17 1001  BP: 126/70  Pulse: 90  Resp: 16  Temp: 97.9 F (36.6 C)  TempSrc: Oral  SpO2: 91%  Height: '5\' 2"'  (1.575 m)   There is no height or weight on file to calculate BMI. Physical Exam  Constitutional: She is oriented to person, place, and time. She appears well-developed and well-nourished. No distress.  HENT:  Head: Normocephalic and atraumatic.  Mouth/Throat: Oropharynx is clear and moist. No oropharyngeal exudate.  Hearing aids to both ears  Eyes: Pupils are equal, round, and reactive to light. Conjunctivae and EOM are normal. Right eye exhibits no discharge. Left eye exhibits no discharge.  Neck: Normal range of motion. Neck supple. No thyromegaly present.  Cardiovascular: Normal rate and regular rhythm.   Pulmonary/Chest: Breath sounds normal. No respiratory distress. She has no wheezes. She has no rales.  Abdominal: Soft. Bowel sounds are normal. She exhibits no distension. There is no tenderness. There is no rebound and no guarding.  Musculoskeletal: She exhibits no edema.  Can move all 4 extremities, has arthritis changes to her fingers, uses support of 1 hand to get out of chair, cautious gait, kyphosis present, uses a walker  Lymphadenopathy:    She has no cervical adenopathy.  Neurological: She is alert and oriented to person, place, and time.  Skin: Skin is warm and dry. No rash noted. She  is not diaphoretic. No erythema.  Psychiatric: She has a normal mood and affect. Her behavior is normal.    Labs reviewed: Basic Metabolic Panel:  Recent Labs  12/26/16 0903 04/26/17 0750 07/31/17 0000  NA 142 141 140  K 4.9 4.9 5.2  CL 113* 112* 112*  CO2 '22 21 21  ' GLUCOSE 114* 99 102*  BUN 30* 32* 27*  CREATININE 1.72* 1.87* 1.66*  CALCIUM 8.9 9.2 9.1   Liver  Function Tests:  Recent Labs  11/07/16 1153 12/26/16 0903  AST 17 17  ALT 14 18  ALKPHOS 49 52  BILITOT 0.3 0.3  PROT 5.9* 5.9*  ALBUMIN 3.8 3.6   No results for input(s): LIPASE, AMYLASE in the last 8760 hours. No results for input(s): AMMONIA in the last 8760 hours. CBC:  Recent Labs  12/26/16 0903 04/26/17 0750 07/31/17 0000  WBC 6.5 6.5 6.6  NEUTROABS 4,680 4,680 5,069  HGB 10.0* 9.9* 9.9*  HCT 31.1* 31.4* 30.4*  MCV 97.2 99.4 96.5  PLT 296 283 254   Cardiac Enzymes: No results for input(s): CKTOTAL, CKMB, CKMBINDEX, TROPONINI in the last 8760 hours. BNP: Invalid input(s): POCBNP No results found for: HGBA1C Lab Results  Component Value Date   TSH 1.84 07/31/2017   Lab Results  Component Value Date   VITAMINB12 283 12/26/2016   Lab Results  Component Value Date   FOLATE >24.0 12/26/2016   Lab Results  Component Value Date   IRON 64 12/26/2016   TIBC 304 12/26/2016   FERRITIN 38 12/26/2016    Lipid Panel:  Recent Labs  11/07/16 1153 04/26/17 0750  CHOL 144 147  HDL 71 77  LDLCALC 59 55  TRIG 69 74  CHOLHDL 2.0 1.9   No results found for: HGBA1C  Procedures since last visit: No results found.  Assessment/Plan  1. Diarrhea, unspecified type She has loose to semi formed stool 1-3 episodes every 2-3 weeks. Monitor clinically for now. advised to take imodium only if she has 3 or greater loose stool a day. Maintain hydration. Lytes normal on lab review.  - loperamide (IMODIUM A-D) 2 MG tablet; 1/2 to 1 tablet daily as needed to control diarrhea if you have 3 or more loose stool.  Dispense: 30 tablet; Refill: 0  2. Hyperlipidemia, unspecified hyperlipidemia type LDL at goal on lab review. Decrease simvastatin to 5 mg daily and monitor.  - BMP with eGFR; Future - Lipid Panel; Future - CBC (no diff); Future - simvastatin (ZOCOR) 5 MG tablet; Take 1 tablet (5 mg total) by mouth at bedtime.  Dispense: 90 tablet; Refill: 3  3. Essential  hypertension Stable, continue metoprolol and ramipril for now. Monitor renal function - CBC (no diff); Future  4. Hypothyroidism due to acquired atrophy of thyroid Advised to take levothyroxine empty stomach in morning. Pt and care giver agrees. No change to dosing.  - TSH; Future  5. Stage 3 chronic kidney disease Monitor her renal function - CBC (no diff); Future  6. Anemia, unspecified type Ferritin level normal but towards lower side of normal. Start slow release iron supplement and check ferritin store in 3 month. Normal V91 and folic acid level - CBC (no diff); Future - Ferritin; Future  7. Bipolar disorder Stable mood. On multiple medication as above, reviewed, followed by psych service.    Labs/tests ordered:  As above Next appointment: 3 months  Communication: reviewed care plan with pt, her caregiver and given written instructions on medications.    Barney Gertsch,  MD Internal Medicine [facility] [address] [place], [place] [address] Cell Phone (Monday-Friday 8 am - 5 pm): [phone] On Call: [phone] and follow prompts after 5 pm and on weekends Office Phone: [phone] Office Fax: [phone]

## 2017-09-06 ENCOUNTER — Other Ambulatory Visit: Payer: Self-pay | Admitting: Internal Medicine

## 2017-09-10 ENCOUNTER — Telehealth: Payer: Self-pay | Admitting: Internal Medicine

## 2017-09-10 NOTE — Telephone Encounter (Signed)
Left msg asking pt to call and schedule AWV-S at [facility] clinic. VDM (DD)

## 2017-09-16 ENCOUNTER — Other Ambulatory Visit: Payer: Self-pay | Admitting: Cardiovascular Disease

## 2017-09-16 NOTE — Telephone Encounter (Signed)
REFILL 

## 2017-09-25 NOTE — Telephone Encounter (Signed)
I spoke with pt's caregiver, Anne Ng, to schedule AWV-S.  However, the available date/time slots don't work for the pt.  So, I will call at a later time once I know when the nurse will be back at the clinic.  Anne Ng also asked if pt has had flu shot because she was going to take her to [facility] today to get it.  I spoke with [facility] staff who checked and stated that it hasn't been documented, and I provided that information to [place]. VDM (DD)

## 2017-10-28 ENCOUNTER — Non-Acute Institutional Stay: Payer: Self-pay

## 2017-10-31 ENCOUNTER — Telehealth: Payer: Self-pay

## 2017-10-31 NOTE — Telephone Encounter (Signed)
Patient missed her lab appointment this morning. I was calling to reschedule her appointment for 11/04/17. Patient has an appointment with Dr. Bubba Camp on 11/06/17. I will try to contact the patient again before I leave for the day.

## 2017-11-01 NOTE — Telephone Encounter (Signed)
Was unable to reach the patient. Will try to call back later.

## 2017-11-05 LAB — BASIC METABOLIC PANEL WITH GFR
BUN/Creatinine Ratio: 17 (calc) (ref 6–22)
BUN: 30 mg/dL — AB (ref 7–25)
CALCIUM: 9.5 mg/dL (ref 8.6–10.4)
CHLORIDE: 107 mmol/L (ref 98–110)
CO2: 25 mmol/L (ref 20–32)
Creat: 1.77 mg/dL — ABNORMAL HIGH (ref 0.60–0.88)
GFR, Est African American: 28 mL/min/{1.73_m2} — ABNORMAL LOW (ref >=60)
GFR, Est Non African American: 25 mL/min/{1.73_m2} — ABNORMAL LOW (ref >=60)
Glucose, Bld: 189 mg/dL — ABNORMAL HIGH (ref 65–99)
Potassium: 4.8 mmol/L (ref 3.5–5.3)
SODIUM: 139 mmol/L (ref 135–146)

## 2017-11-05 LAB — LIPID PANEL
CHOL/HDL RATIO: 2.1 (calc) (ref <5.0)
CHOLESTEROL: 147 mg/dL (ref <200)
HDL: 71 mg/dL (ref >50)
LDL Cholesterol (Calc): 55 mg/dL (calc)
NON-HDL CHOLESTEROL (CALC): 76 mg/dL (ref <130)
TRIGLYCERIDES: 125 mg/dL (ref <150)

## 2017-11-05 LAB — CBC
HEMATOCRIT: 32.2 % — AB (ref 35.0–45.0)
Hemoglobin: 10.5 g/dL — ABNORMAL LOW (ref 11.7–15.5)
MCH: 31.4 pg (ref 27.0–33.0)
MCHC: 32.6 g/dL (ref 32.0–36.0)
MCV: 96.4 fL (ref 80.0–100.0)
MPV: 10 fL (ref 7.5–12.5)
PLATELETS: 304 10*3/uL (ref 140–400)
RBC: 3.34 10*6/uL — ABNORMAL LOW (ref 3.80–5.10)
RDW: 11.6 % (ref 11.0–15.0)
WBC: 8.7 10*3/uL (ref 3.8–10.8)

## 2017-11-05 LAB — FERRITIN: FERRITIN: 49 ng/mL (ref 20–288)

## 2017-11-05 LAB — TSH: TSH: 2.09 mIU/L (ref 0.40–4.50)

## 2017-11-06 ENCOUNTER — Encounter: Payer: Self-pay | Admitting: Internal Medicine

## 2017-11-06 ENCOUNTER — Non-Acute Institutional Stay: Payer: Medicare Other | Admitting: Internal Medicine

## 2017-11-06 VITALS — BP 132/64 | HR 80 | Temp 98.0°F | Resp 16 | Ht 62.0 in | Wt 142.8 lb

## 2017-11-06 DIAGNOSIS — R739 Hyperglycemia, unspecified: Secondary | ICD-10-CM

## 2017-11-06 DIAGNOSIS — N183 Chronic kidney disease, stage 3 unspecified: Secondary | ICD-10-CM

## 2017-11-06 DIAGNOSIS — E034 Atrophy of thyroid (acquired): Secondary | ICD-10-CM | POA: Diagnosis not present

## 2017-11-06 DIAGNOSIS — I10 Essential (primary) hypertension: Secondary | ICD-10-CM

## 2017-11-06 DIAGNOSIS — R195 Other fecal abnormalities: Secondary | ICD-10-CM | POA: Diagnosis not present

## 2017-11-06 DIAGNOSIS — D509 Iron deficiency anemia, unspecified: Secondary | ICD-10-CM

## 2017-11-06 MED ORDER — FERROUS SULFATE DRIED ER 160 (50 FE) MG PO TBCR: 1.0000 | EXTENDED_RELEASE_TABLET | Freq: Every day | ORAL | 3 refills | Status: DC

## 2017-11-06 NOTE — Progress Notes (Signed)
[facility]  Provider: Blanchie Serve MD   Location:  [place] of Service:  Clinic (12)  PCP: Blanchie Serve, MD Patient Care Team: Blanchie Serve, MD as PCP - General (Internal Medicine) Lorretta Harp, MD as Consulting Physician (Cardiology) Aquilla Hacker, MD as Referring Physician (Psychiatry) Irene Shipper, MD as Consulting Physician (Gastroenterology) Newton Pigg, MD as Consulting Physician (Obstetrics and Gynecology) Mast, Man X, NP as Nurse Practitioner (Internal Medicine)  Extended Emergency Contact Information Primary Emergency Contact: Hobbs,Diane Address: [address]          [place], [place] [address] [name] of [place] Phone: [phone] Mobile Phone: [phone] Relation: Daughter   Goals of Care: Advanced Directive information Advanced Directives 01/29/2017  Does Patient Have a Medical Advance Directive? Yes  Type of Advance Directive [place] in Chart? -  Would patient like information on creating a medical advance directive? -      Chief Complaint  Patient presents with  . Medical Management of Chronic Issues    3 month follow up. Patient stated that she has been diarrhea.   . Medication Refill    No refills needed at this time.     HPI: Patient is a 83 y.o. female seen today for routine visit.   Loose stools- ongoing. Once a week. Tolerable. No blood or mucus. Imodium helps  Hypothyroidism- taking levothyroxine 50 mcg daily. Tolerating well.  Hypertension- takes metoprolol succinate 25 mg daily and ramipril 10 mg daily. Tolerating it well. Denies any headache, chest pain or dyspnea.   Iron def anemia- not taking her iron slow release daily for now.     Past Medical History:  Diagnosis Date  . Anxiety and depression   . Aortic valve disease   . BBB (bundle branch block)    left, chronic  . Bipolar 1 disorder ([place])   .  CKD (chronic kidney disease), stage III ([place])   . GERD (gastroesophageal reflux disease)   . Herpes zoster   . Hiatal hernia   . Hyperlipidemia   . Hypertension   . Hypertensive CKD (chronic kidney disease)   . Hypothyroid   . Osteoarthritis   . Osteopenia   . Renal artery stenosis (HCC)    left   Past Surgical History:  Procedure Laterality Date  . CARDIAC CATHETERIZATION  03/21/04   essentially normal coronary arteries with moderate decrease in left ventricular function, 80% ostial left renal artery stenosis  . COLONOSCOPY  11/11/2006   Dr. Scarlette Shorts , diverticulosis, colon polyps  . [place]  . PANENDOSCOPY  11/11/2006   Dr. Scarlette Shorts, esophageal stricture s/p balloon dilation, GERD, Zenker's Diverticulum    reports that  has never smoked. she has never used smokeless tobacco. She reports that she does not drink alcohol or use drugs. Social History   Socioeconomic History  . Marital status: Widowed    Spouse name: Not on file  . Number of children: 1  . Years of education: Not on file  . Highest education level: Not on file  Social Needs  . Financial resource strain: Not on file  . Food insecurity - worry: Not on file  . Food insecurity - inability: Not on file  . Transportation needs - medical: Not on file  . Transportation needs - non-medical: Not on file  Occupational History  . Occupation: retired Network engineer   Tobacco  Use  . Smoking status: Never Smoker  . Smokeless tobacco: Never Used  Substance and Sexual Activity  . Alcohol use: No  . Drug use: No  . Sexual activity: No  Other Topics Concern  . Not on file  Social History Narrative   Lives at [facility]   Widowed   Never smoked   Alcohol none   Exercise 3 times a week   POA      Diet?       Do you drink/eat things with caffeine?  Yes      Marital status? married                                   What year were you married? 1950      Do you live in a house, apartment,  assisted living, condo, trailer, etc.? [place]      Is it one or more stories? Yes (elevators)      How many persons live in your home? 2      Do you have any pets in your home? (please list) none      Current or past profession: Network engineer      Do you exercise?     yes                                 Type & how often? 3x week   Walks with walker      Do you have a living will? yes      Do you have a DNR form?   yes                               If not, do you want to discuss one?      Do you have signed POA/HPOA for forms?     Functional Status Survey:    Family History  Problem Relation Age of Onset  . Cancer Father   . Stroke Father   . Stroke Sister 27  . Diabetes Brother   . Heart attack Brother   . Appendicitis Mother   . Diabetes Brother   . Heart disease Brother     Health Maintenance  Topic Date Due  . DEXA SCAN  11/26/2017 (Originally 10/04/1990)  . TETANUS/TDAP  08/03/2018  . INFLUENZA VACCINE  Completed  . PNA vac Low Risk Adult  Completed    No Known Allergies  Outpatient Encounter Medications as of 11/06/2017  Medication Sig  . aspirin EC 81 MG tablet Take 81 mg by mouth daily.  Marland Kitchen buPROPion (WELLBUTRIN XL) 150 MG 24 hr tablet Take 450 mg by mouth daily.  . DULoxetine (CYMBALTA) 60 MG capsule Take 60 mg by mouth at bedtime.   Marland Kitchen L-Methylfolate (DEPLIN) 7.5 MG TABS Take 1 tablet by mouth daily.   Marland Kitchen levothyroxine (SYNTHROID, LEVOTHROID) 50 MCG tablet TAKE ONE TABLET BY MOUTH EVERY MORNING ON AN EMPTY STOMACH FOR THYROID  (MAY BE NEW GENERIC)  . lithium carbonate 150 MG capsule Take 150 mg by mouth daily.  Marland Kitchen loperamide (IMODIUM A-D) 2 MG tablet 1/2 to 1 tablet daily as needed to control diarrhea if you have 3 or more loose stool.  . metoprolol succinate (TOPROL-XL) 25 MG 24 hr tablet TAKE ONE TABLET BY MOUTH DAILY  .  paliperidone (INVEGA) 3 MG 24 hr tablet Take 3 mg by mouth daily.  . ramipril (ALTACE) 10 MG capsule TAKE ONE CAPSULE BY MOUTH  DAILY  . simvastatin (ZOCOR) 10 MG tablet Take 10 mg by mouth daily.  . cholecalciferol (VITAMIN D) 1000 units tablet Take 1,000 Units by mouth daily.  . ferrous sulfate (SLOW IRON) 160 (50 Fe) MG TBCR SR tablet Take 1 tablet (160 mg total) by mouth daily. (Patient not taking: Reported on 11/06/2017)  . [DISCONTINUED] simvastatin (ZOCOR) 5 MG tablet Take 1 tablet (5 mg total) by mouth at bedtime.   No facility-administered encounter medications on file as of 11/06/2017.     Review of Systems  Constitutional: Negative for appetite change and fatigue.  HENT: Positive for congestion, hearing loss and postnasal drip. Negative for mouth sores and trouble swallowing.   Eyes: Positive for visual disturbance.       History of cataract  Respiratory: Negative for cough and shortness of breath.   Cardiovascular: Negative for chest pain, palpitations and leg swelling.  Gastrointestinal: Negative for abdominal pain, anal bleeding, blood in stool, constipation, nausea and vomiting.       Has bowel movement daily- 1 episode of semi formed stool but once a week, she might have 2-3 bowel movement on one day with semi formed to loose stool which is associated with some bloating, discomfort and gasey feeling  Genitourinary: Negative for dysuria, hematuria and pelvic pain.       Nocturia present, has urinary incontinence  Musculoskeletal: Positive for gait problem. Negative for arthralgias, back pain and joint swelling.       Unsteady gait, uses walker, no fall reported  Skin: Negative for rash.  Neurological: Negative for dizziness and headaches.  Psychiatric/Behavioral: Positive for behavioral problems. Negative for sleep disturbance. The patient is not nervous/anxious.     Vitals:   11/06/17 1132  BP: 132/64  Pulse: 80  Resp: 16  Temp: 98 F (36.7 C)  TempSrc: Oral  SpO2: 91%  Weight: 142 lb 12.8 oz (64.8 kg)  Height: 5\' 2"  (1.575 m)   Body mass index is 26.12 kg/m.   Wt Readings from Last 3  Encounters:  11/06/17 142 lb 12.8 oz (64.8 kg)  08/07/17 138 lb 9.6 oz (62.9 kg)  05/17/17 136 lb (61.7 kg)   Physical Exam  Constitutional: She is oriented to person, place, and time. She appears well-developed and well-nourished. No distress.  HENT:  Head: Atraumatic.  Mouth/Throat: Oropharynx is clear and moist.  Hearing aids present  Eyes: Conjunctivae are normal. Pupils are equal, round, and reactive to light.  Neck: Neck supple.  Cardiovascular: Normal rate and regular rhythm.  Pulmonary/Chest: Effort normal and breath sounds normal.  Abdominal: Soft. Bowel sounds are normal. There is no tenderness. There is no guarding.  Musculoskeletal: She exhibits deformity. She exhibits no edema.  Uses a walker to ambulate, arthritis changes to fingers, unsteady gait. Kyphosis +  Lymphadenopathy:    She has no cervical adenopathy.  Neurological: She is alert and oriented to person, place, and time.  Skin: Skin is warm and dry. She is not diaphoretic.  Psychiatric: She has a normal mood and affect. Her behavior is normal.    Labs reviewed: Basic Metabolic Panel: Recent Labs    04/26/17 0750 07/31/17 0000 11/04/17 1000  NA 141 140 139  K 4.9 5.2 4.8  CL 112* 112* 107  CO2 21 21 25   GLUCOSE 99 102* 189*  BUN 32* 27* 30*  CREATININE  1.87* 1.66* 1.77*  CALCIUM 9.2 9.1 9.5   Liver Function Tests: Recent Labs    11/07/16 1153 12/26/16 0903  AST 17 17  ALT 14 18  ALKPHOS 49 52  BILITOT 0.3 0.3  PROT 5.9* 5.9*  ALBUMIN 3.8 3.6   No results for input(s): LIPASE, AMYLASE in the last 8760 hours. No results for input(s): AMMONIA in the last 8760 hours. CBC: Recent Labs    12/26/16 0903 04/26/17 0750 07/31/17 0000 11/04/17 1000  WBC 6.5 6.5 6.6 8.7  NEUTROABS 4,680 4,680 5,069  --   HGB 10.0* 9.9* 9.9* 10.5*  HCT 31.1* 31.4* 30.4* 32.2*  MCV 97.2 99.4 96.5 96.4  PLT 296 283 254 304   Cardiac Enzymes: No results for input(s): CKTOTAL, CKMB, CKMBINDEX, TROPONINI in the  last 8760 hours. BNP: Invalid input(s): POCBNP No results found for: HGBA1C Lab Results  Component Value Date   TSH 2.09 11/04/2017   Lab Results  Component Value Date   VITAMINB12 283 12/26/2016   Lab Results  Component Value Date   FOLATE >24.0 12/26/2016   Lab Results  Component Value Date   IRON 64 12/26/2016   TIBC 304 12/26/2016   FERRITIN 49 11/04/2017    Lipid Panel: Recent Labs    11/07/16 1153 04/26/17 0750 11/04/17 1000  CHOL 144 147 147  HDL 71 77 71  LDLCALC 59 55  --   TRIG 69 74 125  CHOLHDL 2.0 1.9 2.1   No results found for: HGBA1C  Procedures since last visit: No results found.  Assessment/Plan  1. Hyperglycemia With weight gain and elevated cbg, check a1c. Dietary counselling provided - Hemoglobin A1c; Future  2. Loose stools Persists, imodium helps some. Bothering her and she would like to be seen by gastroenterologist.  - Ambulatory referral to Gastroenterology  3. Essential hypertension Stable, continue metoprolol succinate and ramipril  4. Hypothyroidism due to acquired atrophy of thyroid Continue levothyroxine Lab Results  Component Value Date   TSH 2.09 11/04/2017    5. Stage 3 chronic kidney disease (HCC) Monitor bmp periodically, avoid NSAIDs  6. Iron deficiency anemia, unspecified iron deficiency anemia type Start taking iron supplement, pt agrees    Labs/tests ordered:  Lab Orders     Hemoglobin A1c  Next appointment: 3 months  Communication: reviewed care plan with patient    Blanchie Serve, MD Internal Medicine [place],  [address] Cell Phone (Monday-Friday 8 am - 5 pm): [phone] On Call: [phone] and follow prompts after 5 pm and on weekends Office Phone: [phone] Office Fax: [phone]

## 2017-11-06 NOTE — Patient Instructions (Signed)
  Start taking your iron supplement once a day. It is available over the counter.

## 2017-11-18 ENCOUNTER — Encounter: Payer: Self-pay | Admitting: Internal Medicine

## 2017-12-05 ENCOUNTER — Other Ambulatory Visit: Payer: Self-pay | Admitting: Internal Medicine

## 2017-12-11 ENCOUNTER — Other Ambulatory Visit: Payer: Self-pay | Admitting: Cardiovascular Disease

## 2017-12-16 ENCOUNTER — Telehealth: Payer: Self-pay | Admitting: Internal Medicine

## 2017-12-16 NOTE — Telephone Encounter (Signed)
I left a message asking the pt to call me at [phone] to schedule AWV-S at [facility] clinic on morning of 12/18/17 if available. VDM (DD)

## 2018-01-01 ENCOUNTER — Ambulatory Visit: Payer: Medicare Other | Admitting: Internal Medicine

## 2018-01-03 ENCOUNTER — Encounter: Payer: Self-pay | Admitting: Internal Medicine

## 2018-01-03 ENCOUNTER — Ambulatory Visit: Payer: Medicare Other | Admitting: Internal Medicine

## 2018-01-03 VITALS — BP 150/50 | HR 72 | Ht 62.0 in | Wt 140.1 lb

## 2018-01-03 DIAGNOSIS — R159 Full incontinence of feces: Secondary | ICD-10-CM | POA: Diagnosis not present

## 2018-01-03 DIAGNOSIS — R197 Diarrhea, unspecified: Secondary | ICD-10-CM | POA: Diagnosis not present

## 2018-01-03 NOTE — Progress Notes (Signed)
HISTORY OF PRESENT ILLNESS:  Lynn Hayes is a delightful 84 y.o. female , resident of an independent living facility, who presents today after this appointment was scheduled by her daughter with chief complaint of diarrhea and fecal incontinence. The patient was last seen in this office 11/11/2006 when she underwent colonoscopy and upper endoscopy. Colonoscopy to evaluate anemia as well as intermittent urgency with cramping and loose stools. Examination revealed diverticulosis and a diminutive adenomatous colon polyp. A follow-up secondary to age. Upper endoscopy was performed for dysphagia and reflux symptoms. Found to have an esophageal stricture and Zenker's diverticulum. The stricture was dilated with a balloon. Maximum 18 mm. Current complaint is diarrhea for many years. Intermittent with some urgency. No problems with incontinence until recent years. She tells me that she generally has a daily bowel movement but not always. Most bowel movements are formed. Once per week she will experience urgency with diarrhea associated with incontinence most times. She does wear protective undergarments. She will take Imodium after having several loose stools. No significant constipation. No bleeding. No weight loss. Weight gain. Mild cramping. She denies new medications or new diet. She is accompanied by her daughter who lives in [place]:  All non-GI ROS negative unless otherwise stated in the history of present illness except for hearing impairment, excessive urination with urinary frequency  Past Medical History:  Diagnosis Date  . Anxiety and depression   . Aortic valve disease   . BBB (bundle branch block)    left, chronic  . Bipolar 1 disorder ([place])   . CKD (chronic kidney disease), stage III ([place])   . GERD (gastroesophageal reflux disease)   . Herpes zoster   . Hiatal hernia   . Hyperlipidemia   . Hypertension   . Hypertensive CKD (chronic kidney disease)   . Hypothyroid    . Osteoarthritis   . Osteopenia   . Renal artery stenosis (HCC)    left    Past Surgical History:  Procedure Laterality Date  . CARDIAC CATHETERIZATION  03/21/04   essentially normal coronary arteries with moderate decrease in left ventricular function, 80% ostial left renal artery stenosis  . COLONOSCOPY  11/11/2006   Dr. Scarlette Shorts , diverticulosis, colon polyps  . [place]  . PANENDOSCOPY  11/11/2006   Dr. Scarlette Shorts, esophageal stricture s/p balloon dilation, GERD, Zenker's Diverticulum    Social History Lynn Hayes  reports that  has never smoked. she has never used smokeless tobacco. She reports that she does not drink alcohol or use drugs.  family history includes Appendicitis in her mother; Diabetes in her brother and brother; Heart disease in her brother; Stroke in her father; Stroke (age of onset: 2) in her sister.  No Known Allergies     PHYSICAL EXAMINATION: Vital signs: BP (!) 150/50   Pulse 72   Ht 5\' 2"  (1.575 m)   Wt 140 lb 2 oz (63.6 kg)   BMI 25.63 kg/m   Constitutional: generally well-appearing, no acute distress Psychiatric: alert and oriented x3, cooperative Eyes: extraocular movements intact, anicteric, conjunctiva pink Mouth: oral pharynx moist, no lesions Neck: supple no lymphadenopathy Cardiovascular: heart regular rate and rhythm, no murmur Lungs: clear to auscultation bilaterally Abdomen: soft, nontender, nondistended, no obvious ascites, no peritoneal signs, normal bowel sounds, no organomegaly Rectal: No external abnormalities. Absent rectal tone. No internal mass, tenderness, or impaction. Hemoccult negative stool Extremities: no clubbing, cyanosis, or lower extremity edema bilaterally Skin: no lesions on  visible extremities Neuro: No focal deficits. Cranial nerves intact  ASSESSMENT:  #1. Fecal incontinence secondary to absent rectal tone occurring exclusively with episodes of diarrhea #2. History of long-standing  intermittent mild cramping with urgency and loose stools consistent with IBS #3. Colonoscopy 2007 as described #4. History of GERD, located by esophageal stricture status post remote balloon dilation 2007. Currently without symptoms   PLAN:  #1. Metamucil 2 tablespoons daily to improve stool consistency #2. Could consider prophylactic Imodium at low dose, avoiding constipation, if Metamucil not helpful #3. Protective undergarments regularly #4. Bathroom schedule. Reviewed #5. Resume care with PCP. GI follow-up as needed  30 minutes spent face-to-face with the patient. Greater than 50% a time use for counseling the patient and her daughter with regards to her intermittent loose stools and fecal incontinence.

## 2018-01-03 NOTE — Patient Instructions (Signed)
Take 2 tablespoons of Metamucil daily  Continue to wear depends or protective undergarments

## 2018-01-08 ENCOUNTER — Emergency Department (HOSPITAL_COMMUNITY)
Admission: EM | Admit: 2018-01-08 | Discharge: 2018-01-09 | Disposition: A | Discharge status: Skilled Nursing Facility | Payer: Medicare Other | Attending: Emergency Medicine | Admitting: Emergency Medicine

## 2018-01-08 ENCOUNTER — Emergency Department (HOSPITAL_COMMUNITY): Payer: Medicare Other

## 2018-01-08 DIAGNOSIS — Y999 Unspecified external cause status: Secondary | ICD-10-CM | POA: Insufficient documentation

## 2018-01-08 DIAGNOSIS — S2241XA Multiple fractures of ribs, right side, initial encounter for closed fracture: Secondary | ICD-10-CM | POA: Diagnosis not present

## 2018-01-08 DIAGNOSIS — Z7982 Long term (current) use of aspirin: Secondary | ICD-10-CM | POA: Diagnosis not present

## 2018-01-08 DIAGNOSIS — W01190A Fall on same level from slipping, tripping and stumbling with subsequent striking against furniture, initial encounter: Secondary | ICD-10-CM | POA: Insufficient documentation

## 2018-01-08 DIAGNOSIS — I129 Hypertensive chronic kidney disease with stage 1 through stage 4 chronic kidney disease, or unspecified chronic kidney disease: Secondary | ICD-10-CM | POA: Diagnosis not present

## 2018-01-08 DIAGNOSIS — Y929 Unspecified place or not applicable: Secondary | ICD-10-CM | POA: Diagnosis not present

## 2018-01-08 DIAGNOSIS — R0789 Other chest pain: Secondary | ICD-10-CM | POA: Diagnosis present

## 2018-01-08 DIAGNOSIS — N183 Chronic kidney disease, stage 3 (moderate): Secondary | ICD-10-CM | POA: Diagnosis not present

## 2018-01-08 DIAGNOSIS — Y92129 Unspecified place in nursing home as the place of occurrence of the external cause: Secondary | ICD-10-CM | POA: Insufficient documentation

## 2018-01-08 DIAGNOSIS — Y939 Activity, unspecified: Secondary | ICD-10-CM | POA: Diagnosis not present

## 2018-01-08 DIAGNOSIS — Z79899 Other long term (current) drug therapy: Secondary | ICD-10-CM | POA: Diagnosis not present

## 2018-01-08 DIAGNOSIS — F319 Bipolar disorder, unspecified: Secondary | ICD-10-CM | POA: Diagnosis not present

## 2018-01-08 MED ORDER — HYDROCODONE-ACETAMINOPHEN 5-325 MG PO TABS
1.0000 | ORAL_TABLET | Freq: Once | ORAL | Status: AC
  Administered 2018-01-08: 1 via ORAL
  Filled 2018-01-08: qty 1

## 2018-01-08 MED ORDER — HYDROCODONE-ACETAMINOPHEN 5-325 MG PO TABS: 1.0000 | ORAL_TABLET | ORAL | 0 refills | Status: DC | PRN

## 2018-01-08 NOTE — ED Provider Notes (Addendum)
[facility] DEPT Provider Note   CSN: [id number] Arrival date & time: 01/08/18  2054     History   Chief Complaint Chief Complaint  Patient presents with  . Fall    HPI Lynn Hayes is a 84 y.o. female.  84 year old female who is here after sustaining a fall at the nursing home where she struck her right chest.  States she lost her balance.  Denies any head or neck injury.  No dyspnea.  Complains of sharp right-sided chest pain worse with movement.  No complaints of pain from the waist down.  No back discomfort or abdominal discomfort.  No treatment used prior to arrival.      Past Medical History:  Diagnosis Date  . Anxiety and depression   . Aortic valve disease   . BBB (bundle branch block)    left, chronic  . Bipolar 1 disorder ([place])   . CKD (chronic kidney disease), stage III ([place])   . GERD (gastroesophageal reflux disease)   . Herpes zoster   . Hiatal hernia   . Hyperlipidemia   . Hypertension   . Hypertensive CKD (chronic kidney disease)   . Hypothyroid   . Osteoarthritis   . Osteopenia   . Renal artery stenosis [facility])    left    Patient Active Problem List   Diagnosis Date Noted  . Diarrhea 05/07/2017  . Memory loss 02/21/2017  . Anemia 12/25/2016  . Weak 12/25/2016  . Abnormality of gait 12/25/2016  . Aortic valve disorder 11/07/2016  . Bipolar disorder ([place]) 11/07/2016  . Chronic kidney disease 11/07/2016  . Hypothyroidism 11/07/2016  . Chest pain 04/05/2014  . Left bundle branch block 05/28/2013  . Essential hypertension 05/28/2013  . Hyperlipidemia 05/28/2013  . Renal artery stenosis ([place]) 05/28/2013  . Carotid bruit 05/28/2013    Past Surgical History:  Procedure Laterality Date  . CARDIAC CATHETERIZATION  03/21/04   essentially normal coronary arteries with moderate decrease in left ventricular function, 80% ostial left renal artery stenosis  . COLONOSCOPY  11/11/2006   Dr. Scarlette Shorts , diverticulosis,  colon polyps  . [place]  . PANENDOSCOPY  11/11/2006   Dr. Scarlette Shorts, esophageal stricture s/p balloon dilation, GERD, Zenker's Diverticulum    OB History    No data available       Home Medications    Prior to Admission medications   Medication Sig Start Date End Date Taking? Authorizing Provider  aspirin EC 81 MG tablet Take 81 mg by mouth daily.    [provider]  buPROPion (WELLBUTRIN XL) 150 MG 24 hr tablet Take 450 mg by mouth daily.    [provider]  cholecalciferol (VITAMIN D) 1000 units tablet Take 1,000 Units by mouth daily.    [provider]  DULoxetine (CYMBALTA) 60 MG capsule Take 60 mg by mouth at bedtime.     [provider]  ferrous sulfate (SLOW IRON) 160 (50 Fe) MG TBCR SR tablet Take 1 tablet (160 mg total) by mouth daily. Patient not taking: Reported on 01/03/2018 11/06/17   Blanchie Serve, MD  L-Methylfolate (DEPLIN) 7.5 MG TABS Take 1 tablet by mouth daily.     [provider]  levothyroxine (SYNTHROID, LEVOTHROID) 50 MCG tablet TAKE ONE TABLET BY MOUTH EVERY MORNING ON AN EMPTY STOMACH FOR THYROID 12/05/17   Blanchie Serve, MD  lithium carbonate 150 MG capsule Take 150 mg by mouth daily.    [provider]  loperamide (  IMODIUM A-D) 2 MG tablet 1/2 to 1 tablet daily as needed to control diarrhea if you have 3 or more loose stool. Patient taking differently: As needed 08/07/17   Blanchie Serve, MD  metoprolol succinate (TOPROL-XL) 25 MG 24 hr tablet TAKE ONE TABLET BY MOUTH DAILY 12/11/17   Lorretta Harp, MD  paliperidone (INVEGA) 3 MG 24 hr tablet Take 3 mg by mouth daily.    [provider]  ramipril (ALTACE) 10 MG capsule TAKE ONE CAPSULE BY MOUTH DAILY 09/16/17   Lorretta Harp, MD  simvastatin (ZOCOR) 10 MG tablet Take 10 mg by mouth daily.    [provider]    Family History Family History  Problem Relation Age of Onset  . Stroke Father   . Stroke Sister 90    . Diabetes Brother   . Appendicitis Mother   . Diabetes Brother   . Heart disease Brother   . Colon cancer Neg Hx   . Liver cancer Neg Hx     Social History Social History   Tobacco Use  . Smoking status: Never Smoker  . Smokeless tobacco: Never Used  Substance Use Topics  . Alcohol use: No  . Drug use: No     Allergies   Patient has no known allergies.   Review of Systems Review of Systems  All other systems reviewed and are negative.    Physical Exam Updated Vital Signs BP (!) 151/45 (BP Location: Left Arm)   Pulse 79   Temp 98.1 F (36.7 C) (Oral)   Resp 16   SpO2 99%   Physical Exam  Constitutional: She is oriented to person, place, and time. She appears well-developed and well-nourished.  Non-toxic appearance. No distress.  HENT:  Head: Normocephalic and atraumatic.  Eyes: Conjunctivae, EOM and lids are normal. Pupils are equal, round, and reactive to light.  Neck: Normal range of motion. Neck supple. No tracheal deviation present. No thyroid mass present.  Cardiovascular: Normal rate, regular rhythm and normal heart sounds. Exam reveals no gallop.  No murmur heard. Pulmonary/Chest: Effort normal and breath sounds normal. No stridor. No respiratory distress. She has no decreased breath sounds. She has no wheezes. She has no rhonchi. She has no rales.    Abdominal: Soft. Normal appearance and bowel sounds are normal. She exhibits no distension. There is no tenderness. There is no rebound and no CVA tenderness.  Musculoskeletal: Normal range of motion. She exhibits no edema or tenderness.  Neurological: She is alert and oriented to person, place, and time. She has normal strength. No cranial nerve deficit or sensory deficit. GCS eye subscore is 4. GCS verbal subscore is 5. GCS motor subscore is 6.  Skin: Skin is warm and dry. No abrasion and no rash noted.  Psychiatric: She has a normal mood and affect. Her speech is normal and behavior is normal.  Nursing  note and vitals reviewed.    ED Treatments / Results  Labs (all labs ordered are listed, but only abnormal results are displayed) Labs Reviewed - No data to display  EKG  EKG Interpretation None       Radiology No results found.  Procedures Procedures (including critical care time)  Medications Ordered in ED Medications - No data to display   Initial Impression / Assessment and Plan / ED Course  I have reviewed the triage vital signs and the nursing notes.  Pertinent labs & imaging results that were available during my care of the patient were reviewed  by me and considered in my medical decision making (see chart for details).     Patient has evidence of rib fractures on the right side of the eighth and ninth ribs.  Pain is controlled at this time.  Patient will be given an incentive spirometer medicated with hydrocodone.  She is not hypoxic and stable for discharge  Final Clinical Impressions(s) / ED Diagnoses   Final diagnoses:  None    ED Discharge Orders    None       Lacretia Leigh, MD 01/08/18 2246    Lacretia Leigh, MD 01/08/18 2253

## 2018-01-08 NOTE — ED Notes (Signed)
Bed: RESB Expected date:  Expected time:  Means of arrival:  Comments: EMS 84 yo female fall right rib cage-no LOC

## 2018-01-08 NOTE — ED Notes (Signed)
[facility] called for pt 

## 2018-01-08 NOTE — ED Triage Notes (Signed)
Pt BIB [facility]. PT unwitnessed fall at facility was found sitting on her bottom by staff. Pt hit her right flank on a tv stand. Denies LOC, remembers whole event, CAOx4. Pt not on blood thinners. Pt lost footing and fell. Negative for trauma or crepitus.

## 2018-01-17 ENCOUNTER — Non-Acute Institutional Stay: Payer: Medicare Other

## 2018-01-17 VITALS — BP 118/50 | HR 60 | Temp 97.9°F | Ht 62.0 in | Wt 140.0 lb

## 2018-01-17 DIAGNOSIS — Z0000 Encounter for general adult medical examination without abnormal findings: Secondary | ICD-10-CM

## 2018-01-17 DIAGNOSIS — E2839 Other primary ovarian failure: Secondary | ICD-10-CM | POA: Diagnosis not present

## 2018-01-17 MED ORDER — ZOSTER VAC RECOMB ADJUVANTED 50 MCG/0.5ML IM SUSR: 0.5000 mL | Freq: Once | INTRAMUSCULAR | 1 refills | Status: AC

## 2018-01-17 NOTE — Patient Instructions (Signed)
Lynn Hayes , Thank you for taking time to come for your Medicare Wellness Visit. I appreciate your ongoing commitment to your health goals. Please review the following plan we discussed and let me know if I can assist you in the future.   Screening recommendations/referrals: Colonoscopy excluded, you are over age 82 Mammogram excluded, you are over age 49 Bone Density due, ordered Recommended yearly ophthalmology/optometry visit for glaucoma screening and checkup Recommended yearly dental visit for hygiene and checkup  Vaccinations: Influenza vaccine up to date, due fall season Pneumococcal vaccine up to date Tdap vaccine up to date, due 08/03/2018 Shingles vaccine due, prescription sent to pharmacy    Advanced directives: in chart  Conditions/risks identified: none  Next appointment: Dr. Bubba Camp 02/12/2018 @ 10am   Preventive Care 65 Years and Older, Female Preventive care refers to lifestyle choices and visits with your health care provider that can promote health and wellness. What does preventive care include?  A yearly physical exam. This is also called an annual well check.  Dental exams once or twice a year.  Routine eye exams. Ask your health care provider how often you should have your eyes checked.  Personal lifestyle choices, including:  Daily care of your teeth and gums.  Regular physical activity.  Eating a healthy diet.  Avoiding tobacco and drug use.  Limiting alcohol use.  Practicing safe sex.  Taking low-dose aspirin every day.  Taking vitamin and mineral supplements as recommended by your health care provider. What happens during an annual well check? The services and screenings done by your health care provider during your annual well check will depend on your age, overall health, lifestyle risk factors, and family history of disease. Counseling  Your health care provider may ask you questions about your:  Alcohol use.  Tobacco use.  Drug  use.  Emotional well-being.  Home and relationship well-being.  Sexual activity.  Eating habits.  History of falls.  Memory and ability to understand (cognition).  Work and work Statistician.  Reproductive health. Screening  You may have the following tests or measurements:  Height, weight, and BMI.  Blood pressure.  Lipid and cholesterol levels. These may be checked every 5 years, or more frequently if you are over 35 years old.  Skin check.  Lung cancer screening. You may have this screening every year starting at age 78 if you have a 30-pack-year history of smoking and currently smoke or have quit within the past 15 years.  Fecal occult blood test (FOBT) of the stool. You may have this test every year starting at age 82.  Flexible sigmoidoscopy or colonoscopy. You may have a sigmoidoscopy every 5 years or a colonoscopy every 10 years starting at age 27.  Hepatitis C blood test.  Hepatitis B blood test.  Sexually transmitted disease (STD) testing.  Diabetes screening. This is done by checking your blood sugar (glucose) after you have not eaten for a while (fasting). You may have this done every 1-3 years.  Bone density scan. This is done to screen for osteoporosis. You may have this done starting at age 47.  Mammogram. This may be done every 1-2 years. Talk to your health care provider about how often you should have regular mammograms. Talk with your health care provider about your test results, treatment options, and if necessary, the need for more tests. Vaccines  Your health care provider may recommend certain vaccines, such as:  Influenza vaccine. This is recommended every year.  Tetanus, diphtheria, and  acellular pertussis (Tdap, Td) vaccine. You may need a Td booster every 10 years.  Zoster vaccine. You may need this after age 58.  Pneumococcal 13-valent conjugate (PCV13) vaccine. One dose is recommended after age 59.  Pneumococcal polysaccharide  (PPSV23) vaccine. One dose is recommended after age 13. Talk to your health care provider about which screenings and vaccines you need and how often you need them. This information is not intended to replace advice given to you by your health care provider. Make sure you discuss any questions you have with your health care provider. Document Released: 12/09/2015 Document Revised: 08/01/2016 Document Reviewed: 09/13/2015 Elsevier Interactive Patient Education  2017 [place] Prevention in the Home Falls can cause injuries. They can happen to people of all ages. There are many things you can do to make your home safe and to help prevent falls. What can I do on the outside of my home?  Regularly fix the edges of walkways and driveways and fix any cracks.  Remove anything that might make you trip as you walk through a door, such as a raised step or threshold.  Trim any bushes or trees on the path to your home.  Use bright outdoor lighting.  Clear any walking paths of anything that might make someone trip, such as rocks or tools.  Regularly check to see if handrails are loose or broken. Make sure that both sides of any steps have handrails.  Any raised decks and porches should have guardrails on the edges.  Have any leaves, snow, or ice cleared regularly.  Use sand or salt on walking paths during winter.  Clean up any spills in your garage right away. This includes oil or grease spills. What can I do in the bathroom?  Use night lights.  Install grab bars by the toilet and in the tub and shower. Do not use towel bars as grab bars.  Use non-skid mats or decals in the tub or shower.  If you need to sit down in the shower, use a plastic, non-slip stool.  Keep the floor dry. Clean up any water that spills on the floor as soon as it happens.  Remove soap buildup in the tub or shower regularly.  Attach bath mats securely with double-sided non-slip rug tape.  Do not have  throw rugs and other things on the floor that can make you trip. What can I do in the bedroom?  Use night lights.  Make sure that you have a light by your bed that is easy to reach.  Do not use any sheets or blankets that are too big for your bed. They should not hang down onto the floor.  Have a firm chair that has side arms. You can use this for support while you get dressed.  Do not have throw rugs and other things on the floor that can make you trip. What can I do in the kitchen?  Clean up any spills right away.  Avoid walking on wet floors.  Keep items that you use a lot in easy-to-reach places.  If you need to reach something above you, use a strong step stool that has a grab bar.  Keep electrical cords out of the way.  Do not use floor polish or wax that makes floors slippery. If you must use wax, use non-skid floor wax.  Do not have throw rugs and other things on the floor that can make you trip. What can I do with my stairs?  Do not leave any items on the stairs.  Make sure that there are handrails on both sides of the stairs and use them. Fix handrails that are broken or loose. Make sure that handrails are as long as the stairways.  Check any carpeting to make sure that it is firmly attached to the stairs. Fix any carpet that is loose or worn.  Avoid having throw rugs at the top or bottom of the stairs. If you do have throw rugs, attach them to the floor with carpet tape.  Make sure that you have a light switch at the top of the stairs and the bottom of the stairs. If you do not have them, ask someone to add them for you. What else can I do to help prevent falls?  Wear shoes that:  Do not have high heels.  Have rubber bottoms.  Are comfortable and fit you well.  Are closed at the toe. Do not wear sandals.  If you use a stepladder:  Make sure that it is fully opened. Do not climb a closed stepladder.  Make sure that both sides of the stepladder are  locked into place.  Ask someone to hold it for you, if possible.  Clearly mark and make sure that you can see:  Any grab bars or handrails.  First and last steps.  Where the edge of each step is.  Use tools that help you move around (mobility aids) if they are needed. These include:  Canes.  Walkers.  Scooters.  Crutches.  Turn on the lights when you go into a dark area. Replace any light bulbs as soon as they burn out.  Set up your furniture so you have a clear path. Avoid moving your furniture around.  If any of your floors are uneven, fix them.  If there are any pets around you, be aware of where they are.  Review your medicines with your doctor. Some medicines can make you feel dizzy. This can increase your chance of falling. Ask your doctor what other things that you can do to help prevent falls. This information is not intended to replace advice given to you by your health care provider. Make sure you discuss any questions you have with your health care provider. Document Released: 09/08/2009 Document Revised: 04/19/2016 Document Reviewed: 12/17/2014 Elsevier Interactive Patient Education  2017 [employer].

## 2018-01-17 NOTE — Progress Notes (Signed)
Subjective:   Lynn Hayes is a 84 y.o. female who presents for Medicare Annual (Subsequent) preventive examination at [facility]       Objective:     Vitals: There were no vitals taken for this visit.  There is no height or weight on file to calculate BMI.  Advanced Directives 01/17/2018 01/08/2018 01/29/2017 12/25/2016 12/18/2016 11/07/2016 07/10/2016  Does Patient Have a Medical Advance Directive? Yes No Yes Yes No Yes No  Type of Advance Directive Healthcare Power of [place] -  Does patient want to make changes to medical advance directive? No - Patient declined - - - - - -  Copy of [place] in Chart? Yes - - Yes - No - copy requested -  Would patient like information on creating a medical advance directive? No - Patient declined No - Patient declined - - No - Patient declined - No - patient declined information    Tobacco Social History   Tobacco Use  Smoking Status Never Smoker  Smokeless Tobacco Never Used     Counseling given: Not Answered   Clinical Intake:  Pre-visit preparation completed: No  Pain : 0-10 Pain Score: 7  Pain Type: Acute pain Pain Location: Rib cage Pain Orientation: Right Pain Descriptors / Indicators: Aching Pain Onset: In the past 7 days     Nutritional Risks: None Diabetes: No  How often do you need to have someone help you when you read instructions, pamphlets, or other written materials from your doctor or pharmacy?: 1 - Never What is the last grade level you completed in school?: HS  Interpreter Needed?: No  Information entered by :: Tyson Dense, RN  Past Medical History:  Diagnosis Date  . Anxiety and depression   . Aortic valve disease   . BBB (bundle branch block)    left, chronic  . Bipolar 1 disorder ([place])   . CKD (chronic kidney disease), stage III ([place])   . GERD  (gastroesophageal reflux disease)   . Herpes zoster   . Hiatal hernia   . Hyperlipidemia   . Hypertension   . Hypertensive CKD (chronic kidney disease)   . Hypothyroid   . Osteoarthritis   . Osteopenia   . Renal artery stenosis ([facility])    left   Past Surgical History:  Procedure Laterality Date  . CARDIAC CATHETERIZATION  03/21/04   essentially normal coronary arteries with moderate decrease in left ventricular function, 80% ostial left renal artery stenosis  . COLONOSCOPY  11/11/2006   Dr. Scarlette Shorts , diverticulosis, colon polyps  . [place]  . PANENDOSCOPY  11/11/2006   Dr. Scarlette Shorts, esophageal stricture s/p balloon dilation, GERD, Zenker's Diverticulum   Family History  Problem Relation Age of Onset  . Stroke Father   . Stroke Sister 37  . Diabetes Brother   . Appendicitis Mother   . Diabetes Brother   . Heart disease Brother   . Colon cancer Neg Hx   . Liver cancer Neg Hx    Social History   Socioeconomic History  . Marital status: Widowed    Spouse name: None  . Number of children: 1  . Years of education: None  . Highest education level: None  Social Needs  . Financial resource strain: Not hard at all  . Food insecurity - worry: Never true  . Food insecurity - inability:  Never true  . Transportation needs - medical: No  . Transportation needs - non-medical: No  Occupational History  . Occupation: retired Network engineer   Tobacco Use  . Smoking status: Never Smoker  . Smokeless tobacco: Never Used  Substance and Sexual Activity  . Alcohol use: No  . Drug use: No  . Sexual activity: No  Other Topics Concern  . None  Social History Narrative   Lives at [facility]   Widowed   Never smoked   Alcohol none   Exercise 3 times a week   POA      Diet?       Do you drink/eat things with caffeine?  Yes      Marital status? married                                   What year were you married? 1950      Do you live in a house,  apartment, assisted living, condo, trailer, etc.? [place]      Is it one or more stories? Yes (elevators)      How many persons live in your home? 2      Do you have any pets in your home? (please list) none      Current or past profession: Network engineer      Do you exercise?     yes                                 Type & how often? 3x week   Walks with walker      Do you have a living will? yes      Do you have a DNR form?   yes                               If not, do you want to discuss one?      Do you have signed POA/HPOA for forms?     Outpatient Encounter Medications as of 01/17/2018  Medication Sig  . aspirin EC 81 MG tablet Take 81 mg by mouth daily.  Marland Kitchen buPROPion (WELLBUTRIN XL) 150 MG 24 hr tablet Take 450 mg by mouth daily.  . DULoxetine (CYMBALTA) 60 MG capsule Take 60 mg by mouth at bedtime.   . ferrous sulfate (SLOW IRON) 160 (50 Fe) MG TBCR SR tablet Take 1 tablet (160 mg total) by mouth daily.  Marland Kitchen L-Methylfolate (DEPLIN) 7.5 MG TABS Take 1 tablet by mouth daily.   Marland Kitchen levothyroxine (SYNTHROID, LEVOTHROID) 50 MCG tablet TAKE ONE TABLET BY MOUTH EVERY MORNING ON AN EMPTY STOMACH FOR THYROID (Patient taking differently: Take 25mcg by mouth each morning on an empty stomach)  . lithium carbonate 150 MG capsule Take 150 mg by mouth daily.  Marland Kitchen loperamide (IMODIUM A-D) 2 MG tablet 1/2 to 1 tablet daily as needed to control diarrhea if you have 3 or more loose stool. (Patient taking differently: Take 1-2 mg by mouth as needed for diarrhea or loose stools. As needed)  . metoprolol succinate (TOPROL-XL) 25 MG 24 hr tablet TAKE ONE TABLET BY MOUTH DAILY (Patient taking differently: Take 25mg  by mouth daily)  . paliperidone (INVEGA) 3 MG 24 hr tablet Take 3 mg by mouth daily.  . ramipril (ALTACE) 10 MG  capsule TAKE ONE CAPSULE BY MOUTH DAILY (Patient taking differently: Take 10mg  by mouth daily)  . simvastatin (ZOCOR) 10 MG tablet Take 10 mg by mouth daily.  Marland Kitchen  HYDROcodone-acetaminophen (NORCO/VICODIN) 5-325 MG tablet Take 1-2 tablets by mouth every 4 (four) hours as needed. (Patient not taking: Reported on 01/17/2018)   No facility-administered encounter medications on file as of 01/17/2018.     Activities of Daily Living In your present state of health, do you have any difficulty performing the following activities: 01/17/2018  Hearing? N  Vision? N  Difficulty concentrating or making decisions? N  Walking or climbing stairs? N  Dressing or bathing? Y  Doing errands, shopping? N  Preparing Food and eating ? N  Using the Toilet? N  In the past six months, have you accidently leaked urine? Y  Do you have problems with loss of bowel control? Y  Managing your Medications? N  Managing your Finances? N  Housekeeping or managing your Housekeeping? N  Some recent data might be hidden    Patient Care Team: Blanchie Serve, MD as PCP - General (Internal Medicine) Lorretta Harp, MD as Consulting Physician (Cardiology) Aquilla Hacker, MD as Referring Physician (Psychiatry) Irene Shipper, MD as Consulting Physician (Gastroenterology) Newton Pigg, MD as Consulting Physician (Obstetrics and Gynecology) Mast, Man X, NP as Nurse Practitioner (Internal Medicine)    Assessment:   This is a routine wellness examination for Lynn Hayes.  Exercise Activities and Dietary recommendations Current Exercise Habits: Structured exercise class, Type of exercise: calisthenics, Time (Minutes): 30, Frequency (Times/Week): 2, Weekly Exercise (Minutes/Week): 60, Exercise limited by: None identified  Goals    None      Fall Risk Fall Risk  01/17/2018 11/07/2016 07/10/2016  Falls in the past year? Yes No Yes  Number falls in past yr: 2 or more - 1  Injury with Fall? Yes - Yes  Comment broken ribs - -   Is the patient's home free of loose throw rugs in walkways, pet beds, electrical cords, etc?   yes      Grab bars in the bathroom? yes      Handrails on the  stairs?   yes      Adequate lighting?   yes  Timed Get Up and Go performed: 20 seconds, fall risk  Depression Screen PHQ 2/9 Scores 01/17/2018 08/07/2017 07/10/2016  PHQ - 2 Score 0 0 0  PHQ- 9 Score - 0 -     Cognitive Function        Immunization History  Administered Date(s) Administered  . Influenza-Unspecified 08/09/2009, 08/28/2010, 09/11/2011, 09/09/2012, 08/27/2013, 11/26/2013, 09/07/2014, 08/27/2015, 08/08/2016  . Pneumococcal Conjugate-13 06/10/2014  . Pneumococcal Polysaccharide-23 01/16/2010, 08/28/2010, 09/11/2011  . Pneumococcal-Unspecified 08/27/2015  . Td 08/03/2008  . Zoster 01/16/2010, 07/30/2011    Qualifies for Shingles Vaccine? Yes, educated and prescription sent to pharmacy  Screening Tests Health Maintenance  Topic Date Due  . DEXA SCAN  10/04/1990  . TETANUS/TDAP  08/03/2018  . INFLUENZA VACCINE  Completed  . PNA vac Low Risk Adult  Completed    Cancer Screenings: Lung: Low Dose CT Chest recommended if Age 79-80 years, 30 pack-year currently smoking OR have quit w/in 15years. Patient does not qualify. Breast:  Up to date on Mammogram? Yes   Up to date of Bone Density/Dexa? No, ordered Colorectal: up to date  Additional Screenings:  Hepatitis C Screening: declined     Plan:    I have personally reviewed and addressed the Medicare Annual Wellness  questionnaire and have noted the following in the patient's chart:  A. Medical and social history B. Use of alcohol, tobacco or illicit drugs  C. Current medications and supplements D. Functional ability and status E.  Nutritional status F.  Physical activity G. Advance directives H. List of other physicians I.  Hospitalizations, surgeries, and ER visits in previous 12 months J.  [place] to include hearing, vision, cognitive, depression L. Referrals and appointments - none  In addition, I have reviewed and discussed with patient certain preventive protocols, quality metrics, and  best practice recommendations. A written personalized care plan for preventive services as well as general preventive health recommendations were provided to patient.  See attached scanned questionnaire for additional information.   Signed,   Tyson Dense, RN Nurse Health Advisor  Patient Concerns: None

## 2018-02-03 DIAGNOSIS — R739 Hyperglycemia, unspecified: Secondary | ICD-10-CM

## 2018-02-04 LAB — HEMOGLOBIN A1C
HEMOGLOBIN A1C: 5.3 %{Hb} (ref <5.7)
Mean Plasma Glucose: 105 (calc)
eAG (mmol/L): 5.8 (calc)

## 2018-02-11 ENCOUNTER — Encounter: Payer: Self-pay | Admitting: Internal Medicine

## 2018-02-12 ENCOUNTER — Ambulatory Visit: Payer: Medicare Other | Admitting: Internal Medicine

## 2018-02-12 ENCOUNTER — Encounter: Payer: Self-pay | Admitting: Internal Medicine

## 2018-02-12 ENCOUNTER — Other Ambulatory Visit: Payer: Self-pay

## 2018-02-12 VITALS — BP 116/68 | HR 65 | Temp 98.0°F | Resp 16 | Ht 62.0 in | Wt 141.2 lb

## 2018-02-12 DIAGNOSIS — F319 Bipolar disorder, unspecified: Secondary | ICD-10-CM

## 2018-02-12 DIAGNOSIS — N183 Chronic kidney disease, stage 3 unspecified: Secondary | ICD-10-CM

## 2018-02-12 DIAGNOSIS — I447 Left bundle-branch block, unspecified: Secondary | ICD-10-CM | POA: Diagnosis not present

## 2018-02-12 DIAGNOSIS — I701 Atherosclerosis of renal artery: Secondary | ICD-10-CM | POA: Diagnosis not present

## 2018-02-12 DIAGNOSIS — S2241XS Multiple fractures of ribs, right side, sequela: Secondary | ICD-10-CM

## 2018-02-12 DIAGNOSIS — S2241XA Multiple fractures of ribs, right side, initial encounter for closed fracture: Secondary | ICD-10-CM | POA: Insufficient documentation

## 2018-02-12 DIAGNOSIS — D638 Anemia in other chronic diseases classified elsewhere: Secondary | ICD-10-CM

## 2018-02-12 DIAGNOSIS — E7800 Pure hypercholesterolemia, unspecified: Secondary | ICD-10-CM | POA: Diagnosis not present

## 2018-02-12 DIAGNOSIS — E034 Atrophy of thyroid (acquired): Secondary | ICD-10-CM

## 2018-02-12 DIAGNOSIS — R269 Unspecified abnormalities of gait and mobility: Secondary | ICD-10-CM

## 2018-02-12 DIAGNOSIS — I10 Essential (primary) hypertension: Secondary | ICD-10-CM | POA: Diagnosis not present

## 2018-02-12 DIAGNOSIS — G3184 Mild cognitive impairment, so stated: Secondary | ICD-10-CM

## 2018-02-12 DIAGNOSIS — Z0000 Encounter for general adult medical examination without abnormal findings: Secondary | ICD-10-CM

## 2018-02-12 NOTE — Progress Notes (Signed)
[facility]  Provider: Blanchie Serve MD   Location:      Place of Service:     PCP: Blanchie Serve, MD Patient Care Team: Blanchie Serve, MD as PCP - General (Internal Medicine) Lorretta Harp, MD as Consulting Physician (Cardiology) Aquilla Hacker, MD as Referring Physician (Psychiatry) Irene Shipper, MD as Consulting Physician (Gastroenterology) Newton Pigg, MD as Consulting Physician (Obstetrics and Gynecology) Mast, Man X, NP as Nurse Practitioner (Internal Medicine)  Extended Emergency Contact Information Primary Emergency Contact: Hobbs,Diane Address: [address]          [place],  [address] [name] of [place] Phone: [phone] Mobile Phone: [phone] Relation: Daughter   Goals of Care: Advanced Directive information Advanced Directives 02/12/2018  Does Patient Have a Medical Advance Directive? Yes  Type of Paramedic of [place];Living will  Does patient want to make changes to medical advance directive? No - Patient declined  Copy of [place] in Chart? Yes  Would patient like information on creating a medical advance directive? -      Chief Complaint  Patient presents with  . Annual Exam    annual exam  . Medication Refill    No refills needed at this time  . Results    Discuss labs   . MMSE    25/30. Failed clock drawing     HPI: Patient is a 84 y.o. female seen today for annual visit. She lost her balance and fell and hit her right chest wall on 01/08/18 and sustained 8th and 9th rib fracture, required ED vist and pain management was recommended. Was using incentive spirometer and has now stopped. Denies pain.   Hypothyroidism- currently on levothyroxine 50 mcg daily. Denies constipation.   Iron deficiency anemia- taking ferrous sulfate 160 mg daily. Moving bowel regularly  Loose stools- improved. Imodium helps and did not require any imodium last  week.  Hypertension- currently on metoprolol succinate 25 mg daily with ramipril 10 mg daily with baby aspirin. Denies headache, chest pain, dyspnea or abdominal pain.   Chronic depression- takes cymbalta 60 mg daily and bupropion 450 mg daily with invega and lithium. Followed by psychiatry service. Also on methylfolate supplement.   Hyperlipidemia- currently on simvastatin 10 mg daily, tolerating that well, denies myalgia  Renal artery stenosis- left renal artery stenosis of 80%, followed by ultrasound by cardiology and stable  Chronic LBBB- chest pain free overall stable, follows with Dr Gwenlyn Found. Denies palpitations.   Right rib fracture- post fall. No further fall reported. On norco 5-325 mg 1-2 tablet every 4 hours as needed and has not required it.     Past Medical History:  Diagnosis Date  . Anxiety and depression   . Aortic valve disease   . BBB (bundle branch block)    left, chronic  . Bipolar 1 disorder ([place])   . CKD (chronic kidney disease), stage III ([place])   . GERD (gastroesophageal reflux disease)   . Herpes zoster   . Hiatal hernia   . Hyperlipidemia   . Hypertension   . Hypertensive CKD (chronic kidney disease)   . Hypothyroid   . Osteoarthritis   . Osteopenia   . Renal artery stenosis (HCC)    left   Past Surgical History:  Procedure Laterality Date  . CARDIAC CATHETERIZATION  03/21/04   essentially normal coronary arteries with moderate decrease in left ventricular function, 80% ostial left renal artery stenosis  . COLONOSCOPY  11/11/2006   Dr. Scarlette Shorts , diverticulosis, colon polyps  . [place]  . PANENDOSCOPY  11/11/2006   Dr. Scarlette Shorts, esophageal stricture s/p balloon dilation, GERD, Zenker's Diverticulum    reports that  has never smoked. she has never used smokeless tobacco. She reports that she does not drink alcohol or use drugs. Social History   Socioeconomic History  . Marital status: Widowed    Spouse name: Not on file  .  Number of children: 1  . Years of education: Not on file  . Highest education level: Not on file  Social Needs  . Financial resource strain: Not hard at all  . Food insecurity - worry: Never true  . Food insecurity - inability: Never true  . Transportation needs - medical: No  . Transportation needs - non-medical: No  Occupational History  . Occupation: retired Network engineer   Tobacco Use  . Smoking status: Never Smoker  . Smokeless tobacco: Never Used  Substance and Sexual Activity  . Alcohol use: No  . Drug use: No  . Sexual activity: No  Other Topics Concern  . Not on file  Social History Narrative   [facility] at [facility]   Widowed   Never smoked   Alcohol none   Exercise 3 times a week   POA      Diet?       Do you drink/eat things with caffeine?  Yes      Marital status? married                                   What year were you married? 1950      Do you live in a house, apartment, assisted living, condo, trailer, etc.? [place]      Is it one or more stories? Yes (elevators)      How many persons live in your home? 2      Do you have any pets in your home? (please list) none      Current or past profession: Network engineer      Do you exercise?     yes                                 Type & how often? 3x week   Walks with walker      Do you have a living will? yes      Do you have a DNR form?   yes                               If not, do you want to discuss one?      Do you have signed POA/HPOA for forms?     Functional Status Survey:    Family History  Problem Relation Age of Onset  . Stroke Father   . Stroke Sister 59  . Diabetes Brother   . Appendicitis Mother   . Diabetes Brother   . Heart disease Brother   . Colon cancer Neg Hx   . Liver cancer Neg Hx     Health Maintenance  Topic Date Due  . DEXA SCAN  10/04/1990  . TETANUS/TDAP  08/03/2018  . INFLUENZA VACCINE  Completed  . PNA vac Low Risk Adult  Completed  No Known  Allergies  Outpatient Encounter Medications as of 02/12/2018  Medication Sig  . aspirin EC 81 MG tablet Take 81 mg by mouth daily.  Marland Kitchen buPROPion (WELLBUTRIN XL) 150 MG 24 hr tablet Take 450 mg by mouth daily.  . DULoxetine (CYMBALTA) 60 MG capsule Take 60 mg by mouth at bedtime.   . ferrous sulfate (SLOW IRON) 160 (50 Fe) MG TBCR SR tablet Take 1 tablet (160 mg total) by mouth daily.  Marland Kitchen HYDROcodone-acetaminophen (NORCO/VICODIN) 5-325 MG tablet Take 1-2 tablets by mouth every 4 (four) hours as needed.  Marland Kitchen L-Methylfolate (DEPLIN) 7.5 MG TABS Take 1 tablet by mouth daily.   Marland Kitchen levothyroxine (SYNTHROID, LEVOTHROID) 50 MCG tablet TAKE ONE TABLET BY MOUTH EVERY MORNING ON AN EMPTY STOMACH FOR THYROID  . lithium carbonate 150 MG capsule Take 150 mg by mouth daily.  Marland Kitchen loperamide (IMODIUM A-D) 2 MG tablet 1/2 to 1 tablet daily as needed to control diarrhea if you have 3 or more loose stool.  . metoprolol succinate (TOPROL-XL) 25 MG 24 hr tablet TAKE ONE TABLET BY MOUTH DAILY  . paliperidone (INVEGA) 3 MG 24 hr tablet Take 3 mg by mouth daily.  . ramipril (ALTACE) 10 MG capsule TAKE ONE CAPSULE BY MOUTH DAILY  . simvastatin (ZOCOR) 10 MG tablet Take 10 mg by mouth daily.   No facility-administered encounter medications on file as of 02/12/2018.     Review of Systems  Constitutional: Negative for appetite change, chills and fever.  HENT: Positive for hearing loss and rhinorrhea. Negative for congestion, ear discharge, ear pain, mouth sores, postnasal drip, sinus pressure, sinus pain, sore throat, tinnitus and trouble swallowing.   Eyes: Positive for visual disturbance. Negative for pain and itching.       Has reading glasses  Respiratory: Positive for cough. Negative for choking, chest tightness, shortness of breath and wheezing.        Occasional dry cough  Cardiovascular: Negative for chest pain, palpitations and leg swelling.  Gastrointestinal: Negative for abdominal pain, blood in stool,  constipation, nausea and vomiting.  Genitourinary: Positive for frequency. Negative for dysuria, hematuria, pelvic pain and vaginal discharge.  Musculoskeletal: Positive for back pain and gait problem. Negative for arthralgias.       Uses walker for ambulation  Skin: Negative for rash and wound.  Neurological: Negative for dizziness, syncope, numbness and headaches.  Psychiatric/Behavioral: Positive for confusion and sleep disturbance. Negative for dysphoric mood and hallucinations. The patient is not nervous/anxious.     Vitals:   02/12/18 1003  BP: 116/68  Pulse: 65  Resp: 16  Temp: 98 F (36.7 C)  TempSrc: Oral  SpO2: 94%  Weight: 141 lb 3.2 oz (64 kg)  Height: '5\' 2"'  (1.575 m)   Body mass index is 25.83 kg/m.   Wt Readings from Last 3 Encounters:  02/12/18 141 lb 3.2 oz (64 kg)  01/17/18 140 lb (63.5 kg)  01/03/18 140 lb 2 oz (63.6 kg)   Physical Exam  Constitutional: She is oriented to person, place, and time. She appears well-developed and well-nourished. No distress.  HENT:  Head: Normocephalic and atraumatic.  Right Ear: External ear normal.  Left Ear: External ear normal.  Nose: Nose normal.  Mouth/Throat: Oropharynx is clear and moist. No oropharyngeal exudate.  Hearing aids to both ears  Eyes: Conjunctivae and EOM are normal. Pupils are equal, round, and reactive to light. Right eye exhibits no discharge. Left eye exhibits no discharge. No scleral icterus.  Neck: Normal range  of motion. Neck supple. No thyromegaly present.  Cardiovascular: Normal rate, regular rhythm, normal heart sounds and intact distal pulses.  Pulmonary/Chest: Effort normal and breath sounds normal. No respiratory distress. She has no wheezes. She has no rales. She exhibits no tenderness.  Abdominal: Soft. Bowel sounds are normal. She exhibits no mass. There is no tenderness. There is no guarding.  Musculoskeletal: She exhibits no edema or tenderness.  Able to move all 4 extremities, unsteady  gait, uses walker, arthritis changes to fingers, kyphosis present  Lymphadenopathy:    She has no cervical adenopathy.  Neurological: She is alert and oriented to person, place, and time. She exhibits normal muscle tone.  02/14/18 MMSE 26/30  Skin: Skin is warm and dry. No rash noted. She is not diaphoretic.  Psychiatric: She has a normal mood and affect. Her behavior is normal.   MMSE - Mini Mental State Exam 02/12/2018 01/17/2018  Not completed: (No Data) -  Orientation to time 4 5  Orientation to Place 5 5  Registration 3 3  Attention/ Calculation 4 4  Recall 1 1  Language- name 2 objects 2 2  Language- repeat 1 1  Language- follow 3 step command 3 3  Language- read & follow direction 1 1  Write a sentence 1 1  Copy design 0 0  Total score 25 26    Labs reviewed: Basic Metabolic Panel: Recent Labs    04/26/17 0750 07/31/17 0000 11/04/17 1000  NA 141 140 139  K 4.9 5.2 4.8  CL 112* 112* 107  CO2 '21 21 25  ' GLUCOSE 99 102* 189*  BUN 32* 27* 30*  CREATININE 1.87* 1.66* 1.77*  CALCIUM 9.2 9.1 9.5   Liver Function Tests: No results for input(s): AST, ALT, ALKPHOS, BILITOT, PROT, ALBUMIN in the last 8760 hours. No results for input(s): LIPASE, AMYLASE in the last 8760 hours. No results for input(s): AMMONIA in the last 8760 hours. CBC: Recent Labs    04/26/17 0750 07/31/17 0000 11/04/17 1000  WBC 6.5 6.6 8.7  NEUTROABS 4,680 5,069  --   HGB 9.9* 9.9* 10.5*  HCT 31.4* 30.4* 32.2*  MCV 99.4 96.5 96.4  PLT 283 254 304   Cardiac Enzymes: No results for input(s): CKTOTAL, CKMB, CKMBINDEX, TROPONINI in the last 8760 hours. BNP: Invalid input(s): POCBNP Lab Results  Component Value Date   HGBA1C 5.3 01/31/2018   Lab Results  Component Value Date   TSH 2.09 11/04/2017   Lab Results  Component Value Date   VITAMINB12 283 12/26/2016   Lab Results  Component Value Date   FOLATE >24.0 12/26/2016   Lab Results  Component Value Date   IRON 64 12/26/2016    TIBC 304 12/26/2016   FERRITIN 49 11/04/2017    Lipid Panel: Recent Labs    04/26/17 0750 11/04/17 1000  CHOL 147 147  HDL 77 71  LDLCALC 55 55  TRIG 74 125  CHOLHDL 1.9 2.1   Lab Results  Component Value Date   HGBA1C 5.3 01/31/2018    Procedures since last visit: No results found.  Assessment/Plan  1. Essential hypertension Continue ramipril and metoprolol succinate - CMP with eGFR([facility]); Future  2. Hypothyroidism due to acquired atrophy of thyroid Reviewed TSH. Continue levothyroxine.  - TSH; Future  3. Pure hypercholesterolemia Continue statin, LDL at goal - Lipid Panel; Future  4. Bipolar affective disorder, remission status unspecified ([place]) Continue her psychiatry medications, no changes made  5. Renal artery stenosis (HCC) Left renal artery has 80%  stenosis. monitor  6. Abnormality of gait Fall precautions, walker for ambulation - Vitamin D, 1,25-dihydroxy; Future  7. Left bundle branch block Chronic, stable - CBC (no diff); Future  8. Mild cognitive impairment MMSE reviewed. Her age, history of hypothyroidism could be contributing some. Patient at present refuses brain imaging. B12 stable. Supportive care - CBC (no diff); Future - TSH; Future - Vitamin D, 1,25-dihydroxy; Future  9. Closed fracture of multiple ribs of right side, sequela Pain free. Discontinue prn norco for non use. Monitor.  - Vitamin D, 1,25-dihydroxy; Future  10. CKD (chronic kidney disease) stage 3, GFR 30-59 ml/min (HCC) With her on ramipril, check BMP. Avoid NSAIDs. Hydration to be maintained. Also has history of renal stenosis.  - CMP with eGFR([facility]); Future  11. Annual exam uptodate with immunization. MMSE, labs and medications reviewed. Fall risk reviewed. counselled on exercise, diet, skin care.   12. Anemia of chronic disease Continue methylfolate and iron supplement. Has ckd    Labs/tests ordered:   Lab Orders     CMP with eGFR([facility])     Lipid Panel      CBC (no diff)     TSH     Vitamin D, 1,25-dihydroxy     CMP with eGFR([facility])  Next appointment: 6 months  Communication: reviewed care plan with patient     Blanchie Serve, MD Internal Medicine [facility] [place], [place] [address] Cell Phone (Monday-Friday 8 am - 5 pm): [phone] On Call: [phone] and follow prompts after 5 pm and on weekends Office Phone: [phone] Office Fax: [phone]

## 2018-02-13 ENCOUNTER — Other Ambulatory Visit: Payer: Self-pay | Admitting: Cardiovascular Disease

## 2018-02-14 NOTE — Telephone Encounter (Signed)
Rx has been sent to the pharmacy electronically. ° °

## 2018-02-20 ENCOUNTER — Telehealth: Payer: Self-pay | Admitting: *Deleted

## 2018-02-20 ENCOUNTER — Other Ambulatory Visit: Payer: Self-pay | Admitting: Internal Medicine

## 2018-02-20 ENCOUNTER — Other Ambulatory Visit: Payer: Self-pay

## 2018-02-20 DIAGNOSIS — I10 Essential (primary) hypertension: Secondary | ICD-10-CM

## 2018-02-20 DIAGNOSIS — N183 Chronic kidney disease, stage 3 unspecified: Secondary | ICD-10-CM

## 2018-02-20 DIAGNOSIS — I447 Left bundle-branch block, unspecified: Secondary | ICD-10-CM

## 2018-02-20 DIAGNOSIS — S2241XS Multiple fractures of ribs, right side, sequela: Secondary | ICD-10-CM

## 2018-02-20 DIAGNOSIS — R269 Unspecified abnormalities of gait and mobility: Secondary | ICD-10-CM

## 2018-02-20 DIAGNOSIS — G3184 Mild cognitive impairment, so stated: Secondary | ICD-10-CM

## 2018-02-20 DIAGNOSIS — E7800 Pure hypercholesterolemia, unspecified: Secondary | ICD-10-CM

## 2018-02-20 LAB — TIQ-MISC

## 2018-02-20 NOTE — Telephone Encounter (Signed)
Question about labs drawn this AM at [facility]. Patient had no orders or appt per Linus Orn from [facility]. She said they went ahead and drew a SST for a CMP that was ordered but cancelled last week since she no showed her last lab appt. However, I'm showing that she needed multiple labs drawn based on her chart orders and that the orders were still active. Then I also show a BMP ordered the same day by [place], [place]. I called [facility] and had them cancel the tube that they drew today and rescheduled patient for 4/1. (She needed multiple tubes and only 1 was drawn this AM, so she would need a redraw anyway). I spoke with [place] and she said she reordered the BMP because the patient no showed her previous lab appt. Based on the office note correlating with these labs in question, this was the incorrect test and the order didn't include the other tests that needed to be done. So, I'm just restarting the process to avoid further confusion. I have notified the facility that she will need to come back on 4/1 for a redraw.

## 2018-02-24 ENCOUNTER — Other Ambulatory Visit: Payer: Self-pay

## 2018-02-25 LAB — COMPLETE METABOLIC PANEL WITH GFR
AG RATIO: 2.1 (calc) (ref 1.0–2.5)
ALT: 15 U/L (ref 6–29)
AST: 17 U/L (ref 10–35)
Albumin: 4.2 g/dL (ref 3.6–5.1)
Alkaline phosphatase (APISO): 89 U/L (ref 33–130)
BILIRUBIN TOTAL: 0.3 mg/dL (ref 0.2–1.2)
BUN/Creatinine Ratio: 18 (calc) (ref 6–22)
BUN: 35 mg/dL — AB (ref 7–25)
CHLORIDE: 111 mmol/L — AB (ref 98–110)
CO2: 22 mmol/L (ref 20–32)
Calcium: 9.1 mg/dL (ref 8.6–10.4)
Creat: 1.96 mg/dL — ABNORMAL HIGH (ref 0.60–0.88)
GFR, Est African American: 25 mL/min/{1.73_m2} — ABNORMAL LOW (ref >=60)
GFR, Est Non African American: 22 mL/min/{1.73_m2} — ABNORMAL LOW (ref >=60)
GLUCOSE: 141 mg/dL — AB (ref 65–99)
Globulin: 2 g/dL (calc) (ref 1.9–3.7)
POTASSIUM: 5.5 mmol/L — AB (ref 3.5–5.3)
Sodium: 141 mmol/L (ref 135–146)
TOTAL PROTEIN: 6.2 g/dL (ref 6.1–8.1)

## 2018-02-25 LAB — LIPID PANEL
Cholesterol: 151 mg/dL (ref <200)
HDL: 69 mg/dL (ref >50)
LDL CHOLESTEROL (CALC): 65 mg/dL
Non-HDL Cholesterol (Calc): 82 mg/dL (calc) (ref <130)
TRIGLYCERIDES: 89 mg/dL (ref <150)
Total CHOL/HDL Ratio: 2.2 (calc) (ref <5.0)

## 2018-02-25 LAB — CBC
HEMATOCRIT: 32.1 % — AB (ref 35.0–45.0)
Hemoglobin: 10.5 g/dL — ABNORMAL LOW (ref 11.7–15.5)
MCH: 31.3 pg (ref 27.0–33.0)
MCHC: 32.7 g/dL (ref 32.0–36.0)
MCV: 95.5 fL (ref 80.0–100.0)
MPV: 10.1 fL (ref 7.5–12.5)
Platelets: 315 10*3/uL (ref 140–400)
RBC: 3.36 10*6/uL — AB (ref 3.80–5.10)
RDW: 11.8 % (ref 11.0–15.0)
WBC: 8.2 10*3/uL (ref 3.8–10.8)

## 2018-02-25 LAB — TSH: TSH: 2.65 mIU/L (ref 0.40–4.50)

## 2018-02-25 LAB — VITAMIN D 25 HYDROXY (VIT D DEFICIENCY, FRACTURES): Vit D, 25-Hydroxy: 21 ng/mL — ABNORMAL LOW (ref 30–100)

## 2018-03-05 ENCOUNTER — Non-Acute Institutional Stay: Payer: Medicare Other | Admitting: Internal Medicine

## 2018-03-05 ENCOUNTER — Encounter: Payer: Self-pay | Admitting: Internal Medicine

## 2018-03-05 VITALS — BP 122/64 | HR 64 | Temp 97.7°F | Resp 16 | Ht 62.0 in | Wt 137.6 lb

## 2018-03-05 DIAGNOSIS — R739 Hyperglycemia, unspecified: Secondary | ICD-10-CM | POA: Diagnosis not present

## 2018-03-05 DIAGNOSIS — N184 Chronic kidney disease, stage 4 (severe): Secondary | ICD-10-CM

## 2018-03-05 DIAGNOSIS — E875 Hyperkalemia: Secondary | ICD-10-CM

## 2018-03-05 DIAGNOSIS — D638 Anemia in other chronic diseases classified elsewhere: Secondary | ICD-10-CM | POA: Diagnosis not present

## 2018-03-05 NOTE — Patient Instructions (Signed)
Stop taking ramipril.  Check your blood pressure once a day daily for 4 weeks. If more than 3 readings are greater than 140/90, please let my office know.  If you develop any headache, chest pain or trouble with your breathing notify us.   We need to push fluids to keep you hydrated.

## 2018-03-05 NOTE — Progress Notes (Signed)
[facility]  Provider: Blanchie Serve MD   Location:  [place] of Service:  Clinic (12)  PCP: Blanchie Serve, MD Patient Care Team: Blanchie Serve, MD as PCP - General (Internal Medicine) Lorretta Harp, MD as Consulting Physician (Cardiology) Aquilla Hacker, MD as Referring Physician (Psychiatry) Irene Shipper, MD as Consulting Physician (Gastroenterology) Newton Pigg, MD as Consulting Physician (Obstetrics and Gynecology) Mast, Man X, NP as Nurse Practitioner (Internal Medicine)  Extended Emergency Contact Information Primary Emergency Contact: Hobbs,Diane Address: [address]          [place], [place] [address] [name] of [place] Phone: [phone] Mobile Phone: [phone] Relation: Daughter  Goals of Care: Advanced Directive information Advanced Directives 02/12/2018  Does Patient Have a Medical Advance Directive? Yes  Type of Paramedic of [place];Living will  Does patient want to make changes to medical advance directive? No - Patient declined  Copy of [place] in Chart? Yes  Would patient like information on creating a medical advance directive? -      Chief Complaint  Patient presents with  . Acute Visit    Follow up to discuss her abnormal labs.     HPI: Patient is a 84 y.o. female seen today for acute visit. She had blood work done on 02/24/18 with several abnormal lab values. Here to review these. Her renal function has worsened per lab result. Pt here with caregiver. She is not good with drinking water. She has been having 1-2 bowel movement a day with loose stool, no blood or mucus reported. Metamucil helping her. Normal lipid panel. Currently on statin   Past Medical History:  Diagnosis Date  . Anxiety and depression   . Aortic valve disease   . BBB (bundle branch block)    left, chronic  . Bipolar 1 disorder ([facility])   . CKD (chronic kidney  disease), stage III ([place])   . GERD (gastroesophageal reflux disease)   . Herpes zoster   . Hiatal hernia   . Hyperlipidemia   . Hypertension   . Hypertensive CKD (chronic kidney disease)   . Hypothyroid   . Osteoarthritis   . Osteopenia   . Renal artery stenosis (HCC)    left   Past Surgical History:  Procedure Laterality Date  . CARDIAC CATHETERIZATION  03/21/04   essentially normal coronary arteries with moderate decrease in left ventricular function, 80% ostial left renal artery stenosis  . COLONOSCOPY  11/11/2006   Dr. Scarlette Shorts , diverticulosis, colon polyps  . [place]  . PANENDOSCOPY  11/11/2006   Dr. Scarlette Shorts, esophageal stricture s/p balloon dilation, GERD, Zenker's Diverticulum    reports that she has never smoked. She has never used smokeless tobacco. She reports that she does not drink alcohol or use drugs. Social History   Socioeconomic History  . Marital status: Widowed    Spouse name: Not on file  . Number of children: 1  . Years of education: Not on file  . Highest education level: Not on file  Occupational History  . Occupation: retired Education administrator  . Financial resource strain: Not hard at all  . Food insecurity:    Worry: Never true    Inability: Never true  . Transportation needs:    Medical: No    Non-medical: No  Tobacco Use  . Smoking status: Never Smoker  . Smokeless tobacco: Never Used  Substance and Sexual Activity  . Alcohol use: No  . Drug use: No  . Sexual activity: Never  Lifestyle  . Physical activity:    Days per week: 2 days    Minutes per session: 30 min  . Stress: Only a little  Relationships  . Social connections:    Talks on phone: More than three times a week    Gets together: More than three times a week    Attends religious service: More than 4 times per year    Active member of club or organization: No    Attends meetings of clubs or organizations: Never    Relationship status: Widowed    . Intimate partner violence:    Fear of current or ex partner: No    Emotionally abused: No    Physically abused: No    Forced sexual activity: No  Other Topics Concern  . Not on file  Social History Narrative   [facility] at [facility]   Widowed   Never smoked   Alcohol none   Exercise 3 times a week   POA      Diet?       Do you drink/eat things with caffeine?  Yes      Marital status? married                                   What year were you married? 1950      Do you live in a house, apartment, assisted living, condo, trailer, etc.? [place]      Is it one or more stories? Yes (elevators)      How many persons live in your home? 2      Do you have any pets in your home? (please list) none      Current or past profession: Network engineer      Do you exercise?     yes                                 Type & how often? 3x week   Walks with walker      Do you have a living will? yes      Do you have a DNR form?   yes                               If not, do you want to discuss one?      Do you have signed POA/HPOA for forms?      Family History  Problem Relation Age of Onset  . Stroke Father   . Stroke Sister 33  . Diabetes Brother   . Appendicitis Mother   . Diabetes Brother   . Heart disease Brother   . Colon cancer Neg Hx   . Liver cancer Neg Hx     Health Maintenance  Topic Date Due  . DEXA SCAN  10/04/1990  . INFLUENZA VACCINE  06/26/2018  . TETANUS/TDAP  08/03/2018  . PNA vac Low Risk Adult  Completed    No Known Allergies  Outpatient Encounter Medications as of 03/05/2018  Medication Sig  . aspirin EC 81 MG tablet Take 81 mg by mouth daily.  Marland Kitchen buPROPion (WELLBUTRIN XL) 150 MG 24 hr tablet Take 450 mg by mouth  daily.  . DULoxetine (CYMBALTA) 60 MG capsule Take 60 mg by mouth at bedtime.   . ferrous sulfate (SLOW IRON) 160 (50 Fe) MG TBCR SR tablet Take 1 tablet (160 mg total) by mouth daily.  Marland Kitchen L-Methylfolate (DEPLIN) 7.5 MG TABS Take  1 tablet by mouth daily.   Marland Kitchen levothyroxine (SYNTHROID, LEVOTHROID) 50 MCG tablet TAKE ONE TABLET BY MOUTH EVERY MORNING ON AN EMPTY STOMACH FOR THYROID  . lithium carbonate 150 MG capsule Take 150 mg by mouth daily.  Marland Kitchen loperamide (IMODIUM A-D) 2 MG tablet 1/2 to 1 tablet daily as needed to control diarrhea if you have 3 or more loose stool.  . metoprolol succinate (TOPROL-XL) 25 MG 24 hr tablet TAKE ONE TABLET BY MOUTH DAILY  . paliperidone (INVEGA) 3 MG 24 hr tablet Take 3 mg by mouth daily.  . ramipril (ALTACE) 10 MG capsule TAKE ONE CAPSULE BY MOUTH DAILY  . simvastatin (ZOCOR) 10 MG tablet Take 10 mg by mouth daily.   No facility-administered encounter medications on file as of 03/05/2018.     Review of Systems  Constitutional: Negative for appetite change, chills, fatigue and fever.  Respiratory: Negative for shortness of breath.   Cardiovascular: Negative for chest pain, palpitations and leg swelling.  Gastrointestinal: Negative for abdominal pain, blood in stool, nausea and vomiting.  Genitourinary: Negative for dysuria and hematuria.  Musculoskeletal: Positive for gait problem.  Neurological: Negative for dizziness and headaches.  Psychiatric/Behavioral: Positive for confusion.    Vitals:   03/05/18 1006  BP: 122/64  Pulse: 64  Resp: 16  Temp: 97.7 F (36.5 C)  TempSrc: Oral  SpO2: 98%  Weight: 137 lb 9.6 oz (62.4 kg)  Height: '5\' 2"'  (1.575 m)   Body mass index is 25.17 kg/m.   Wt Readings from Last 3 Encounters:  03/05/18 137 lb 9.6 oz (62.4 kg)  02/12/18 141 lb 3.2 oz (64 kg)  01/17/18 140 lb (63.5 kg)   Physical Exam  Constitutional: She is oriented to person, place, and time. She appears well-developed and well-nourished. No distress.  HENT:  Head: Normocephalic and atraumatic.  Nose: Nose normal.  Mouth/Throat: Oropharynx is clear and moist. No oropharyngeal exudate.  Eyes: Conjunctivae are normal.  Neck: Neck supple.  Cardiovascular: Normal rate and  regular rhythm.  Pulmonary/Chest: Effort normal and breath sounds normal.  Abdominal: Soft. Bowel sounds are normal. There is no tenderness.  Musculoskeletal: She exhibits no edema.  Lymphadenopathy:    She has no cervical adenopathy.  Neurological: She is alert and oriented to person, place, and time.  Skin: Skin is warm and dry. She is not diaphoretic.  Psychiatric: She has a normal mood and affect.    Labs reviewed: Basic Metabolic Panel: Recent Labs    07/31/17 0000 11/04/17 1000 02/24/18 0000  NA 140 139 141  K 5.2 4.8 5.5*  CL 112* 107 111*  CO2 '21 25 22  ' GLUCOSE 102* 189* 141*  BUN 27* 30* 35*  CREATININE 1.66* 1.77* 1.96*  CALCIUM 9.1 9.5 9.1   Liver Function Tests: Recent Labs    02/24/18 0000  AST 17  ALT 15  BILITOT 0.3  PROT 6.2   No results for input(s): LIPASE, AMYLASE in the last 8760 hours. No results for input(s): AMMONIA in the last 8760 hours. CBC: Recent Labs    04/26/17 0750 07/31/17 0000 11/04/17 1000 02/24/18 0000  WBC 6.5 6.6 8.7 8.2  NEUTROABS 4,680 5,069  --   --   HGB 9.9* 9.9*  10.5* 10.5*  HCT 31.4* 30.4* 32.2* 32.1*  MCV 99.4 96.5 96.4 95.5  PLT 283 254 304 315   Cardiac Enzymes: No results for input(s): CKTOTAL, CKMB, CKMBINDEX, TROPONINI in the last 8760 hours. BNP: Invalid input(s): POCBNP Lab Results  Component Value Date   HGBA1C 5.3 01/31/2018   Lab Results  Component Value Date   TSH 2.65 02/24/2018   Lab Results  Component Value Date   VITAMINB12 283 12/26/2016   Lab Results  Component Value Date   FOLATE >24.0 12/26/2016   Lab Results  Component Value Date   IRON 64 12/26/2016   TIBC 304 12/26/2016   FERRITIN 49 11/04/2017    Lipid Panel: Recent Labs    04/26/17 0750 11/04/17 1000 02/24/18 0000  CHOL 147 147 151  HDL 77 71 69  LDLCALC 55 55 65  TRIG 74 125 89  CHOLHDL 1.9 2.1 2.2   Lab Results  Component Value Date   HGBA1C 5.3 01/31/2018    Procedures since last visit: No results  found.  Assessment/Plan  1. CKD (chronic kidney disease) stage 4, GFR 15-29 ml/min (HCC) Worsening renal function. D/c ramipril and monitor BP daily for 4 weeks. Adjust antihypertensive if needed - BMP with eGFR([facility]); Future  2. Hyperkalemia Likely with ckd. Asymptomatic. With loose stool, check bmp to rule out low k.  - BMP with eGFR([facility]); Future  3. Hyperglycemia a1c 5.3. Monitor. Cut down on sweets if possible - BMP with eGFR([facility]); Future  4. Anemia of chronic disease Low but stable h&h, continue iron supplement    Labs/tests ordered:   Lab Orders     BMP with eGFR([facility])   Next appointment: 4-6 weeks for BP and renal function  Communication: reviewed care plan with patient and her caregiver    Blanchie Serve, MD Internal Medicine [place], [place] [address] Cell Phone (Monday-Friday 8 am - 5 pm): [phone] On Call: [phone] and follow prompts after 5 pm and on weekends Office Phone: [phone] Office Fax: [phone]

## 2018-03-06 ENCOUNTER — Telehealth: Payer: Self-pay

## 2018-03-06 ENCOUNTER — Ambulatory Visit: Payer: Medicare Other | Admitting: Nurse Practitioner

## 2018-03-06 NOTE — Telephone Encounter (Signed)
Received a call from the patient's daughter stating that she didn't know anything about her mother's appointment with Sherrie Mustache for possible pneumonia. She mentioned that her mother was fine and that there was no need for her to be seen. She stated that all future appointments must go thru her due to the fact that her mother will not check her messages or communicate with anyone. I have changed the primary number to the daughter's number that way she can make sure that her mother's aid can get there in time. Patient's daughter was very happy that we addressed this concern so quickly.

## 2018-03-13 ENCOUNTER — Ambulatory Visit: Payer: Medicare Other

## 2018-03-13 LAB — BASIC METABOLIC PANEL WITH GFR
BUN/Creatinine Ratio: 20 (calc) (ref 6–22)
BUN: 40 mg/dL — AB (ref 7–25)
CO2: 20 mmol/L (ref 20–32)
CREATININE: 1.98 mg/dL — AB (ref 0.60–0.88)
Calcium: 9.3 mg/dL (ref 8.6–10.4)
Chloride: 111 mmol/L — ABNORMAL HIGH (ref 98–110)
GFR, EST AFRICAN AMERICAN: 25 mL/min/{1.73_m2} — AB (ref >=60)
GFR, Est Non African American: 21 mL/min/{1.73_m2} — ABNORMAL LOW (ref >=60)
Glucose, Bld: 104 mg/dL — ABNORMAL HIGH (ref 65–99)
Potassium: 4.1 mmol/L (ref 3.5–5.3)
Sodium: 140 mmol/L (ref 135–146)

## 2018-03-24 ENCOUNTER — Telehealth: Payer: Self-pay | Admitting: *Deleted

## 2018-03-24 NOTE — Telephone Encounter (Signed)
Daiva Eves, RN at [facility], called to say that family and staff had a meeting about patient. They are requesting ST, PT and OT. ST for kitchen safety, PT/OT due to decrease in ADL's. Please confirm/write order.

## 2018-03-24 NOTE — Telephone Encounter (Signed)
Script with PT, OT and SLP consult provided.

## 2018-04-23 ENCOUNTER — Encounter: Payer: Medicare Other | Admitting: Internal Medicine

## 2018-05-16 ENCOUNTER — Other Ambulatory Visit: Payer: Self-pay | Admitting: Internal Medicine

## 2018-05-27 IMAGING — CT CT ABD-PELV W/O CM
2 of 4 series · 16 of 46 positions shown, 18 images · non-contrast
Comparison: 09/02/2008

CLINICAL DATA: Generalized weakness starting last night with
diarrhea starting this morning.

EXAM:
CT ABDOMEN AND PELVIS WITHOUT CONTRAST
TECHNIQUE: Multidetector CT imaging of the abdomen and pelvis was performed
following the standard protocol without IV contrast.

[Series 2: abd/pel w/o · axial · non-contrast · 0.80mm/px · z∈[+849,+1199]mm · 13 of 80 slices shown, 15 images]
[im 5/80  soft-tissue]
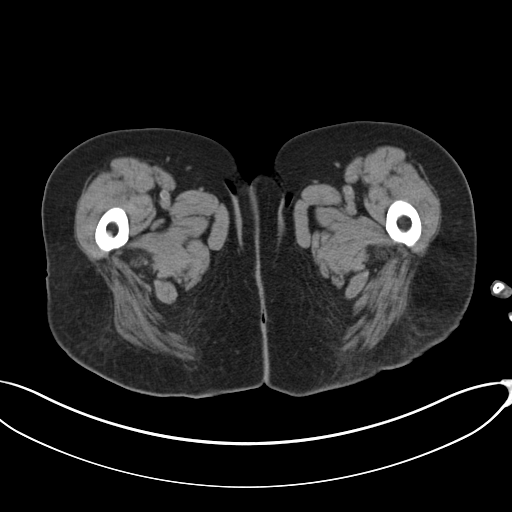
[im 5/80  bone]
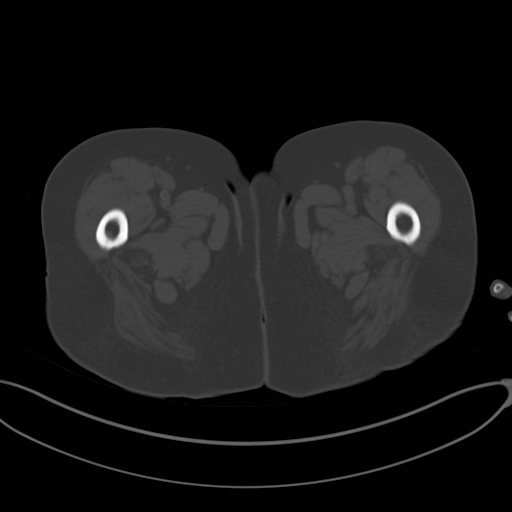
[im 13/80  soft-tissue]
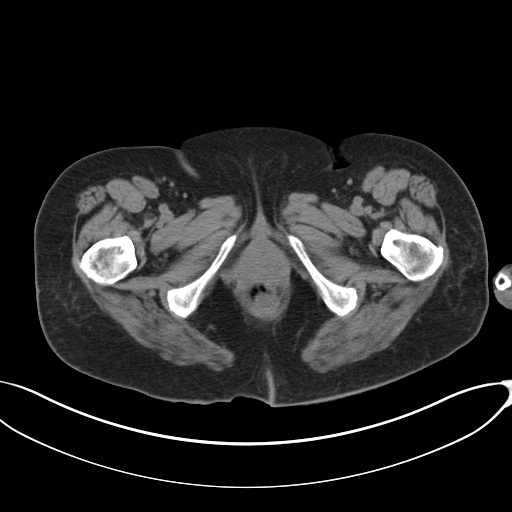
[im 17/80  soft-tissue]
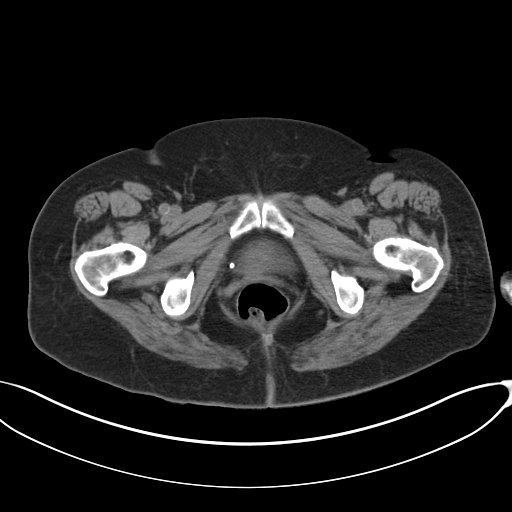
[im 21/80  soft-tissue]
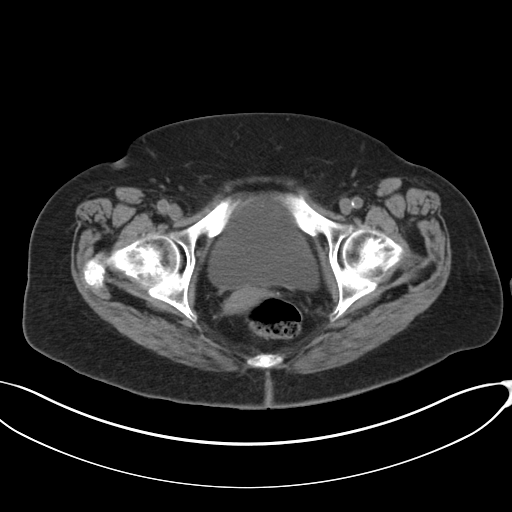
[im 30/80  soft-tissue]
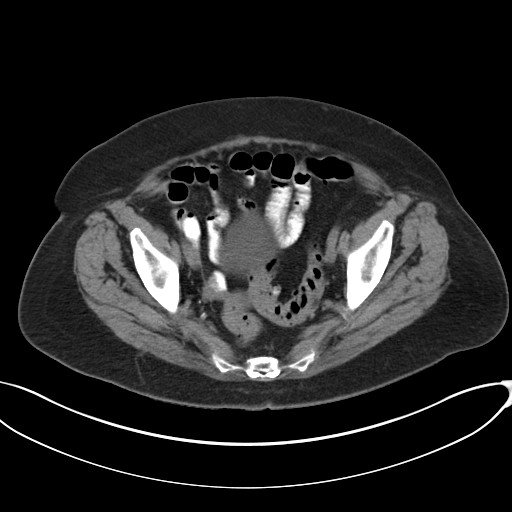
[im 34/80  soft-tissue]
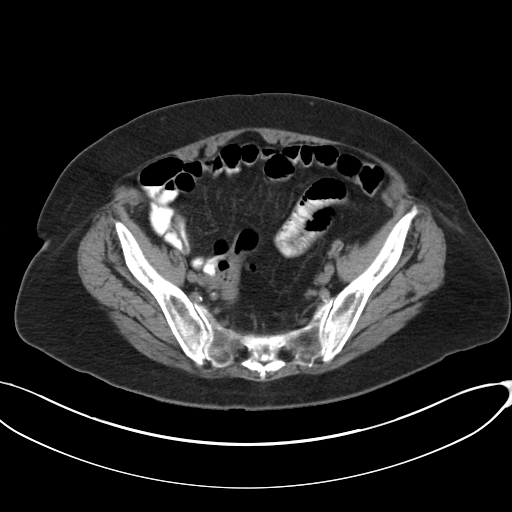
[im 42/80  soft-tissue]
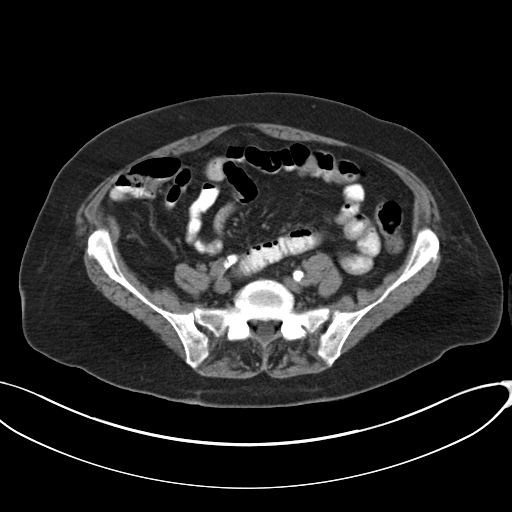
[im 46/80  soft-tissue]
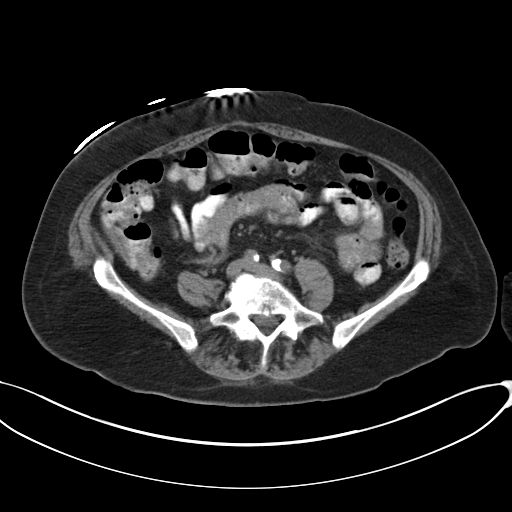
[im 50/80  soft-tissue]
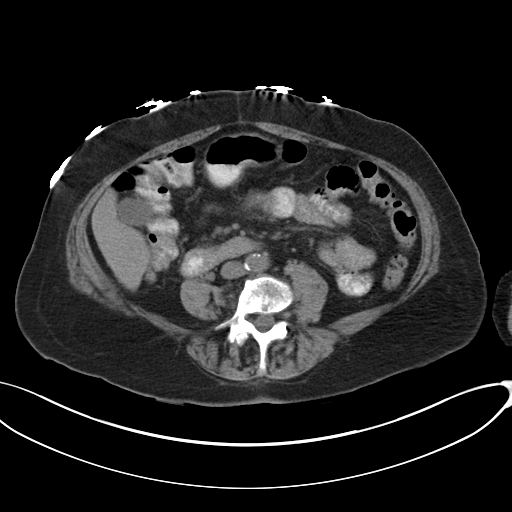
[im 50/80  bone]
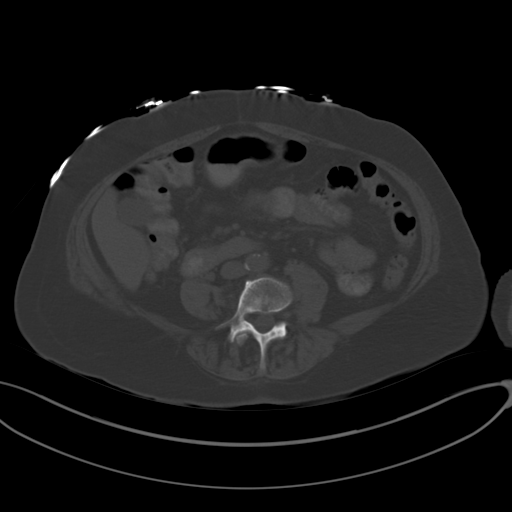
[im 59/80  soft-tissue]
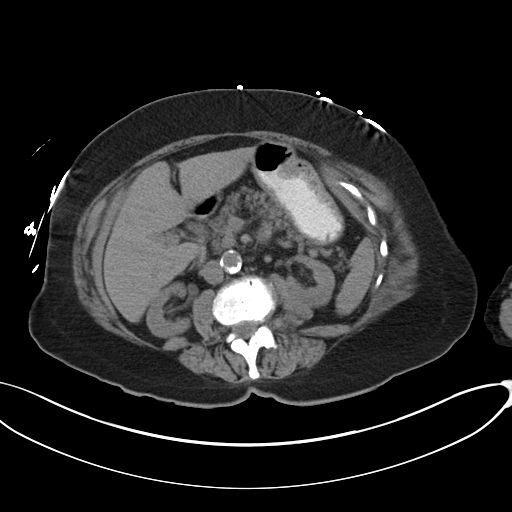
[im 63/80  soft-tissue]
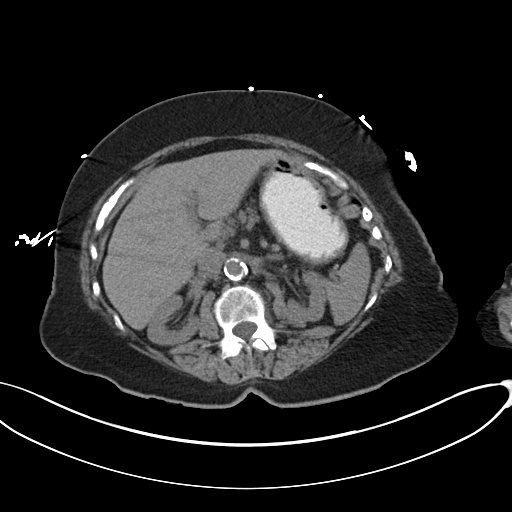
[im 67/80  soft-tissue]
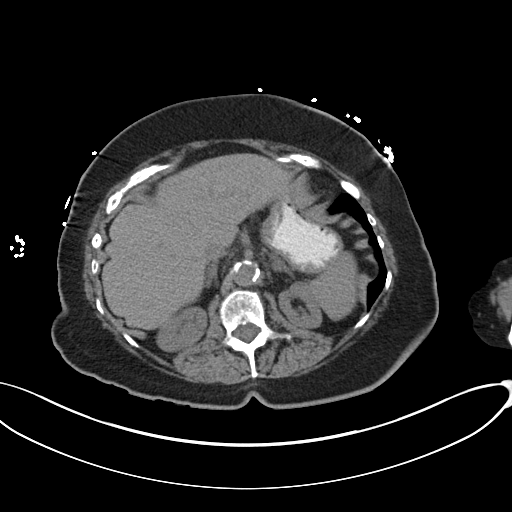
[im 75/80  soft-tissue]
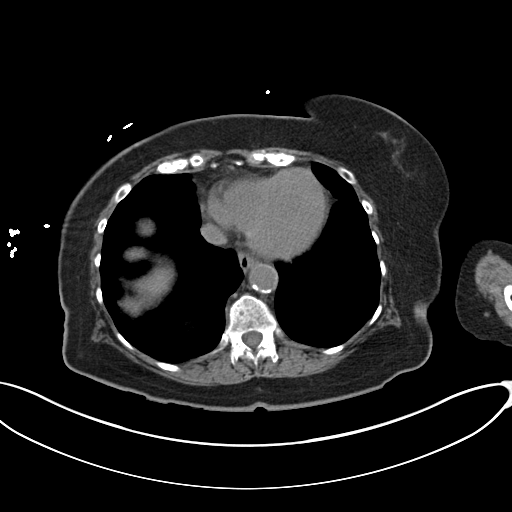

[Series 3: coronal · coronal · 0.81mm/px · 3 of 130 slices shown]
[im 44/130  soft-tissue]
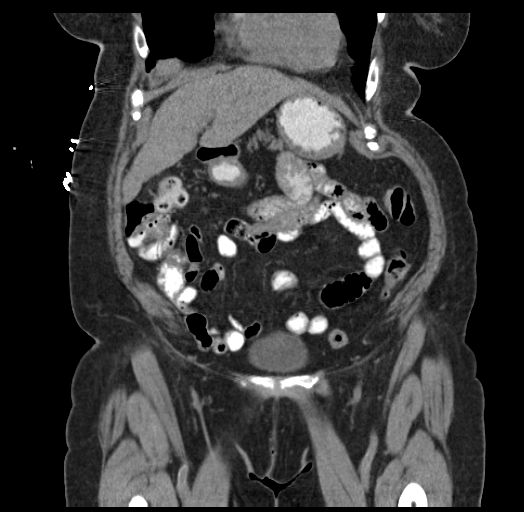
[im 58/130  soft-tissue]
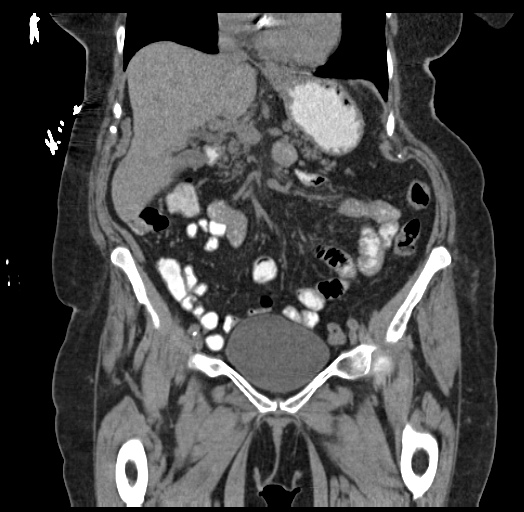
[im 72/130  soft-tissue]
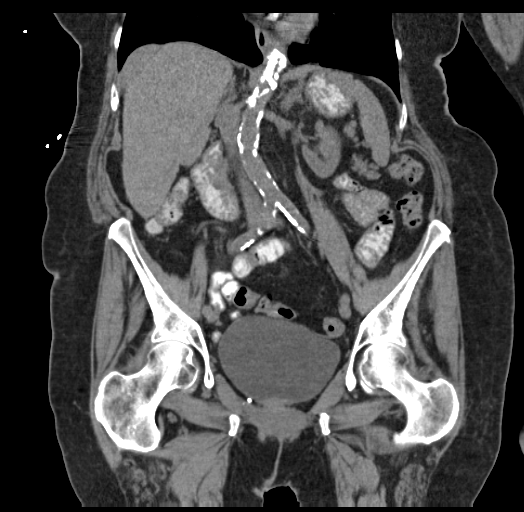

[16 of 46 positions shown; findings below may reference images not displayed]

FINDINGS: Lower chest:  Scattered areas of small airway impaction noted.

Hepatobiliary: No focal abnormality in the liver on this study
without intravenous contrast. There is no evidence for gallstones,
gallbladder wall thickening, or pericholecystic fluid. No
intrahepatic or extrahepatic biliary dilation.

Pancreas: No focal mass lesion. No dilatation of the main duct. No
intraparenchymal cyst. No peripancreatic edema.

Spleen: No splenomegaly. No focal mass lesion.

Adrenals/Urinary Tract: No adrenal nodule or mass. Kidneys are
unremarkable on this noncontrast exam. No evidence for hydroureter.
Bladder is mildly distended.

Stomach/Bowel: Small hiatal hernia. Stomach otherwise unremarkable.
Duodenum is normally positioned as is the ligament of Treitz. No
small bowel wall thickening. No small bowel dilatation. The terminal
ileum is normal. The appendix is not visualized, but there is no
edema or inflammation in the region of the cecum. Diverticular
changes are noted in the left colon without evidence of
diverticulitis.

Vascular/Lymphatic: There is abdominal aortic atherosclerosis
without aneurysm. There is no gastrohepatic or hepatoduodenal
ligament lymphadenopathy. No intraperitoneal or retroperitoneal
lymphadenopathy. No pelvic sidewall lymphadenopathy.

Reproductive: The uterus has normal CT imaging appearance. There is
no adnexal mass.

Other: No intraperitoneal free fluid.

Musculoskeletal: Bone windows reveal no worrisome lytic or sclerotic
osseous lesions.
IMPRESSION: 1. No acute findings in the abdomen or pelvis. Specifically, no
findings to explain the patient's history weakness and diarrhea.
There is no evidence for diverticulitis. No pericolonic edema or
colonic wall thickening to suggest colitis.

## 2018-06-04 ENCOUNTER — Encounter: Payer: Self-pay | Admitting: Internal Medicine

## 2018-06-04 ENCOUNTER — Non-Acute Institutional Stay: Payer: Medicare Other | Admitting: Internal Medicine

## 2018-06-04 VITALS — BP 130/70 | HR 66 | Temp 97.9°F | Resp 18 | Ht 62.0 in | Wt 133.2 lb

## 2018-06-04 DIAGNOSIS — G3184 Mild cognitive impairment, so stated: Secondary | ICD-10-CM

## 2018-06-04 DIAGNOSIS — M25472 Effusion, left ankle: Secondary | ICD-10-CM

## 2018-06-04 DIAGNOSIS — R1310 Dysphagia, unspecified: Secondary | ICD-10-CM

## 2018-06-04 DIAGNOSIS — M25471 Effusion, right ankle: Secondary | ICD-10-CM | POA: Diagnosis not present

## 2018-06-04 DIAGNOSIS — Z9189 Other specified personal risk factors, not elsewhere classified: Secondary | ICD-10-CM | POA: Diagnosis not present

## 2018-06-04 DIAGNOSIS — K219 Gastro-esophageal reflux disease without esophagitis: Secondary | ICD-10-CM

## 2018-06-04 MED ORDER — MEDICAL COMPRESSION STOCKINGS MISC: 0 refills | Status: DC

## 2018-06-04 MED ORDER — OMEPRAZOLE 20 MG PO CPDR: 20.0000 mg | DELAYED_RELEASE_CAPSULE | Freq: Every day | ORAL | 3 refills | Status: AC

## 2018-06-04 NOTE — Patient Instructions (Signed)
Start taking omeprazole once a day to help with reflux/ heartburn symptom.  Continue working with speech therapy for your swallowing.   Get a pair of compression stocking from medical supply store. Apply it daily in the morning and remove it at bedtime to help with leg swelling. Keep your legs elevated when possible to help subside the swelling.

## 2018-06-04 NOTE — Progress Notes (Signed)
[facility]  Provider: Blanchie Serve MD   Location:  [place] of Service:  Clinic (12)  PCP: Blanchie Serve, MD Patient Care Team: Blanchie Serve, MD as PCP - General (Internal Medicine) Lorretta Harp, MD as Consulting Physician (Cardiology) Aquilla Hacker, MD as Referring Physician (Psychiatry) Irene Shipper, MD as Consulting Physician (Gastroenterology) Newton Pigg, MD as Consulting Physician (Obstetrics and Gynecology) Mast, Man X, NP as Nurse Practitioner (Internal Medicine)  Extended Emergency Contact Information Primary Emergency Contact: Hobbs,Diane Address: [address]          [place],  [address] [name] of [place] Phone: [phone] Mobile Phone: [phone] Relation: Daughter   Goals of Care: Advanced Directive information Advanced Directives 06/04/2018  Does Patient Have a Medical Advance Directive? Yes  Type of Advance Directive [place]  Does patient want to make changes to medical advance directive? No - Patient declined  Copy of [place] in Chart? Yes  Would patient like information on creating a medical advance directive? -     Chief Complaint  Patient presents with  . Acute Visit    Swollen ankles for 1 week. Patient noticed her ankles were swollen when she was getting ready for bed. Patient denies hitting her ankles on anything.     HPI: Patient is a 84 y.o. female seen today for acute visit for swollen ankle.she complaints of swelling to her ankle mainly towards end of day. Her caregiver is present with her. This has been going on for few weeks.  It starts around noon and increases by end of day. Denies any pain with swelling. Has some discomfort. Swelling does not limit her mobility. Denies any drainage from legs. Denies wound or rash. She has mild cognitive impairment. She was seen by SLP with dysphagia with dementia. I had provided a verbal  order for FEES to rule out aspiration. She has undergone FEES on 06/02/18 with wet quality voice mainly during meals.   Past Medical History:  Diagnosis Date  . Anxiety and depression   . Aortic valve disease   . BBB (bundle branch block)    left, chronic  . Bipolar 1 disorder ([place])   . CKD (chronic kidney disease), stage III ([place])   . GERD (gastroesophageal reflux disease)   . Herpes zoster   . Hiatal hernia   . Hyperlipidemia   . Hypertension   . Hypertensive CKD (chronic kidney disease)   . Hypothyroid   . Osteoarthritis   . Osteopenia   . Renal artery stenosis (HCC)    left   Past Surgical History:  Procedure Laterality Date  . CARDIAC CATHETERIZATION  03/21/04   essentially normal coronary arteries with moderate decrease in left ventricular function, 80% ostial left renal artery stenosis  . COLONOSCOPY  11/11/2006   Dr. Scarlette Shorts , diverticulosis, colon polyps  . [place]  . PANENDOSCOPY  11/11/2006   Dr. Scarlette Shorts, esophageal stricture s/p balloon dilation, GERD, Zenker's Diverticulum    reports that she has never smoked. She has never used smokeless tobacco. She reports that she does not drink alcohol or use drugs. Social History   Socioeconomic History  . Marital status: Widowed    Spouse name: Not on file  . Number of children: 1  . Years of education: Not on file  . Highest education level: Not on file  Occupational History  . Occupation: retired Network engineer  Social Needs  . Financial resource strain: Not hard at all  . Food insecurity:    Worry: Never true    Inability: Never true  . Transportation needs:    Medical: No    Non-medical: No  Tobacco Use  . Smoking status: Never Smoker  . Smokeless tobacco: Never Used  Substance and Sexual Activity  . Alcohol use: No  . Drug use: No  . Sexual activity: Never  Lifestyle  . Physical activity:    Days per week: 2 days    Minutes per session: 30 min  . Stress: Only a little    Relationships  . Social connections:    Talks on phone: More than three times a week    Gets together: More than three times a week    Attends religious service: More than 4 times per year    Active member of club or organization: No    Attends meetings of clubs or organizations: Never    Relationship status: Widowed  . Intimate partner violence:    Fear of current or ex partner: No    Emotionally abused: No    Physically abused: No    Forced sexual activity: No  Other Topics Concern  . Not on file  Social History Narrative   [facility] at [facility]   Widowed   Never smoked   Alcohol none   Exercise 3 times a week   POA      Diet?       Do you drink/eat things with caffeine?  Yes      Marital status? married                                   What year were you married? 1950      Do you live in a house, apartment, assisted living, condo, trailer, etc.? [place]      Is it one or more stories? Yes (elevators)      How many persons live in your home? 2      Do you have any pets in your home? (please list) none      Current or past profession: Network engineer      Do you exercise?     yes                                 Type & how often? 3x week   Walks with walker      Do you have a living will? yes      Do you have a DNR form?   yes                               If not, do you want to discuss one?      Do you have signed POA/HPOA for forms?      Family History  Problem Relation Age of Onset  . Stroke Father   . Stroke Sister 54  . Diabetes Brother   . Appendicitis Mother   . Diabetes Brother   . Heart disease Brother   . Colon cancer Neg Hx   . Liver cancer Neg Hx     Health Maintenance  Topic Date Due  . DEXA SCAN  10/04/1990  . INFLUENZA VACCINE  06/26/2018  .  TETANUS/TDAP  08/03/2018  . PNA vac Low Risk Adult  Completed    No Known Allergies  Outpatient Encounter Medications as of 06/04/2018  Medication Sig  . aspirin EC 81 MG tablet  Take 81 mg by mouth daily.  Marland Kitchen buPROPion (WELLBUTRIN XL) 150 MG 24 hr tablet Take 450 mg by mouth daily.  . DULoxetine (CYMBALTA) 60 MG capsule Take 60 mg by mouth at bedtime.   . ferrous sulfate (SLOW IRON) 160 (50 Fe) MG TBCR SR tablet Take 1 tablet (160 mg total) by mouth daily.  Marland Kitchen L-Methylfolate (DEPLIN) 7.5 MG TABS Take 1 tablet by mouth daily.   Marland Kitchen levothyroxine (SYNTHROID, LEVOTHROID) 50 MCG tablet TAKE ONE TABLET BY MOUTH EVERY MORNING ON AN EMPTY STOMACH FOR THYROID  . lithium carbonate 150 MG capsule Take 150 mg by mouth daily.  Marland Kitchen loperamide (IMODIUM A-D) 2 MG tablet 1/2 to 1 tablet daily as needed to control diarrhea if you have 3 or more loose stool.  . metoprolol succinate (TOPROL-XL) 25 MG 24 hr tablet TAKE ONE TABLET BY MOUTH DAILY  . paliperidone (INVEGA) 3 MG 24 hr tablet Take 3 mg by mouth daily.  . simvastatin (ZOCOR) 10 MG tablet Take 10 mg by mouth daily.   No facility-administered encounter medications on file as of 06/04/2018.     Review of Systems  Constitutional: Negative for appetite change, chills, fatigue and fever.  HENT: Positive for hearing loss and trouble swallowing. Negative for congestion and mouth sores.   Respiratory: Negative for chest tightness, shortness of breath and wheezing.        Cough with meals, gurgling sound with meals  Cardiovascular: Negative for chest pain and palpitations.  Gastrointestinal: Negative for abdominal pain, nausea and vomiting.  Genitourinary: Negative for dysuria.  Musculoskeletal: Positive for gait problem.  Skin: Negative for rash.  Neurological: Negative for dizziness, speech difficulty and headaches.  Psychiatric/Behavioral: Positive for confusion and decreased concentration.    Vitals:   06/04/18 1057  BP: 130/70  Pulse: 66  Resp: 18  Temp: 97.9 F (36.6 C)  TempSrc: Oral  SpO2: 95%  Weight: 133 lb 3.2 oz (60.4 kg)  Height: 5\' 2"  (1.575 m)   Body mass index is 24.36 kg/m.   Wt Readings from Last 3  Encounters:  06/04/18 133 lb 3.2 oz (60.4 kg)  03/05/18 137 lb 9.6 oz (62.4 kg)  02/12/18 141 lb 3.2 oz (64 kg)   Physical Exam  Constitutional: She is oriented to person, place, and time. She appears well-developed and well-nourished. No distress.  HENT:  Head: Normocephalic and atraumatic.  Mouth/Throat: No oropharyngeal exudate.  Eyes: Conjunctivae are normal. Right eye exhibits no discharge. Left eye exhibits no discharge.  Neck: Neck supple.  Cardiovascular: Normal rate and regular rhythm.  Pulmonary/Chest: Effort normal and breath sounds normal. No respiratory distress. She has no wheezes. She has no rales.  Abdominal: Soft. Bowel sounds are normal. There is no tenderness. There is no guarding.  Musculoskeletal: Normal range of motion. She exhibits no edema or tenderness.  Uses walker, unsteady gait  Neurological: She is alert and oriented to person, place, and time.  Skin: Skin is warm and dry. Capillary refill takes less than 2 seconds. She is not diaphoretic.  Psychiatric: She has a normal mood and affect. Her behavior is normal.    Labs reviewed: Basic Metabolic Panel: Recent Labs    11/04/17 1000 02/24/18 0000 03/13/18 0740  NA 139 141 140  K 4.8 5.5* 4.1  CL  107 111* 111*  CO2 25 22 20   GLUCOSE 189* 141* 104*  BUN 30* 35* 40*  CREATININE 1.77* 1.96* 1.98*  CALCIUM 9.5 9.1 9.3   Liver Function Tests: Recent Labs    02/24/18 0000  AST 17  ALT 15  BILITOT 0.3  PROT 6.2   No results for input(s): LIPASE, AMYLASE in the last 8760 hours. No results for input(s): AMMONIA in the last 8760 hours. CBC: Recent Labs    07/31/17 0000 11/04/17 1000 02/24/18 0000  WBC 6.6 8.7 8.2  NEUTROABS 5,069  --   --   HGB 9.9* 10.5* 10.5*  HCT 30.4* 32.2* 32.1*  MCV 96.5 96.4 95.5  PLT 254 304 315   Cardiac Enzymes: No results for input(s): CKTOTAL, CKMB, CKMBINDEX, TROPONINI in the last 8760 hours. BNP: Invalid input(s): POCBNP Lab Results  Component Value Date     HGBA1C 5.3 01/31/2018   Lab Results  Component Value Date   TSH 2.65 02/24/2018   Lab Results  Component Value Date   VITAMINB12 283 12/26/2016   Lab Results  Component Value Date   FOLATE >24.0 12/26/2016   Lab Results  Component Value Date   IRON 64 12/26/2016   TIBC 304 12/26/2016   FERRITIN 49 11/04/2017    Lipid Panel: Recent Labs    11/04/17 1000 02/24/18 0000  CHOL 147 151  HDL 71 69  LDLCALC 55 65  TRIG 125 89  CHOLHDL 2.1 2.2   Lab Results  Component Value Date   HGBA1C 5.3 01/31/2018    Procedures since last visit: No results found.  Assessment/Plan  1. At high risk for aspiration Aspiration precautions, small bite  2. Mild cognitive impairment Supportive care  3. Gastroesophageal reflux disease without esophagitis - omeprazole (PRILOSEC) 20 MG capsule; Take 1 capsule (20 mg total) by mouth daily.  Dispense: 30 capsule; Refill: 3  4. Ankle edema, bilateral Keep legs elevated at rest, compression stockings.  - Elastic Bandages & Supports (MEDICAL COMPRESSION STOCKINGS) MISC; Apply compression stockings to both legs in morning and remove it at bedtime for leg edema  Dispense: 2 each; Refill: 0  5. Dysphagia, unspecified type S/p FEES. Recommendation is for regular diet and thin liquids. Aspiration/ reflux precautions. Continue SLP. Reflux rx as above.     Labs/tests ordered:  none  Next appointment: has f/u with labs prior  Communication: reviewed care plan with patient and her caregiver    Blanchie Serve, MD Internal Medicine [place], [place] [address] Cell Phone (Monday-Friday 8 am - 5 pm): [phone] On Call: [phone] and follow prompts after 5 pm and on weekends Office Phone: [phone] Office Fax: [phone]

## 2018-06-14 ENCOUNTER — Encounter (HOSPITAL_COMMUNITY): Payer: Self-pay

## 2018-06-14 ENCOUNTER — Emergency Department (HOSPITAL_COMMUNITY): Payer: Medicare Other

## 2018-06-14 ENCOUNTER — Emergency Department (HOSPITAL_COMMUNITY)
Admission: EM | Admit: 2018-06-14 | Discharge: 2018-06-14 | Disposition: A | Discharge status: Home / Self Care | Payer: Medicare Other | Attending: Emergency Medicine | Admitting: Emergency Medicine

## 2018-06-14 ENCOUNTER — Other Ambulatory Visit: Payer: Self-pay

## 2018-06-14 DIAGNOSIS — Z79899 Other long term (current) drug therapy: Secondary | ICD-10-CM | POA: Insufficient documentation

## 2018-06-14 DIAGNOSIS — Z7982 Long term (current) use of aspirin: Secondary | ICD-10-CM | POA: Insufficient documentation

## 2018-06-14 DIAGNOSIS — N183 Chronic kidney disease, stage 3 (moderate): Secondary | ICD-10-CM | POA: Diagnosis not present

## 2018-06-14 DIAGNOSIS — E039 Hypothyroidism, unspecified: Secondary | ICD-10-CM | POA: Diagnosis not present

## 2018-06-14 DIAGNOSIS — S0990XA Unspecified injury of head, initial encounter: Secondary | ICD-10-CM

## 2018-06-14 DIAGNOSIS — S098XXA Other specified injuries of head, initial encounter: Secondary | ICD-10-CM | POA: Insufficient documentation

## 2018-06-14 DIAGNOSIS — Y999 Unspecified external cause status: Secondary | ICD-10-CM | POA: Insufficient documentation

## 2018-06-14 DIAGNOSIS — Y9289 Other specified places as the place of occurrence of the external cause: Secondary | ICD-10-CM | POA: Insufficient documentation

## 2018-06-14 DIAGNOSIS — I129 Hypertensive chronic kidney disease with stage 1 through stage 4 chronic kidney disease, or unspecified chronic kidney disease: Secondary | ICD-10-CM | POA: Insufficient documentation

## 2018-06-14 DIAGNOSIS — Y939 Activity, unspecified: Secondary | ICD-10-CM | POA: Insufficient documentation

## 2018-06-14 DIAGNOSIS — W19XXXA Unspecified fall, initial encounter: Secondary | ICD-10-CM | POA: Insufficient documentation

## 2018-06-14 NOTE — ED Notes (Signed)
[facility]AR has been called  

## 2018-06-14 NOTE — ED Provider Notes (Signed)
[facility] DEPT Provider Note   CSN: [id number] Arrival date & time: 06/14/18  1108     History   Chief Complaint Chief Complaint  Patient presents with  . Fall    HPI Lynn Hayes is a 84 y.o. female.  84 year old female with past medical history including hypertension, hyperlipidemia, CKD, bipolar disorder who presents with fall and head injury.  Earlier this morning, the patient was out to eat with friends when she dropped some Kleenex and bent over to pick it up.  She lost her balance and fell over, bumping her forehead.  She did not lose consciousness and denies any other areas of injury aside from her forehead.  Her pain is minimal.  She denies any neck, back, chest, or pelvis pain.  No anticoagulant use.  She denies any vomiting or confusion since the injury.  She got back to her nursing facility and they called EMS to have her evaluated.  The history is provided by the patient.  Fall     Past Medical History:  Diagnosis Date  . Anxiety and depression   . Aortic valve disease   . BBB (bundle branch block)    left, chronic  . Bipolar 1 disorder ([place])   . CKD (chronic kidney disease), stage III ([place])   . GERD (gastroesophageal reflux disease)   . Herpes zoster   . Hiatal hernia   . Hyperlipidemia   . Hypertension   . Hypertensive CKD (chronic kidney disease)   . Hypothyroid   . Osteoarthritis   . Osteopenia   . Renal artery stenosis [facility])    left    Patient Active Problem List   Diagnosis Date Noted  . Dysphagia 06/04/2018  . Gastroesophageal reflux disease without esophagitis 06/04/2018  . Ankle edema, bilateral 06/04/2018  . Multiple closed fractures of ribs of right side 02/12/2018  . Mild cognitive impairment 02/12/2018  . Memory loss 02/21/2017  . Anemia, chronic disease 12/25/2016  . Weak 12/25/2016  . Abnormality of gait 12/25/2016  . Aortic valve disorder 11/07/2016  . Bipolar disorder ([place]) 11/07/2016  . CKD  (chronic kidney disease) stage 3, GFR 30-59 ml/min (HCC) 11/07/2016  . Hypothyroidism 11/07/2016  . Chest pain 04/05/2014  . Left bundle branch block 05/28/2013  . Essential hypertension 05/28/2013  . Hyperlipidemia 05/28/2013  . Renal artery stenosis ([place]) 05/28/2013  . Carotid bruit 05/28/2013    Past Surgical History:  Procedure Laterality Date  . CARDIAC CATHETERIZATION  03/21/04   essentially normal coronary arteries with moderate decrease in left ventricular function, 80% ostial left renal artery stenosis  . COLONOSCOPY  11/11/2006   Dr. Scarlette Shorts , diverticulosis, colon polyps  . [place]  . PANENDOSCOPY  11/11/2006   Dr. Scarlette Shorts, esophageal stricture s/p balloon dilation, GERD, Zenker's Diverticulum     OB History   None      Home Medications    Prior to Admission medications   Medication Sig Start Date End Date Taking? Authorizing Provider  aspirin EC 81 MG tablet Take 81 mg by mouth daily.    [provider]  buPROPion (WELLBUTRIN XL) 150 MG 24 hr tablet Take 450 mg by mouth daily.    [provider]  DULoxetine (CYMBALTA) 60 MG capsule Take 60 mg by mouth at bedtime.     [provider]  Elastic Bandages & Supports (MEDICAL COMPRESSION STOCKINGS) MISC Apply compression stockings to both legs in morning and remove it at bedtime for  leg edema 06/04/18   Blanchie Serve, MD  ferrous sulfate (SLOW IRON) 160 (50 Fe) MG TBCR SR tablet Take 1 tablet (160 mg total) by mouth daily. 11/06/17   Blanchie Serve, MD  L-Methylfolate (DEPLIN) 7.5 MG TABS Take 1 tablet by mouth daily.     [provider]  levothyroxine (SYNTHROID, LEVOTHROID) 50 MCG tablet TAKE ONE TABLET BY MOUTH EVERY MORNING ON AN EMPTY STOMACH FOR THYROID 05/16/18   Blanchie Serve, MD  lithium carbonate 150 MG capsule Take 150 mg by mouth daily.    [provider]  loperamide (IMODIUM A-D) 2 MG tablet 1/2 to 1 tablet daily as needed to control diarrhea  if you have 3 or more loose stool. 08/07/17   Blanchie Serve, MD  metoprolol succinate (TOPROL-XL) 25 MG 24 hr tablet TAKE ONE TABLET BY MOUTH DAILY 02/14/18   Lorretta Harp, MD  omeprazole (PRILOSEC) 20 MG capsule Take 1 capsule (20 mg total) by mouth daily. 06/04/18   Blanchie Serve, MD  paliperidone (INVEGA) 3 MG 24 hr tablet Take 3 mg by mouth daily.    [provider]  simvastatin (ZOCOR) 10 MG tablet Take 10 mg by mouth daily.    [provider]    Family History Family History  Problem Relation Age of Onset  . Stroke Father   . Stroke Sister 39  . Diabetes Brother   . Appendicitis Mother   . Diabetes Brother   . Heart disease Brother   . Colon cancer Neg Hx   . Liver cancer Neg Hx     Social History Social History   Tobacco Use  . Smoking status: Never Smoker  . Smokeless tobacco: Never Used  Substance Use Topics  . Alcohol use: No  . Drug use: No     Allergies   Patient has no known allergies.   Review of Systems Review of Systems All other systems reviewed and are negative except that which was mentioned in HPI  Physical Exam Updated Vital Signs BP (!) 158/49 (BP Location: Left Arm)   Pulse 67   Temp 98.5 F (36.9 C) (Oral)   Resp 18   Ht 5\' 2"  (1.575 m)   Wt 59 kg (130 lb)   SpO2 95%   BMI 23.78 kg/m   Physical Exam  Constitutional: She is oriented to person, place, and time. She appears well-developed and well-nourished. No distress.  HENT:  Head: Normocephalic.  Moist mucous membranes Small hematoma and abrasion on central forehead  Eyes: Pupils are equal, round, and reactive to light. Conjunctivae are normal.  Neck: Neck supple.  No midline spinal tenderness  Cardiovascular: Normal rate, regular rhythm and normal heart sounds.  No murmur heard. Pulmonary/Chest: Effort normal and breath sounds normal.  Abdominal: Soft. Bowel sounds are normal. She exhibits no distension. There is no tenderness.  Musculoskeletal: Normal  range of motion. She exhibits no edema or tenderness.  Neurological: She is alert and oriented to person, place, and time. No sensory deficit.  Fluent speech 5/5 strength and normal sensation x all 4 ext  Skin: Skin is warm and dry.  Psychiatric: She has a normal mood and affect. Judgment normal.  Nursing note and vitals reviewed.    ED Treatments / Results  Labs (all labs ordered are listed, but only abnormal results are displayed) Labs Reviewed - No data to display  EKG None  Radiology Ct Head Wo Contrast  Result Date: 06/14/2018 CLINICAL DATA:  Fall this morning. EXAM: CT HEAD  WITHOUT CONTRAST TECHNIQUE: Contiguous axial images were obtained from the base of the skull through the vertex without intravenous contrast. COMPARISON:  06/29/2016 FINDINGS: Brain: Ventricles, cisterns and other CSF spaces are normal. There is mild chronic ischemic microvascular disease. There is no mass, mass effect, shift of midline structures or acute hemorrhage. No evidence of acute infarction. Vascular: No hyperdense vessel or unexpected calcification. Skull: Normal. Negative for fracture or focal lesion. Sinuses/Orbits: No acute finding. Other: None. IMPRESSION: No acute findings. Mild chronic ischemic microvascular disease. Electronically Signed   By: Marin Olp M.D.   On: 06/14/2018 12:36    Procedures Procedures (including critical care time)  Medications Ordered in ED Medications - No data to display   Initial Impression / Assessment and Plan / ED Course  I have reviewed the triage vital signs and the nursing notes.  Pertinent  imaging results that were available during my care of the patient were reviewed by me and considered in my medical decision making (see chart for details).     Patient well-appearing and comfortable on exam, reassuring vital signs, normal neurologic exam.  She denied any complaints.  Head CT negative for intracranial injury.  Discussed supportive measures and  extensively reviewed return precautions.  She voiced understanding.  Final Clinical Impressions(s) / ED Diagnoses   Final diagnoses:  None    ED Discharge Orders    None       Wilbert Hayashi, Wenda Overland, MD 06/14/18 1303

## 2018-06-14 NOTE — ED Notes (Signed)
ED Provider at bedside. 

## 2018-06-14 NOTE — ED Notes (Signed)
Bed: [facility] Expected date: 06/14/18 Expected time: 11:00 AM Means of arrival: Ambulance Comments: Fall no injury

## 2018-06-14 NOTE — ED Triage Notes (Signed)
Pt comes from [facility]. Pt is AOx4 and ambulatory with device. Facility called 911 due to patient having fall. Pt went out to eat with friends this morning and fell. Pt Dropped Klenex and was bending over to pick up and fell over. Pt came back to facility. Then facility called to get patient checked out and cleared. Pt has no other complaints.   Small hematoma above left eyebrow. Denies any pain.

## 2018-07-09 ENCOUNTER — Encounter: Payer: Self-pay | Admitting: Internal Medicine

## 2018-07-11 ENCOUNTER — Other Ambulatory Visit: Payer: Self-pay

## 2018-07-11 ENCOUNTER — Emergency Department (HOSPITAL_COMMUNITY): Payer: Medicare Other

## 2018-07-11 ENCOUNTER — Encounter (HOSPITAL_COMMUNITY): Payer: Self-pay

## 2018-07-11 ENCOUNTER — Emergency Department (HOSPITAL_COMMUNITY)
Admission: EM | Admit: 2018-07-11 | Discharge: 2018-07-12 | Disposition: A | Discharge status: Skilled Nursing Facility | Payer: Medicare Other | Attending: Emergency Medicine | Admitting: Emergency Medicine

## 2018-07-11 ENCOUNTER — Telehealth: Payer: Self-pay | Admitting: *Deleted

## 2018-07-11 DIAGNOSIS — R21 Rash and other nonspecific skin eruption: Secondary | ICD-10-CM | POA: Insufficient documentation

## 2018-07-11 DIAGNOSIS — S0993XA Unspecified injury of face, initial encounter: Secondary | ICD-10-CM | POA: Diagnosis present

## 2018-07-11 DIAGNOSIS — Z7982 Long term (current) use of aspirin: Secondary | ICD-10-CM | POA: Diagnosis not present

## 2018-07-11 DIAGNOSIS — Y939 Activity, unspecified: Secondary | ICD-10-CM | POA: Insufficient documentation

## 2018-07-11 DIAGNOSIS — Y92129 Unspecified place in nursing home as the place of occurrence of the external cause: Secondary | ICD-10-CM | POA: Diagnosis not present

## 2018-07-11 DIAGNOSIS — N183 Chronic kidney disease, stage 3 (moderate): Secondary | ICD-10-CM | POA: Diagnosis not present

## 2018-07-11 DIAGNOSIS — R296 Repeated falls: Secondary | ICD-10-CM | POA: Diagnosis not present

## 2018-07-11 DIAGNOSIS — S0511XA Contusion of eyeball and orbital tissues, right eye, initial encounter: Secondary | ICD-10-CM | POA: Diagnosis not present

## 2018-07-11 DIAGNOSIS — E039 Hypothyroidism, unspecified: Secondary | ICD-10-CM | POA: Insufficient documentation

## 2018-07-11 DIAGNOSIS — W19XXXA Unspecified fall, initial encounter: Secondary | ICD-10-CM

## 2018-07-11 DIAGNOSIS — Z79899 Other long term (current) drug therapy: Secondary | ICD-10-CM | POA: Diagnosis not present

## 2018-07-11 DIAGNOSIS — H05231 Hemorrhage of right orbit: Secondary | ICD-10-CM

## 2018-07-11 DIAGNOSIS — I129 Hypertensive chronic kidney disease with stage 1 through stage 4 chronic kidney disease, or unspecified chronic kidney disease: Secondary | ICD-10-CM | POA: Diagnosis not present

## 2018-07-11 DIAGNOSIS — W010XXA Fall on same level from slipping, tripping and stumbling without subsequent striking against object, initial encounter: Secondary | ICD-10-CM | POA: Diagnosis not present

## 2018-07-11 DIAGNOSIS — Y999 Unspecified external cause status: Secondary | ICD-10-CM | POA: Diagnosis not present

## 2018-07-11 LAB — I-STAT CHEM 8, ED
BUN: 26 mg/dL — ABNORMAL HIGH (ref 8–23)
CHLORIDE: 108 mmol/L (ref 98–111)
Calcium, Ion: 1.28 mmol/L (ref 1.15–1.40)
Creatinine, Ser: 1.8 mg/dL — ABNORMAL HIGH (ref 0.44–1.00)
GLUCOSE: 95 mg/dL (ref 70–99)
HEMATOCRIT: 34 % — AB (ref 36.0–46.0)
HEMOGLOBIN: 11.6 g/dL — AB (ref 12.0–15.0)
POTASSIUM: 4.3 mmol/L (ref 3.5–5.1)
SODIUM: 142 mmol/L (ref 135–145)
TCO2: 22 mmol/L (ref 22–32)

## 2018-07-11 NOTE — Telephone Encounter (Signed)
Crystal notified to have PT resume. Scheduled appt for 07/16/18 at 8:30 with patient's daughter. She will inform patient. Crystal aware of same.

## 2018-07-11 NOTE — ED Notes (Signed)
Bed: [facility] Expected date:  Expected time:  Means of arrival:  Comments: 63F fall from SNF- No complaints

## 2018-07-11 NOTE — ED Notes (Signed)
Pt daughter advises that this is the patients 3rd fall in the last 2 days. She is concerned about this increase in falls and thinks that the patient needs to be observed overnight. Pt daughter made aware that patient is getting scanned and a disposition will be set after all results are obtained. This RN offered to update daughter with results tonight, she declined and states that she will call in the morning.

## 2018-07-11 NOTE — ED Notes (Signed)
Pt daughter [phone]

## 2018-07-11 NOTE — Telephone Encounter (Signed)
Per Crystal, [facility] Liaison-Daughter requested to re-start PT. Patient finished up 2 weeks ago and has fallen twice since then. Does she need to be re-evaluated in the clinic or can an order just be written to restart it?

## 2018-07-11 NOTE — Discharge Instructions (Addendum)
Place ice to right eye as needed, Tylenol Motrin for pain as needed.

## 2018-07-11 NOTE — ED Triage Notes (Signed)
Pt brought by [facility] from [facility] due to an unwitnessed fall around 2:00 pm this afternoon. Pt stayed on the ground until 8:00 pm. Pt denies hitting her head or LOC. Pt currently complains of swelling and redness to the right eye due to moving her head on the carpet. Pt ambulates with a walker.

## 2018-07-11 NOTE — ED Provider Notes (Signed)
[facility] DEPT Provider Note   CSN: [id number] Arrival date & time: 07/11/18  2040     History   Chief Complaint Chief Complaint  Patient presents with  . Fall    HPI Lynn Hayes is a 84 y.o. female.  Patient with mechanical fall earlier this afternoon.  She typically needs assistance with walking and was unable to get up on her own.  She was on the floor for about 4 to 5 hours.  She was found at her facility and sent for evaluation.  She has some redness over the right side of her face that she states is from lying on the rug.  Denies any loss of consciousness.  Patient does not have any pain.  The history is provided by the patient.  Fall  This is a new problem. The current episode started 6 to 12 hours ago. The problem occurs constantly. The problem has been resolved. Pertinent negatives include no chest pain, no abdominal pain, no headaches and no shortness of breath. Nothing aggravates the symptoms. Nothing relieves the symptoms. She has tried nothing for the symptoms. The treatment provided no relief.    Past Medical History:  Diagnosis Date  . Anxiety and depression   . Aortic valve disease   . BBB (bundle branch block)    left, chronic  . Bipolar 1 disorder ([place])   . CKD (chronic kidney disease), stage III ([place])   . GERD (gastroesophageal reflux disease)   . Herpes zoster   . Hiatal hernia   . Hyperlipidemia   . Hypertension   . Hypertensive CKD (chronic kidney disease)   . Hypothyroid   . Osteoarthritis   . Osteopenia   . Renal artery stenosis [facility])    left    Patient Active Problem List   Diagnosis Date Noted  . Dysphagia 06/04/2018  . Gastroesophageal reflux disease without esophagitis 06/04/2018  . Ankle edema, bilateral 06/04/2018  . Multiple closed fractures of ribs of right side 02/12/2018  . Mild cognitive impairment 02/12/2018  . Memory loss 02/21/2017  . Anemia, chronic disease 12/25/2016  . Weak 12/25/2016    . Abnormality of gait 12/25/2016  . Aortic valve disorder 11/07/2016  . Bipolar disorder ([place]) 11/07/2016  . CKD (chronic kidney disease) stage 3, GFR 30-59 ml/min ([facility]) 11/07/2016  . Hypothyroidism 11/07/2016  . Chest pain 04/05/2014  . Left bundle branch block 05/28/2013  . Essential hypertension 05/28/2013  . Hyperlipidemia 05/28/2013  . Renal artery stenosis ([place]) 05/28/2013  . Carotid bruit 05/28/2013    Past Surgical History:  Procedure Laterality Date  . CARDIAC CATHETERIZATION  03/21/04   essentially normal coronary arteries with moderate decrease in left ventricular function, 80% ostial left renal artery stenosis  . COLONOSCOPY  11/11/2006   Dr. Scarlette Shorts , diverticulosis, colon polyps  . [place]  . PANENDOSCOPY  11/11/2006   Dr. Scarlette Shorts, esophageal stricture s/p balloon dilation, GERD, Zenker's Diverticulum     OB History   None      Home Medications    Prior to Admission medications   Medication Sig Start Date End Date Taking? Authorizing Provider  aspirin EC 81 MG tablet Take 81 mg by mouth daily.   Yes [provider]  buPROPion (WELLBUTRIN XL) 150 MG 24 hr tablet Take 450 mg by mouth daily.   Yes [provider]  DULoxetine (CYMBALTA) 60 MG capsule Take 60 mg by mouth at bedtime.    Yes [provider]  Elastic Bandages & Supports (MEDICAL COMPRESSION STOCKINGS) MISC Apply compression stockings to both legs in morning and remove it at bedtime for leg edema 06/04/18  Yes Blanchie Serve, MD  ferrous sulfate (SLOW IRON) 160 (50 Fe) MG TBCR SR tablet Take 1 tablet (160 mg total) by mouth daily. 11/06/17  Yes Blanchie Serve, MD  L-Methylfolate (DEPLIN) 7.5 MG TABS Take 1 tablet by mouth daily.    Yes [provider]  levothyroxine (SYNTHROID, LEVOTHROID) 50 MCG tablet TAKE ONE TABLET BY MOUTH EVERY MORNING ON AN EMPTY STOMACH FOR THYROID 05/16/18  Yes Blanchie Serve, MD  lithium carbonate 150 MG capsule Take 150  mg by mouth daily.   Yes [provider]  loperamide (IMODIUM A-D) 2 MG tablet 1/2 to 1 tablet daily as needed to control diarrhea if you have 3 or more loose stool. 08/07/17  Yes Blanchie Serve, MD  metoprolol succinate (TOPROL-XL) 25 MG 24 hr tablet TAKE ONE TABLET BY MOUTH DAILY 02/14/18  Yes Lorretta Harp, MD  paliperidone (INVEGA) 3 MG 24 hr tablet Take 3 mg by mouth daily.   Yes [provider]  simvastatin (ZOCOR) 10 MG tablet Take 10 mg by mouth daily.   Yes [provider]  omeprazole (PRILOSEC) 20 MG capsule Take 1 capsule (20 mg total) by mouth daily. 06/04/18   Blanchie Serve, MD    Family History Family History  Problem Relation Age of Onset  . Stroke Father   . Stroke Sister 2  . Diabetes Brother   . Appendicitis Mother   . Diabetes Brother   . Heart disease Brother   . Colon cancer Neg Hx   . Liver cancer Neg Hx     Social History Social History   Tobacco Use  . Smoking status: Never Smoker  . Smokeless tobacco: Never Used  Substance Use Topics  . Alcohol use: No  . Drug use: No     Allergies   Patient has no known allergies.   Review of Systems Review of Systems  Constitutional: Negative for chills and fever.  HENT: Negative for ear pain and sore throat.   Eyes: Negative for pain and visual disturbance.  Respiratory: Negative for cough and shortness of breath.   Cardiovascular: Negative for chest pain and palpitations.  Gastrointestinal: Negative for abdominal pain and vomiting.  Genitourinary: Negative for dysuria and hematuria.  Musculoskeletal: Negative for arthralgias and back pain.  Skin: Positive for rash. Negative for color change.  Neurological: Negative for seizures, syncope and headaches.  All other systems reviewed and are negative.    Physical Exam Updated Vital Signs  ED Triage Vitals  Enc Vitals Group     BP 07/11/18 2047 (!) 161/68     Pulse Rate 07/11/18 2047 78     Resp 07/11/18 2047 16     Temp  07/11/18 2047 98.6 F (37 C)     Temp Source 07/11/18 2047 Oral     SpO2 07/11/18 2047 96 %     Weight 07/11/18 2056 130 lb 1.1 oz (59 kg)     Height 07/11/18 2056 5\' 2"  (1.575 m)     Head Circumference --      Peak Flow --      Pain Score 07/11/18 2054 0     Pain Loc --      Pain Edu? --      Excl. in [place]? --     Physical Exam  Constitutional: She appears well-developed and well-nourished. No  distress.  HENT:  Head: Normocephalic and atraumatic.  Mouth/Throat: No oropharyngeal exudate.  Eyes: Pupils are equal, round, and reactive to light. Conjunctivae and EOM are normal.  Neck: Normal range of motion. Neck supple.  Cardiovascular: Normal rate, regular rhythm, normal heart sounds and intact distal pulses.  No murmur heard. Pulmonary/Chest: Effort normal and breath sounds normal. No stridor. No respiratory distress. She has no wheezes.  Abdominal: Soft. There is no tenderness.  Musculoskeletal: Normal range of motion. She exhibits no edema or tenderness.  No midline spinal tenderness  Neurological: She is alert.  Skin: Skin is warm and dry. Capillary refill takes less than 2 seconds. Rash noted.  Psychiatric: She has a normal mood and affect.  Nursing note and vitals reviewed.    ED Treatments / Results  Labs (all labs ordered are listed, but only abnormal results are displayed) Labs Reviewed  I-STAT CHEM 8, ED - Abnormal; Notable for the following components:      Result Value   BUN 26 (*)    Creatinine, Ser 1.80 (*)    Hemoglobin 11.6 (*)    HCT 34.0 (*)    All other components within normal limits    EKG None  Radiology Ct Head Wo Contrast  Result Date: 07/11/2018 CLINICAL DATA:  Unwitnessed fall at 2 p.m. this afternoon. State on the ground until 8 p.m. No loss of consciousness. Swelling and redness to the right eye. EXAM: CT HEAD WITHOUT CONTRAST CT MAXILLOFACIAL WITHOUT CONTRAST CT CERVICAL SPINE WITHOUT CONTRAST TECHNIQUE: Multidetector CT imaging of the  head, cervical spine, and maxillofacial structures were performed using the standard protocol without intravenous contrast. Multiplanar CT image reconstructions of the cervical spine and maxillofacial structures were also generated. COMPARISON:  CT head 06/14/2018. CT head max cervical 06/29/2016 FINDINGS: CT HEAD FINDINGS Brain: Diffuse cerebral atrophy, mild for age. Mild ventricular dilatation consistent with central atrophy. Patchy low-attenuation changes in the deep white matter consistent with small vessel ischemia. No mass-effect or midline shift. No abnormal extra-axial fluid collections. Gray-white matter junctions are distinct. Basal cisterns are not effaced. No acute intracranial hemorrhage. Vascular: Mild intracranial arterial vascular calcifications. Skull: Calvarium appears intact. No acute depressed fractures. Other: Small subcutaneous scalp hematoma over the left anterior frontal region. CT MAXILLOFACIAL FINDINGS Osseous: No fracture or mandibular dislocation. No destructive process. Orbits: Small right periorbital hematoma. No retrobulbar involvement. Globes and extraocular muscles appear intact and symmetrical. Sinuses: Mucosal thickening in the paranasal sinuses. No acute air-fluid levels. Mastoid air cells are clear. Soft tissues: No significant soft tissue swelling or hematoma. CT CERVICAL SPINE FINDINGS Alignment: Normal alignment of the cervical spine and facet joints. C1-2 articulation appears intact. Skull base and vertebrae: Skull base appears intact. No vertebral compression deformities. No focal bone lesion or bone destruction. Bone cortex appears intact. Soft tissues and spinal canal: No prevertebral soft tissue swelling. No abnormal paraspinal soft tissue mass or infiltration. Disc levels: Degenerative changes throughout the cervical spine with narrowed disc spaces and endplate hypertrophic changes most prominent at C5-6, C6-7, and C7-T1 levels. Degenerative changes throughout the facet  joints. Upper chest: Calcification and scarring in the lung apices, likely postinflammatory. Vascular calcifications. Other: None. IMPRESSION: 1. No acute intracranial abnormalities. Chronic atrophy and small vessel ischemic changes. 2. Small right periorbital hematoma. No retrobulbar involvement. No acute displaced orbital or facial fractures. 3. Normal alignment of the cervical spine. Degenerative changes. No acute displaced fractures identified. Electronically Signed   By: Lucienne Capers M.D.   On: 07/11/2018 21:52  Ct Cervical Spine Wo Contrast  Result Date: 07/11/2018 CLINICAL DATA:  Unwitnessed fall at 2 p.m. this afternoon. State on the ground until 8 p.m. No loss of consciousness. Swelling and redness to the right eye. EXAM: CT HEAD WITHOUT CONTRAST CT MAXILLOFACIAL WITHOUT CONTRAST CT CERVICAL SPINE WITHOUT CONTRAST TECHNIQUE: Multidetector CT imaging of the head, cervical spine, and maxillofacial structures were performed using the standard protocol without intravenous contrast. Multiplanar CT image reconstructions of the cervical spine and maxillofacial structures were also generated. COMPARISON:  CT head 06/14/2018. CT head max cervical 06/29/2016 FINDINGS: CT HEAD FINDINGS Brain: Diffuse cerebral atrophy, mild for age. Mild ventricular dilatation consistent with central atrophy. Patchy low-attenuation changes in the deep white matter consistent with small vessel ischemia. No mass-effect or midline shift. No abnormal extra-axial fluid collections. Gray-white matter junctions are distinct. Basal cisterns are not effaced. No acute intracranial hemorrhage. Vascular: Mild intracranial arterial vascular calcifications. Skull: Calvarium appears intact. No acute depressed fractures. Other: Small subcutaneous scalp hematoma over the left anterior frontal region. CT MAXILLOFACIAL FINDINGS Osseous: No fracture or mandibular dislocation. No destructive process. Orbits: Small right periorbital hematoma. No  retrobulbar involvement. Globes and extraocular muscles appear intact and symmetrical. Sinuses: Mucosal thickening in the paranasal sinuses. No acute air-fluid levels. Mastoid air cells are clear. Soft tissues: No significant soft tissue swelling or hematoma. CT CERVICAL SPINE FINDINGS Alignment: Normal alignment of the cervical spine and facet joints. C1-2 articulation appears intact. Skull base and vertebrae: Skull base appears intact. No vertebral compression deformities. No focal bone lesion or bone destruction. Bone cortex appears intact. Soft tissues and spinal canal: No prevertebral soft tissue swelling. No abnormal paraspinal soft tissue mass or infiltration. Disc levels: Degenerative changes throughout the cervical spine with narrowed disc spaces and endplate hypertrophic changes most prominent at C5-6, C6-7, and C7-T1 levels. Degenerative changes throughout the facet joints. Upper chest: Calcification and scarring in the lung apices, likely postinflammatory. Vascular calcifications. Other: None. IMPRESSION: 1. No acute intracranial abnormalities. Chronic atrophy and small vessel ischemic changes. 2. Small right periorbital hematoma. No retrobulbar involvement. No acute displaced orbital or facial fractures. 3. Normal alignment of the cervical spine. Degenerative changes. No acute displaced fractures identified. Electronically Signed   By: Lucienne Capers M.D.   On: 07/11/2018 21:52   Ct Maxillofacial Wo Contrast  Result Date: 07/11/2018 CLINICAL DATA:  Unwitnessed fall at 2 p.m. this afternoon. State on the ground until 8 p.m. No loss of consciousness. Swelling and redness to the right eye. EXAM: CT HEAD WITHOUT CONTRAST CT MAXILLOFACIAL WITHOUT CONTRAST CT CERVICAL SPINE WITHOUT CONTRAST TECHNIQUE: Multidetector CT imaging of the head, cervical spine, and maxillofacial structures were performed using the standard protocol without intravenous contrast. Multiplanar CT image reconstructions of the  cervical spine and maxillofacial structures were also generated. COMPARISON:  CT head 06/14/2018. CT head max cervical 06/29/2016 FINDINGS: CT HEAD FINDINGS Brain: Diffuse cerebral atrophy, mild for age. Mild ventricular dilatation consistent with central atrophy. Patchy low-attenuation changes in the deep white matter consistent with small vessel ischemia. No mass-effect or midline shift. No abnormal extra-axial fluid collections. Gray-white matter junctions are distinct. Basal cisterns are not effaced. No acute intracranial hemorrhage. Vascular: Mild intracranial arterial vascular calcifications. Skull: Calvarium appears intact. No acute depressed fractures. Other: Small subcutaneous scalp hematoma over the left anterior frontal region. CT MAXILLOFACIAL FINDINGS Osseous: No fracture or mandibular dislocation. No destructive process. Orbits: Small right periorbital hematoma. No retrobulbar involvement. Globes and extraocular muscles appear intact and symmetrical. Sinuses: Mucosal thickening in  the paranasal sinuses. No acute air-fluid levels. Mastoid air cells are clear. Soft tissues: No significant soft tissue swelling or hematoma. CT CERVICAL SPINE FINDINGS Alignment: Normal alignment of the cervical spine and facet joints. C1-2 articulation appears intact. Skull base and vertebrae: Skull base appears intact. No vertebral compression deformities. No focal bone lesion or bone destruction. Bone cortex appears intact. Soft tissues and spinal canal: No prevertebral soft tissue swelling. No abnormal paraspinal soft tissue mass or infiltration. Disc levels: Degenerative changes throughout the cervical spine with narrowed disc spaces and endplate hypertrophic changes most prominent at C5-6, C6-7, and C7-T1 levels. Degenerative changes throughout the facet joints. Upper chest: Calcification and scarring in the lung apices, likely postinflammatory. Vascular calcifications. Other: None. IMPRESSION: 1. No acute intracranial  abnormalities. Chronic atrophy and small vessel ischemic changes. 2. Small right periorbital hematoma. No retrobulbar involvement. No acute displaced orbital or facial fractures. 3. Normal alignment of the cervical spine. Degenerative changes. No acute displaced fractures identified. Electronically Signed   By: Lucienne Capers M.D.   On: 07/11/2018 21:52    Procedures Procedures (including critical care time)  Medications Ordered in ED Medications - No data to display   Initial Impression / Assessment and Plan / ED Course  I have reviewed the triage vital signs and the nursing notes.  Pertinent labs & imaging results that were available during my care of the patient were reviewed by me and considered in my medical decision making (see chart for details).     MARTITA BRUMM is a 84 year old female with history of CKD, hypertension, high cholesterol who presents to the ED after mechanical fall.  Patient with normal vitals.  No fever.  Patient states that she tripped and fell and hit the side of her right face.  She denies any extremity pain.  She denies being on any blood thinners.  Patient was unable to get up from the floor for several hours until somebody found her.  Patient overall well-appearing.  Neurologically intact.  Patient has some swelling on the right side of her face.  Head, neck, face CT ordered.  I-STAT Chem-8 ordered.  Imaging shows periorbital hematoma around the right eye however no fractures.  Imaging is overall unremarkable.  Patient has no signs of entrapment of her eye on exam.  Patient with no acute findings on laboratory work.  Hemoglobin, creatinine at baseline.  Patient discharged in from ED in good condition.  Given return precautions.  Final Clinical Impressions(s) / ED Diagnoses   Final diagnoses:  Fall, initial encounter  Periorbital hematoma of right eye    ED Discharge Orders    None       Lennice Sites, DO 07/12/18 0020

## 2018-07-11 NOTE — Telephone Encounter (Signed)
Ok to start physical therapy. Can you add patient to clinic schedule Monday or Wednesday so I can see her.

## 2018-07-12 NOTE — ED Notes (Signed)
[facility] contacted for transport back to nursing home.

## 2018-07-12 NOTE — ED Notes (Signed)
[facility] states that they do not take reports for residents living in their apartments.

## 2018-07-15 NOTE — Telephone Encounter (Addendum)
[facility] spoke with therapy dept and they advised they will need a new written order. They requested for PT, OT and ST. Sent to Dr. Bubba Camp for [place] and to [facility] to write order.

## 2018-07-16 ENCOUNTER — Non-Acute Institutional Stay: Payer: Medicare Other | Admitting: Internal Medicine

## 2018-07-16 ENCOUNTER — Encounter: Payer: Self-pay | Admitting: Internal Medicine

## 2018-07-16 ENCOUNTER — Other Ambulatory Visit: Payer: Self-pay | Admitting: Internal Medicine

## 2018-07-16 ENCOUNTER — Telehealth: Payer: Self-pay

## 2018-07-16 VITALS — BP 124/68 | HR 74 | Temp 98.0°F | Resp 18 | Ht 62.0 in | Wt 133.4 lb

## 2018-07-16 DIAGNOSIS — W19XXXD Unspecified fall, subsequent encounter: Secondary | ICD-10-CM | POA: Diagnosis not present

## 2018-07-16 DIAGNOSIS — G3184 Mild cognitive impairment, so stated: Secondary | ICD-10-CM | POA: Diagnosis not present

## 2018-07-16 DIAGNOSIS — H903 Sensorineural hearing loss, bilateral: Secondary | ICD-10-CM

## 2018-07-16 DIAGNOSIS — I10 Essential (primary) hypertension: Secondary | ICD-10-CM | POA: Diagnosis not present

## 2018-07-16 DIAGNOSIS — I951 Orthostatic hypotension: Secondary | ICD-10-CM

## 2018-07-16 DIAGNOSIS — E034 Atrophy of thyroid (acquired): Secondary | ICD-10-CM

## 2018-07-16 DIAGNOSIS — R269 Unspecified abnormalities of gait and mobility: Secondary | ICD-10-CM

## 2018-07-16 DIAGNOSIS — E7800 Pure hypercholesterolemia, unspecified: Secondary | ICD-10-CM

## 2018-07-16 DIAGNOSIS — F319 Bipolar disorder, unspecified: Secondary | ICD-10-CM

## 2018-07-16 MED ORDER — METOPROLOL SUCCINATE ER 25 MG PO TB24: 12.5000 mg | ORAL_TABLET | Freq: Every day | ORAL | 1 refills | Status: DC

## 2018-07-16 NOTE — Telephone Encounter (Signed)
Audiology only, not ENT, thanks for correcting!

## 2018-07-16 NOTE — Telephone Encounter (Signed)
Referral placed.

## 2018-07-16 NOTE — Telephone Encounter (Signed)
Referral order signed and sent to [facility]

## 2018-07-16 NOTE — Progress Notes (Signed)
Order for ENT referral to [facility] placed

## 2018-07-16 NOTE — Addendum Note (Signed)
Addended by: Logan Bores on: 07/16/2018 03:01 PM   Modules accepted: Orders

## 2018-07-16 NOTE — Patient Instructions (Signed)
  Keep yourself hydrated.  Take new dose of your metoprolol 12.5 mg daily.

## 2018-07-16 NOTE — Addendum Note (Signed)
Addended by: Logan Bores on: 07/16/2018 11:11 AM   Modules accepted: Orders

## 2018-07-16 NOTE — Telephone Encounter (Signed)
A call was received from [facility] requesting a referral for patient to be seen today. Patient showed up at their office and stated that a referral was supposed to be placed for her to be seen. There is no referral in Epic.   [facility] will see patient today but request that a referral is placed ASAP with today's date.    Please place referral if appropriate.

## 2018-07-16 NOTE — Progress Notes (Signed)
Location:  [place] of Service:  Clinic (12) Provider:  Blanchie Serve, MD  Blanchie Serve, MD  Patient Care Team: Blanchie Serve, MD as PCP - General (Internal Medicine) Lorretta Harp, MD as Consulting Physician (Cardiology) Aquilla Hacker, MD as Referring Physician (Psychiatry) Irene Shipper, MD as Consulting Physician (Gastroenterology) Newton Pigg, MD as Consulting Physician (Obstetrics and Gynecology) Mast, Man X, NP as Nurse Practitioner (Internal Medicine)  Extended Emergency Contact Information Primary Emergency Contact: Hobbs,Diane Address: [address]          [place], [place] [address] [name] of [place] Phone: [phone] Mobile Phone: [phone] Relation: Daughter   Goals of care: Advanced Directive information Advanced Directives 07/16/2018  Does Patient Have a Medical Advance Directive? Yes  Type of Advance Directive [place]  Does patient want to make changes to medical advance directive? No - Patient declined  Copy of [place] in Chart? Yes  Would patient like information on creating a medical advance directive? -     Chief Complaint  Patient presents with  . Acute Visit    fall, ED visit  . Medication Refill    No refills needed at this time    HPI:  Pt is a 84 y.o. female seen today for an acute visit for fall. She had 2 falls and one near fall last week. With second fall, she was in floor for atleast 6 hours until daughter had someone check on her. She was taken to ED on 07/11/18 and had CT head and C spine negative for bleed and fracture. No fall since return from ED. She is here with her daughter today. She denies any complaint this visit. Per daughter, some confusion has been noted. She gets care from caregiver few hours everyday of the weak.    Past Medical History:  Diagnosis Date  . Anxiety and depression   . Aortic valve disease   . BBB (bundle branch block)     left, chronic  . Bipolar 1 disorder ([place])   . CKD (chronic kidney disease), stage III ([place])   . GERD (gastroesophageal reflux disease)   . Herpes zoster   . Hiatal hernia   . Hyperlipidemia   . Hypertension   . Hypertensive CKD (chronic kidney disease)   . Hypothyroid   . Osteoarthritis   . Osteopenia   . Renal artery stenosis ([facility])    left   Past Surgical History:  Procedure Laterality Date  . CARDIAC CATHETERIZATION  03/21/04   essentially normal coronary arteries with moderate decrease in left ventricular function, 80% ostial left renal artery stenosis  . COLONOSCOPY  11/11/2006   Dr. Scarlette Shorts , diverticulosis, colon polyps  . [place]  . PANENDOSCOPY  11/11/2006   Dr. Scarlette Shorts, esophageal stricture s/p balloon dilation, GERD, Zenker's Diverticulum    No Known Allergies  Outpatient Encounter Medications as of 07/16/2018  Medication Sig  . aspirin EC 81 MG tablet Take 81 mg by mouth daily.  Marland Kitchen buPROPion (WELLBUTRIN XL) 150 MG 24 hr tablet Take 450 mg by mouth daily.  . DULoxetine (CYMBALTA) 60 MG capsule Take 60 mg by mouth at bedtime.   Water engineer Bandages & Supports (MEDICAL COMPRESSION STOCKINGS) MISC Apply compression stockings to both legs in morning and remove it at bedtime for leg edema  . ferrous sulfate (SLOW IRON) 160 (50 Fe) MG TBCR SR tablet Take 1 tablet (160 mg total) by mouth  daily.  Marland Kitchen L-Methylfolate (DEPLIN) 7.5 MG TABS Take 1 tablet by mouth daily.   Marland Kitchen levothyroxine (SYNTHROID, LEVOTHROID) 50 MCG tablet TAKE ONE TABLET BY MOUTH EVERY MORNING ON AN EMPTY STOMACH FOR THYROID  . lithium carbonate 150 MG capsule Take 150 mg by mouth daily.  Marland Kitchen loperamide (IMODIUM A-D) 2 MG tablet 1/2 to 1 tablet daily as needed to control diarrhea if you have 3 or more loose stool.  . metoprolol succinate (TOPROL-XL) 25 MG 24 hr tablet TAKE ONE TABLET BY MOUTH DAILY  . omeprazole (PRILOSEC) 20 MG capsule Take 1 capsule (20 mg total) by mouth daily.  .  paliperidone (INVEGA) 3 MG 24 hr tablet Take 3 mg by mouth daily.  . simvastatin (ZOCOR) 10 MG tablet Take 10 mg by mouth daily.   No facility-administered encounter medications on file as of 07/16/2018.     Review of Systems  Constitutional: Negative for appetite change, chills, diaphoresis and fever.  HENT: Positive for hearing loss and rhinorrhea. Negative for congestion, ear discharge, ear pain, mouth sores, sinus pressure, sinus pain, sore throat and trouble swallowing.   Eyes: Negative for visual disturbance.  Respiratory: Negative for cough and shortness of breath.   Cardiovascular: Negative for chest pain and palpitations.  Gastrointestinal: Negative for abdominal pain, constipation, diarrhea, nausea and vomiting.  Genitourinary: Positive for frequency. Negative for dysuria.  Musculoskeletal: Positive for arthralgias and gait problem. Negative for back pain.       Chronic left shoulder pain  Neurological: Negative for dizziness, speech difficulty, light-headedness, numbness and headaches.  Hematological: Bruises/bleeds easily.  Psychiatric/Behavioral: Positive for confusion and dysphoric mood. Negative for behavioral problems, hallucinations, sleep disturbance and suicidal ideas.       Followed by psychiatry service for bipolar disorder    Immunization History  Administered Date(s) Administered  . Influenza-Unspecified 08/09/2009, 08/28/2010, 09/11/2011, 09/09/2012, 08/27/2013, 11/26/2013, 09/07/2014, 08/27/2015, 08/08/2016  . Pneumococcal Conjugate-13 06/10/2014  . Pneumococcal Polysaccharide-23 01/16/2010, 08/28/2010, 09/11/2011  . Pneumococcal-Unspecified 08/27/2015  . Td 08/03/2008  . Zoster 01/16/2010, 07/30/2011   Pertinent  Health Maintenance Due  Topic Date Due  . DEXA SCAN  10/04/1990  . INFLUENZA VACCINE  06/26/2018  . PNA vac Low Risk Adult  Completed   Fall Risk  07/16/2018 01/17/2018 11/07/2016 07/10/2016  Falls in the past year? Yes Yes No Yes  Number falls in  past yr: 2 or more 2 or more - 1  Injury with Fall? Yes Yes - Yes  Comment Patient has a bruise to her right forearm and shoulder.  broken ribs - -  Risk Factor Category  High Fall Risk - - -  Risk for fall due to : Impaired balance/gait;History of fall(s) - - -  Follow up Follow up appointment - - -   Functional Status Survey:    Vitals:   07/16/18 0832  BP: 124/68  Pulse: 74  Resp: 18  Temp: 98 F (36.7 C)  TempSrc: Oral  SpO2: 95%  Weight: 133 lb 6.4 oz (60.5 kg)  Height: '5\' 2"'  (1.575 m)   Body mass index is 24.4 kg/m.   Wt Readings from Last 3 Encounters:  07/16/18 133 lb 6.4 oz (60.5 kg)  07/11/18 130 lb 1.1 oz (59 kg)  06/14/18 130 lb (59 kg)   Physical Exam  Constitutional: She appears well-developed and well-nourished. No distress.  HENT:  Head: Normocephalic and atraumatic.  Right Ear: External ear normal.  Left Ear: External ear normal.  Nose: Nose normal.  Mouth/Throat: Oropharynx is clear  and moist. No oropharyngeal exudate.  Eyes: Pupils are equal, round, and reactive to light. Conjunctivae and EOM are normal. Right eye exhibits no discharge. Left eye exhibits no discharge.  Neck: Normal range of motion. Neck supple.  Cardiovascular: Normal rate and regular rhythm.  Pulmonary/Chest: Effort normal and breath sounds normal. No respiratory distress. She has no wheezes. She has no rales.  Abdominal: Soft. Bowel sounds are normal. There is no tenderness. There is no guarding.  Musculoskeletal:  Unsteady gait, limited ROM with both shoulder, kyphosis and scoliosis, uses walker  Lymphadenopathy:    She has no cervical adenopathy.  Neurological: She is alert.  Oriented to self, place and time, poor safety awareness  Skin: Skin is warm and dry. She is not diaphoretic.  bruise to right forearm and lateral arm area, non tender  Psychiatric: She has a normal mood and affect.    Labs reviewed: Recent Labs    11/04/17 1000 02/24/18 0000 03/13/18 0740  07/11/18 2243  NA 139 141 140 142  K 4.8 5.5* 4.1 4.3  CL 107 111* 111* 108  CO2 '25 22 20  ' --   GLUCOSE 189* 141* 104* 95  BUN 30* 35* 40* 26*  CREATININE 1.77* 1.96* 1.98* 1.80*  CALCIUM 9.5 9.1 9.3  --    Recent Labs    02/24/18 0000  AST 17  ALT 15  BILITOT 0.3  PROT 6.2   Recent Labs    07/31/17 0000 11/04/17 1000 02/24/18 0000 07/11/18 2243  WBC 6.6 8.7 8.2  --   NEUTROABS 5,069  --   --   --   HGB 9.9* 10.5* 10.5* 11.6*  HCT 30.4* 32.2* 32.1* 34.0*  MCV 96.5 96.4 95.5  --   PLT 254 304 315  --    Lab Results  Component Value Date   TSH 2.65 02/24/2018   Lab Results  Component Value Date   HGBA1C 5.3 01/31/2018   Lab Results  Component Value Date   CHOL 151 02/24/2018   HDL 69 02/24/2018   LDLCALC 65 02/24/2018   TRIG 89 02/24/2018   CHOLHDL 2.2 02/24/2018    Significant Diagnostic Results in last 30 days:  Ct Head Wo Contrast  Result Date: 07/11/2018 CLINICAL DATA:  Unwitnessed fall at 2 p.m. this afternoon. State on the ground until 8 p.m. No loss of consciousness. Swelling and redness to the right eye. EXAM: CT HEAD WITHOUT CONTRAST CT MAXILLOFACIAL WITHOUT CONTRAST CT CERVICAL SPINE WITHOUT CONTRAST TECHNIQUE: Multidetector CT imaging of the head, cervical spine, and maxillofacial structures were performed using the standard protocol without intravenous contrast. Multiplanar CT image reconstructions of the cervical spine and maxillofacial structures were also generated. COMPARISON:  CT head 06/14/2018. CT head max cervical 06/29/2016 FINDINGS: CT HEAD FINDINGS Brain: Diffuse cerebral atrophy, mild for age. Mild ventricular dilatation consistent with central atrophy. Patchy low-attenuation changes in the deep white matter consistent with small vessel ischemia. No mass-effect or midline shift. No abnormal extra-axial fluid collections. Gray-white matter junctions are distinct. Basal cisterns are not effaced. No acute intracranial hemorrhage. Vascular: Mild  intracranial arterial vascular calcifications. Skull: Calvarium appears intact. No acute depressed fractures. Other: Small subcutaneous scalp hematoma over the left anterior frontal region. CT MAXILLOFACIAL FINDINGS Osseous: No fracture or mandibular dislocation. No destructive process. Orbits: Small right periorbital hematoma. No retrobulbar involvement. Globes and extraocular muscles appear intact and symmetrical. Sinuses: Mucosal thickening in the paranasal sinuses. No acute air-fluid levels. Mastoid air cells are clear. Soft tissues: No significant soft  tissue swelling or hematoma. CT CERVICAL SPINE FINDINGS Alignment: Normal alignment of the cervical spine and facet joints. C1-2 articulation appears intact. Skull base and vertebrae: Skull base appears intact. No vertebral compression deformities. No focal bone lesion or bone destruction. Bone cortex appears intact. Soft tissues and spinal canal: No prevertebral soft tissue swelling. No abnormal paraspinal soft tissue mass or infiltration. Disc levels: Degenerative changes throughout the cervical spine with narrowed disc spaces and endplate hypertrophic changes most prominent at C5-6, C6-7, and C7-T1 levels. Degenerative changes throughout the facet joints. Upper chest: Calcification and scarring in the lung apices, likely postinflammatory. Vascular calcifications. Other: None. IMPRESSION: 1. No acute intracranial abnormalities. Chronic atrophy and small vessel ischemic changes. 2. Small right periorbital hematoma. No retrobulbar involvement. No acute displaced orbital or facial fractures. 3. Normal alignment of the cervical spine. Degenerative changes. No acute displaced fractures identified. Electronically Signed   By: Lucienne Capers M.D.   On: 07/11/2018 21:52   Ct Cervical Spine Wo Contrast  Result Date: 07/11/2018 CLINICAL DATA:  Unwitnessed fall at 2 p.m. this afternoon. State on the ground until 8 p.m. No loss of consciousness. Swelling and redness  to the right eye. EXAM: CT HEAD WITHOUT CONTRAST CT MAXILLOFACIAL WITHOUT CONTRAST CT CERVICAL SPINE WITHOUT CONTRAST TECHNIQUE: Multidetector CT imaging of the head, cervical spine, and maxillofacial structures were performed using the standard protocol without intravenous contrast. Multiplanar CT image reconstructions of the cervical spine and maxillofacial structures were also generated. COMPARISON:  CT head 06/14/2018. CT head max cervical 06/29/2016 FINDINGS: CT HEAD FINDINGS Brain: Diffuse cerebral atrophy, mild for age. Mild ventricular dilatation consistent with central atrophy. Patchy low-attenuation changes in the deep white matter consistent with small vessel ischemia. No mass-effect or midline shift. No abnormal extra-axial fluid collections. Gray-white matter junctions are distinct. Basal cisterns are not effaced. No acute intracranial hemorrhage. Vascular: Mild intracranial arterial vascular calcifications. Skull: Calvarium appears intact. No acute depressed fractures. Other: Small subcutaneous scalp hematoma over the left anterior frontal region. CT MAXILLOFACIAL FINDINGS Osseous: No fracture or mandibular dislocation. No destructive process. Orbits: Small right periorbital hematoma. No retrobulbar involvement. Globes and extraocular muscles appear intact and symmetrical. Sinuses: Mucosal thickening in the paranasal sinuses. No acute air-fluid levels. Mastoid air cells are clear. Soft tissues: No significant soft tissue swelling or hematoma. CT CERVICAL SPINE FINDINGS Alignment: Normal alignment of the cervical spine and facet joints. C1-2 articulation appears intact. Skull base and vertebrae: Skull base appears intact. No vertebral compression deformities. No focal bone lesion or bone destruction. Bone cortex appears intact. Soft tissues and spinal canal: No prevertebral soft tissue swelling. No abnormal paraspinal soft tissue mass or infiltration. Disc levels: Degenerative changes throughout the  cervical spine with narrowed disc spaces and endplate hypertrophic changes most prominent at C5-6, C6-7, and C7-T1 levels. Degenerative changes throughout the facet joints. Upper chest: Calcification and scarring in the lung apices, likely postinflammatory. Vascular calcifications. Other: None. IMPRESSION: 1. No acute intracranial abnormalities. Chronic atrophy and small vessel ischemic changes. 2. Small right periorbital hematoma. No retrobulbar involvement. No acute displaced orbital or facial fractures. 3. Normal alignment of the cervical spine. Degenerative changes. No acute displaced fractures identified. Electronically Signed   By: Lucienne Capers M.D.   On: 07/11/2018 21:52   Ct Maxillofacial Wo Contrast  Result Date: 07/11/2018 CLINICAL DATA:  Unwitnessed fall at 2 p.m. this afternoon. State on the ground until 8 p.m. No loss of consciousness. Swelling and redness to the right eye. EXAM: CT HEAD WITHOUT CONTRAST  CT MAXILLOFACIAL WITHOUT CONTRAST CT CERVICAL SPINE WITHOUT CONTRAST TECHNIQUE: Multidetector CT imaging of the head, cervical spine, and maxillofacial structures were performed using the standard protocol without intravenous contrast. Multiplanar CT image reconstructions of the cervical spine and maxillofacial structures were also generated. COMPARISON:  CT head 06/14/2018. CT head max cervical 06/29/2016 FINDINGS: CT HEAD FINDINGS Brain: Diffuse cerebral atrophy, mild for age. Mild ventricular dilatation consistent with central atrophy. Patchy low-attenuation changes in the deep white matter consistent with small vessel ischemia. No mass-effect or midline shift. No abnormal extra-axial fluid collections. Gray-white matter junctions are distinct. Basal cisterns are not effaced. No acute intracranial hemorrhage. Vascular: Mild intracranial arterial vascular calcifications. Skull: Calvarium appears intact. No acute depressed fractures. Other: Small subcutaneous scalp hematoma over the left  anterior frontal region. CT MAXILLOFACIAL FINDINGS Osseous: No fracture or mandibular dislocation. No destructive process. Orbits: Small right periorbital hematoma. No retrobulbar involvement. Globes and extraocular muscles appear intact and symmetrical. Sinuses: Mucosal thickening in the paranasal sinuses. No acute air-fluid levels. Mastoid air cells are clear. Soft tissues: No significant soft tissue swelling or hematoma. CT CERVICAL SPINE FINDINGS Alignment: Normal alignment of the cervical spine and facet joints. C1-2 articulation appears intact. Skull base and vertebrae: Skull base appears intact. No vertebral compression deformities. No focal bone lesion or bone destruction. Bone cortex appears intact. Soft tissues and spinal canal: No prevertebral soft tissue swelling. No abnormal paraspinal soft tissue mass or infiltration. Disc levels: Degenerative changes throughout the cervical spine with narrowed disc spaces and endplate hypertrophic changes most prominent at C5-6, C6-7, and C7-T1 levels. Degenerative changes throughout the facet joints. Upper chest: Calcification and scarring in the lung apices, likely postinflammatory. Vascular calcifications. Other: None. IMPRESSION: 1. No acute intracranial abnormalities. Chronic atrophy and small vessel ischemic changes. 2. Small right periorbital hematoma. No retrobulbar involvement. No acute displaced orbital or facial fractures. 3. Normal alignment of the cervical spine. Degenerative changes. No acute displaced fractures identified. Electronically Signed   By: Lucienne Capers M.D.   On: 07/11/2018 21:52    Assessment/Plan  1. Essential hypertension orthostasis noted with change in position from lying to standing. Change metoprolol succinate to 12.5 mg daily.  - CMP with eGFR([facility]); Future - Lipid Panel; Future - metoprolol succinate (TOPROL-XL) 25 MG 24 hr tablet; Take 0.5 tablets (12.5 mg total) by mouth daily.  Dispense: 90 tablet; Refill: 1  2.  Fall, subsequent encounter Will have patient work with PT/OT as tolerated to regain strength and restore function.  Fall precautions to be in place. - CMP with eGFR([facility]); Future - CBC with Differential/Platelets; Future - TSH; Future  3. Mild cognitive impairment Supportive care. Infectious workup and to r/o metabolic causes. SLP consult - Lipid Panel; Future - TSH; Future  4. Hypothyroidism due to acquired atrophy of thyroid Continue levothyroxine - TSH; Future  5. Pure hypercholesterolemia Continue simvastatin - Lipid Panel; Future - TSH; Future  6. Bipolar affective disorder, remission status unspecified ([place]) No change to her psychiatry medication, seeing her psychiatrist today, advised to review and consider decreasing dose of some medication if clinically ok, daughter to discuss further during visit.   7. Abnormality of gait Continue to use walker, PT, OT and SLP consult for gait training and safety awareness  8. Orthostatic hypotension Encouraged hydration, reduce dosing of b blocker - metoprolol succinate (TOPROL-XL) 25 MG 24 hr tablet; Take 0.5 tablets (12.5 mg total) by mouth daily.  Dispense: 90 tablet; Refill: 1   Family/ staff Communication: reviewed care plan with  patient and her daughter  Labs/tests ordered:   Lab Orders     CMP with eGFR([facility])     Lipid Panel     CBC with Differential/Platelets     TSH  Follow up in 2 weeks  Blanchie Serve, MD Internal Medicine [facility] [address] [place], [place] [address] Cell Phone (Monday-Friday 8 am - 5 pm): [phone] On Call: [phone] and follow prompts after 5 pm and on weekends Office Phone: [phone] Office Fax: [phone]

## 2018-07-16 NOTE — Telephone Encounter (Addendum)
Dr.Pandey I would like to confirm, patient was seen at [facility] which is an Audiology office today and the referral requested was for Audiology, you placed order for ENT.  1.) Do you want patient to see ENT as well as Audiology? 2.) Please sign pending audiology referral

## 2018-07-17 DIAGNOSIS — G3184 Mild cognitive impairment, so stated: Secondary | ICD-10-CM

## 2018-07-17 DIAGNOSIS — E7800 Pure hypercholesterolemia, unspecified: Secondary | ICD-10-CM

## 2018-07-17 DIAGNOSIS — I10 Essential (primary) hypertension: Secondary | ICD-10-CM

## 2018-07-17 DIAGNOSIS — W19XXXD Unspecified fall, subsequent encounter: Secondary | ICD-10-CM

## 2018-07-17 DIAGNOSIS — E034 Atrophy of thyroid (acquired): Secondary | ICD-10-CM

## 2018-07-18 LAB — LIPID PANEL
CHOL/HDL RATIO: 2.2 (calc) (ref <5.0)
CHOLESTEROL: 152 mg/dL (ref <200)
HDL: 68 mg/dL (ref >50)
LDL CHOLESTEROL (CALC): 66 mg/dL
Non-HDL Cholesterol (Calc): 84 mg/dL (calc) (ref <130)
Triglycerides: 94 mg/dL (ref <150)

## 2018-07-18 LAB — COMPLETE METABOLIC PANEL WITH GFR
AG RATIO: 1.9 (calc) (ref 1.0–2.5)
ALKALINE PHOSPHATASE (APISO): 71 U/L (ref 33–130)
ALT: 19 U/L (ref 6–29)
AST: 18 U/L (ref 10–35)
Albumin: 3.9 g/dL (ref 3.6–5.1)
BUN/Creatinine Ratio: 17 (calc) (ref 6–22)
BUN: 33 mg/dL — ABNORMAL HIGH (ref 7–25)
CO2: 22 mmol/L (ref 20–32)
Calcium: 9.2 mg/dL (ref 8.6–10.4)
Chloride: 110 mmol/L (ref 98–110)
Creat: 1.92 mg/dL — ABNORMAL HIGH (ref 0.60–0.88)
GFR, Est African American: 26 mL/min/{1.73_m2} — ABNORMAL LOW (ref >=60)
GFR, Est Non African American: 22 mL/min/{1.73_m2} — ABNORMAL LOW (ref >=60)
GLOBULIN: 2.1 g/dL (ref 1.9–3.7)
Glucose, Bld: 104 mg/dL — ABNORMAL HIGH (ref 65–99)
Potassium: 5.1 mmol/L (ref 3.5–5.3)
SODIUM: 142 mmol/L (ref 135–146)
Total Bilirubin: 0.4 mg/dL (ref 0.2–1.2)
Total Protein: 6 g/dL — ABNORMAL LOW (ref 6.1–8.1)

## 2018-07-18 LAB — CBC WITH DIFFERENTIAL/PLATELET
BASOS PCT: 0.6 %
Basophils Absolute: 48 cells/uL (ref 0–200)
EOS PCT: 4.2 %
Eosinophils Absolute: 336 cells/uL (ref 15–500)
HCT: 32.6 % — ABNORMAL LOW (ref 35.0–45.0)
HEMOGLOBIN: 10.7 g/dL — AB (ref 11.7–15.5)
Lymphs Abs: 976 cells/uL (ref 850–3900)
MCH: 31.4 pg (ref 27.0–33.0)
MCHC: 32.8 g/dL (ref 32.0–36.0)
MCV: 95.6 fL (ref 80.0–100.0)
MONOS PCT: 7.1 %
MPV: 9.9 fL (ref 7.5–12.5)
NEUTROS ABS: 6072 {cells}/uL (ref 1500–7800)
Neutrophils Relative %: 75.9 %
PLATELETS: 325 10*3/uL (ref 140–400)
RBC: 3.41 10*6/uL — AB (ref 3.80–5.10)
RDW: 13 % (ref 11.0–15.0)
TOTAL LYMPHOCYTE: 12.2 %
WBC mixed population: 568 cells/uL (ref 200–950)
WBC: 8 10*3/uL (ref 3.8–10.8)

## 2018-07-18 LAB — LITHIUM LEVEL: LITHIUM LVL: 0.4 mmol/L — AB (ref 0.6–1.2)

## 2018-07-18 LAB — TSH: TSH: 2.65 m[IU]/L (ref 0.40–4.50)

## 2018-07-30 ENCOUNTER — Encounter: Payer: Medicare Other | Admitting: Internal Medicine

## 2018-08-07 ENCOUNTER — Other Ambulatory Visit: Payer: Self-pay | Admitting: Internal Medicine

## 2018-08-07 DIAGNOSIS — E785 Hyperlipidemia, unspecified: Secondary | ICD-10-CM

## 2018-08-13 ENCOUNTER — Other Ambulatory Visit: Payer: Self-pay

## 2018-08-13 ENCOUNTER — Encounter: Payer: Self-pay | Admitting: Internal Medicine

## 2018-08-13 ENCOUNTER — Non-Acute Institutional Stay: Payer: Medicare Other | Admitting: Internal Medicine

## 2018-08-13 VITALS — BP 130/70 | HR 97 | Temp 97.8°F | Ht 62.0 in | Wt 132.0 lb

## 2018-08-13 DIAGNOSIS — I10 Essential (primary) hypertension: Secondary | ICD-10-CM

## 2018-08-13 DIAGNOSIS — N183 Chronic kidney disease, stage 3 unspecified: Secondary | ICD-10-CM

## 2018-08-13 DIAGNOSIS — D638 Anemia in other chronic diseases classified elsewhere: Secondary | ICD-10-CM

## 2018-08-13 DIAGNOSIS — K219 Gastro-esophageal reflux disease without esophagitis: Secondary | ICD-10-CM

## 2018-08-13 DIAGNOSIS — E034 Atrophy of thyroid (acquired): Secondary | ICD-10-CM

## 2018-08-13 DIAGNOSIS — F319 Bipolar disorder, unspecified: Secondary | ICD-10-CM

## 2018-08-13 DIAGNOSIS — E7800 Pure hypercholesterolemia, unspecified: Secondary | ICD-10-CM

## 2018-08-13 NOTE — Progress Notes (Signed)
[facility]  Provider: Blanchie Serve MD   Location:  [place] [facility] Service:  Clinic (12)  PCP: Blanchie Serve, MD Patient Care Team: Blanchie Serve, MD as PCP - General (Internal Medicine) Lorretta Harp, MD as Consulting Physician (Cardiology) Aquilla Hacker, MD as Referring Physician (Psychiatry) Irene Shipper, MD as Consulting Physician (Gastroenterology) Newton Pigg, MD as Consulting Physician (Obstetrics and Gynecology) Mast, Man X, NP as Nurse Practitioner (Internal Medicine)  Extended Emergency Contact Information Primary Emergency Contact: Hobbs,Diane Address: [address]          [place], [place] [address] [name] of [place] Phone: [phone] Mobile Phone: [phone] Relation: Daughter   Goals of Care: Advanced Directive information Advanced Directives 08/13/2018  Does Patient Have a Medical Advance Directive? Yes  Type of Advance Directive [place]  Does patient want to make changes to medical advance directive? -  Copy of [place] in Chart? Yes  Would patient like information on creating a medical advance directive? -      Chief Complaint  Patient presents with  . Medical Management of Chronic Issues    Resident is being seen for a 6 month follow up.     HPI: Patient is a 84 y.o. female seen today for routine visit.   Hyperlipidemia- takes simvastatin, pt and caregiver unsure of dose of simvastatin. Tolerating well.   Hypothyroidism- takes levothyroxine 50 mcg daily. Denies cold intolerance or constipation  Bipolar disorder- mood overall stable. Takes cymbalta 60 mg daily and bupropion 450 mg daily, also on invega and lithium and is followed by psychiatry  gerd- takes omeprazole 20 mg daily. Denies reflux symptom  Anemia of chronic disease- with ckd stage 3. On ferrous sulfate  Hypertension- controlled BP. Currently on toprol xl 12.5 mg daily  Past  Medical History:  Diagnosis Date  . Anxiety and depression   . Aortic valve disease   . BBB (bundle branch block)    left, chronic  . Bipolar 1 disorder ([place])   . CKD (chronic kidney disease), stage III ([place])   . GERD (gastroesophageal reflux disease)   . Herpes zoster   . Hiatal hernia   . Hyperlipidemia   . Hypertension   . Hypertensive CKD (chronic kidney disease)   . Hypothyroid   . Osteoarthritis   . Osteopenia   . Renal artery stenosis (HCC)    left   Past Surgical History:  Procedure Laterality Date  . CARDIAC CATHETERIZATION  03/21/04   essentially normal coronary arteries with moderate decrease in left ventricular function, 80% ostial left renal artery stenosis  . COLONOSCOPY  11/11/2006   Dr. Scarlette Shorts , diverticulosis, colon polyps  . [place]  . PANENDOSCOPY  11/11/2006   Dr. Scarlette Shorts, esophageal stricture s/p balloon dilation, GERD, Zenker's Diverticulum    reports that she has never smoked. She has never used smokeless tobacco. She reports that she does not drink alcohol or use drugs. Social History   Socioeconomic History  . Marital status: Widowed    Spouse name: Not on file  . Number of children: 1  . Years of education: Not on file  . Highest education level: Not on file  Occupational History  . Occupation: retired Education administrator  . Financial resource strain: Not hard at all  . Food insecurity:    Worry: Never true    Inability: Never true  .  Transportation needs:    Medical: No    Non-medical: No  Tobacco Use  . Smoking status: Never Smoker  . Smokeless tobacco: Never Used  Substance and Sexual Activity  . Alcohol use: No  . Drug use: No  . Sexual activity: Never  Lifestyle  . Physical activity:    Days per week: 2 days    Minutes per session: 30 min  . Stress: Only a little  Relationships  . Social connections:    Talks on phone: More than three times a week    Gets together: More than three times a week      Attends religious service: More than 4 times per year    Active member of club or organization: No    Attends meetings of clubs or organizations: Never    Relationship status: Widowed  . Intimate partner violence:    Fear of current or ex partner: No    Emotionally abused: No    Physically abused: No    Forced sexual activity: No  Other Topics Concern  . Not on file  Social History Narrative   Lives at [facility]   Widowed   Never smoked   Alcohol none   Exercise 3 times a week   POA      Diet?       Do you drink/eat things with caffeine?  Yes      Marital status? married                                   What year were you married? 1950      Do you live in a house, apartment, assisted living, condo, trailer, etc.? [place]      Is it one or more stories? Yes (elevators)      How many persons live in your home? 2      Do you have any pets in your home? (please list) none      Current or past profession: Network engineer      Do you exercise?     yes                                 Type & how often? 3x week   Walks with walker      Do you have a living will? yes      Do you have a DNR form?   yes                               If not, do you want to discuss one?      Do you have signed POA/HPOA for forms?     Functional Status Survey:    Family History  Problem Relation Age of Onset  . Stroke Father   . Stroke Sister 31  . Diabetes Brother   . Appendicitis Mother   . Diabetes Brother   . Heart disease Brother   . Colon cancer Neg Hx   . Liver cancer Neg Hx     Health Maintenance  Topic Date Due  . DEXA SCAN  10/04/1990  . INFLUENZA VACCINE  06/26/2018  . TETANUS/TDAP  08/03/2018  . PNA vac Low Risk Adult  Completed    No Known Allergies  Outpatient Encounter Medications as  of 08/13/2018  Medication Sig  . aspirin EC 81 MG tablet Take 81 mg by mouth daily.  Marland Kitchen buPROPion (WELLBUTRIN XL) 150 MG 24 hr tablet Take 450 mg by mouth daily.  .  DULoxetine (CYMBALTA) 60 MG capsule Take 60 mg by mouth at bedtime.   Water engineer Bandages & Supports (MEDICAL COMPRESSION STOCKINGS) MISC Apply compression stockings to both legs in morning and remove it at bedtime for leg edema  . ferrous sulfate (SLOW IRON) 160 (50 Fe) MG TBCR SR tablet Take 1 tablet (160 mg total) by mouth daily.  Marland Kitchen L-Methylfolate (DEPLIN) 7.5 MG TABS Take 1 tablet by mouth daily.   Marland Kitchen levothyroxine (SYNTHROID, LEVOTHROID) 50 MCG tablet TAKE ONE TABLET BY MOUTH EVERY MORNING ON AN EMPTY STOMACH FOR THYROID  . lithium carbonate 150 MG capsule Take 150 mg by mouth daily.  Marland Kitchen loperamide (IMODIUM A-D) 2 MG tablet 1/2 to 1 tablet daily as needed to control diarrhea if you have 3 or more loose stool.  . metoprolol succinate (TOPROL-XL) 25 MG 24 hr tablet Take 0.5 tablets (12.5 mg total) by mouth daily.  Marland Kitchen omeprazole (PRILOSEC) 20 MG capsule Take 1 capsule (20 mg total) by mouth daily.  . paliperidone (INVEGA) 3 MG 24 hr tablet Take 3 mg by mouth daily.  . simvastatin (ZOCOR) 10 MG tablet Take 10 mg by mouth daily.  . simvastatin (ZOCOR) 5 MG tablet TAKE ONE TABLET BY MOUTH EVERY NIGHT AT BEDTIME   No facility-administered encounter medications on file as of 08/13/2018.     Review of Systems  Constitutional: Negative for appetite change, chills, fatigue and fever.       She has care giver 6 days a week from 9:30 am to 12:30 pm and 7 pm- 9 pm.   HENT: Positive for rhinorrhea. Negative for congestion, mouth sores, sore throat and trouble swallowing.   Eyes: Positive for visual disturbance. Negative for redness.       Has reading glasses  Respiratory: Negative for cough, shortness of breath and wheezing.   Cardiovascular: Negative for chest pain and palpitations.  Gastrointestinal: Negative for abdominal pain, blood in stool, diarrhea, nausea and vomiting.  Genitourinary: Negative for dysuria.       Uses depends, makes it to the bathroom during daytime  Musculoskeletal: Negative for  arthralgias and back pain.       Had a fall 4 weeks back after losing balance. Does not remember using a walker. Was in the room by herself. No fall after that. Four wheel walker for ambulation, does not remember to use her walker every time per care giver  Skin: Negative for rash and wound.  Neurological: Negative for dizziness and numbness.  Psychiatric/Behavioral: Positive for confusion and decreased concentration. Negative for dysphoric mood and sleep disturbance. The patient is not nervous/anxious.     Vitals:   08/13/18 0953  BP: 130/70  Pulse: 97  Temp: 97.8 F (36.6 C)  TempSrc: Oral  SpO2: 98%  Weight: 132 lb (59.9 kg)  Height: _0  (1.575 m)   Body mass index is 24.14 kg/m.   Wt Readings from Last 3 Encounters:  08/13/18 132 lb (59.9 kg)  07/16/18 133 lb 6.4 oz (60.5 kg)  07/11/18 130 lb 1.1 oz (59 kg)   Physical Exam  Constitutional: She is oriented to person, place, and time. She appears well-developed and well-nourished. No distress.  HENT:  Head: Normocephalic and atraumatic.  Nose: Nose normal.  Mouth/Throat: Oropharynx is clear and moist. No oropharyngeal  exudate.  Eyes: Pupils are equal, round, and reactive to light. Conjunctivae and EOM are normal. Right eye exhibits no discharge. Left eye exhibits no discharge.  Neck: Normal range of motion. Neck supple.  Cardiovascular: Normal rate, regular rhythm and intact distal pulses.  Pulmonary/Chest: Effort normal and breath sounds normal. She has no wheezes. She has no rales.  Abdominal: Soft. Bowel sounds are normal. She exhibits no mass. There is no tenderness. There is no guarding.  Musculoskeletal: Normal range of motion.  Can move all 4 ext, unsteady gait, walker for ambulation  Lymphadenopathy:    She has no cervical adenopathy.  Neurological: She is alert and oriented to person, place, and time. She exhibits normal muscle tone.  Kyphosis present  Skin: Skin is warm and dry. She is not diaphoretic.    Psychiatric: She has a normal mood and affect.    Labs reviewed: Basic Metabolic Panel: Recent Labs    03/13/18 0740 07/11/18 2243 07/17/18 0000 08/01/18 0000  NA 140 142 142 140  K 4.1 4.3 5.1 4.8  CL 111* 108 110 111*  CO2 20  --  22 22  GLUCOSE 104* 95 104* 102*  BUN 40* 26* 33* 38*  CREATININE 1.98* 1.80* 1.92* 2.12*  CALCIUM 9.3  --  9.2 9.6   Liver Function Tests: Recent Labs    02/24/18 0000 07/17/18 0000  AST 17 18  ALT 15 19  BILITOT 0.3 0.4  PROT 6.2 6.0*   No results for input(s): LIPASE, AMYLASE in the last 8760 hours. No results for input(s): AMMONIA in the last 8760 hours. CBC: Recent Labs    11/04/17 1000 02/24/18 0000 07/11/18 2243 07/17/18 0000  WBC 8.7 8.2  --  8.0  NEUTROABS  --   --   --  6,072  HGB 10.5* 10.5* 11.6* 10.7*  HCT 32.2* 32.1* 34.0* 32.6*  MCV 96.4 95.5  --  95.6  PLT 304 315  --  325   Cardiac Enzymes: No results for input(s): CKTOTAL, CKMB, CKMBINDEX, TROPONINI in the last 8760 hours. BNP: Invalid input(s): POCBNP Lab Results  Component Value Date   HGBA1C 5.3 01/31/2018   Lab Results  Component Value Date   TSH 2.65 07/17/2018   Lab Results  Component Value Date   VITAMINB12 283 12/26/2016   Lab Results  Component Value Date   FOLATE >24.0 12/26/2016   Lab Results  Component Value Date   IRON 64 12/26/2016   TIBC 304 12/26/2016   FERRITIN 49 11/04/2017    Lipid Panel: Recent Labs    11/04/17 1000 02/24/18 0000 07/17/18 0000  CHOL 147 151 152  HDL 71 69 68  LDLCALC 55 65 66  TRIG 125 89 94  CHOLHDL 2.1 2.2 2.2   Lab Results  Component Value Date   HGBA1C 5.3 01/31/2018    Procedures since last visit: No results found.  Assessment/Plan  1. Essential hypertension Continue toprol xl, reviewed rena function, bmp check next visit  2. Gastroesophageal reflux disease without esophagitis Continue PPI, controlled symptom  3. Hypothyroidism due to acquired atrophy of thyroid Reviewed TSH,  no change to dosing of levothyroxine, TSH prior to next visit  4. CKD (chronic kidney disease) stage 3, GFR 30-59 ml/min (HCC) Encouraged hydration with bump in her BUN, cr and Cl. Monitor bmp  5. Pure hypercholesterolemia Continue simvastatin, pt needs to take 5 mg daily. Order clarification provided after caregiver called and confirmed the dose she is currently taking.   6. Anemia, chronic  disease Continue iron supplement, reviewed cbc. check cbc prior to next visit  7. Bipolar affective disorder, remission status unspecified ([place]) No changes made to her pyschiatry meds, follow with psych service    Labs/tests ordered:  Cbc, bmp with eGFR, TSH in 3 months  Next appointment: 3 months  Communication: reviewed care plan with patient and her caregiver    Blanchie Serve, MD Internal Medicine [address], [place] [address] Cell Phone (Monday-Friday 8 am - 5 pm): [phone] On Call: [phone] and follow prompts after 5 pm and on weekends Office Phone: [phone] Office Fax: [phone]

## 2018-08-14 LAB — VITAMIN D 1,25 DIHYDROXY
VITAMIN D3 1, 25 (OH): 27 pg/mL
Vitamin D 1, 25 (OH)2 Total: 27 pg/mL (ref 18–72)
Vitamin D2 1, 25 (OH)2: <8 pg/mL

## 2018-08-14 LAB — BASIC METABOLIC PANEL
BUN / CREAT RATIO: 18 (calc) (ref 6–22)
BUN: 38 mg/dL — AB (ref 7–25)
CO2: 22 mmol/L (ref 20–32)
CREATININE: 2.12 mg/dL — AB (ref 0.60–0.88)
Calcium: 9.6 mg/dL (ref 8.6–10.4)
Chloride: 111 mmol/L — ABNORMAL HIGH (ref 98–110)
Glucose, Bld: 102 mg/dL — ABNORMAL HIGH (ref 65–99)
POTASSIUM: 4.8 mmol/L (ref 3.5–5.3)
SODIUM: 140 mmol/L (ref 135–146)

## 2018-08-18 ENCOUNTER — Telehealth: Payer: Self-pay

## 2018-08-18 MED ORDER — VITAMIN D (ERGOCALCIFEROL) 1.25 MG (50000 UNIT) PO CAPS: 50000.0000 [IU] | ORAL_CAPSULE | ORAL | 3 refills | Status: DC

## 2018-08-18 NOTE — Telephone Encounter (Signed)
Medication list has been updated and rx was sent to the pharmacy electronically.

## 2018-08-18 NOTE — Telephone Encounter (Signed)
-----   Message from Blanchie Serve, MD sent at 08/15/2018  3:22 PM EDT ----- Your vitamin d level is low. I want you to take vitamin d 50,000 iu once a week for 12 weeks followed by vitamin d level check. Diagnosis code is vitamin d deficiency. Send script to pharmacy and notify patient.

## 2018-09-11 ENCOUNTER — Encounter: Payer: Self-pay | Admitting: Family Medicine

## 2018-09-30 ENCOUNTER — Non-Acute Institutional Stay: Payer: Medicare Other | Admitting: Family Medicine

## 2018-09-30 ENCOUNTER — Encounter: Payer: Self-pay | Admitting: Family Medicine

## 2018-09-30 DIAGNOSIS — E034 Atrophy of thyroid (acquired): Secondary | ICD-10-CM | POA: Diagnosis not present

## 2018-09-30 DIAGNOSIS — F319 Bipolar disorder, unspecified: Secondary | ICD-10-CM | POA: Diagnosis not present

## 2018-09-30 DIAGNOSIS — N183 Chronic kidney disease, stage 3 unspecified: Secondary | ICD-10-CM

## 2018-09-30 DIAGNOSIS — I359 Nonrheumatic aortic valve disorder, unspecified: Secondary | ICD-10-CM

## 2018-09-30 DIAGNOSIS — I10 Essential (primary) hypertension: Secondary | ICD-10-CM

## 2018-09-30 DIAGNOSIS — G3184 Mild cognitive impairment, so stated: Secondary | ICD-10-CM

## 2018-09-30 NOTE — Progress Notes (Signed)
Provider:  Alain Honey, MD Location:  [facility] Room Number: 20A Place of Service:  ALF ([phone])  PCP: Ngetich, Nelda Bucks, NP Patient Care Team: Ngetich, Nelda Bucks, NP as PCP - General (Family Medicine) Lorretta Harp, MD as Consulting Physician (Cardiology) Aquilla Hacker, MD as Referring Physician (Psychiatry) Irene Shipper, MD as Consulting Physician (Gastroenterology) Newton Pigg, MD as Consulting Physician (Obstetrics and Gynecology) Mast, Man X, NP as Nurse Practitioner (Internal Medicine)  Extended Emergency Contact Information Primary Emergency Contact: Hobbs,Diane Address: [address]          [place], [place] [address] [name] of [place] Phone: [phone] Mobile Phone: [phone] Relation: Daughter  Code Status: FULL  Goals of Care: Advanced Directive information Advanced Directives 09/30/2018  Does Patient Have a Medical Advance Directive? Yes  Type of Advance Directive [place]  Does patient want to make changes to medical advance directive? No - Patient declined  Copy of [place] in Chart? Yes  Would patient like information on creating a medical advance directive? -      Chief Complaint  Patient presents with  . Medical Management of Chronic Issues    Routine Visit    HPI: Patient is a 84 y.o. female seen today for medical management of chronic problems including: Bipolar disorder, hypertension, GERD, hypothyroidism, and hyperlipidemia.  She currently resides in an assisted living.  I visited her today with an nurse of the unit.  Patient denies and nurse also confirms no recent issues or problems.  Appetite is good sleeping well.  There has been a history of falls but none recently.  She is followed by psychiatry for her bipolar disorder and apparently that is doing well medications they are include lithium Cymbalta and Wellbutrin and paliperidone  Past Medical History:    Diagnosis Date  . Anxiety and depression   . Aortic valve disease   . BBB (bundle branch block)    left, chronic  . Bipolar 1 disorder ([place])   . CKD (chronic kidney disease), stage III ([place])   . GERD (gastroesophageal reflux disease)   . Herpes zoster   . Hiatal hernia   . Hyperlipidemia   . Hypertension   . Hypertensive CKD (chronic kidney disease)   . Hypothyroid   . Osteoarthritis   . Osteopenia   . Renal artery stenosis (HCC)    left   Past Surgical History:  Procedure Laterality Date  . CARDIAC CATHETERIZATION  03/21/04   essentially normal coronary arteries with moderate decrease in left ventricular function, 80% ostial left renal artery stenosis  . COLONOSCOPY  11/11/2006   Dr. Scarlette Shorts , diverticulosis, colon polyps  . [place]  . PANENDOSCOPY  11/11/2006   Dr. Scarlette Shorts, esophageal stricture s/p balloon dilation, GERD, Zenker's Diverticulum    reports that she has never smoked. She has never used smokeless tobacco. She reports that she does not drink alcohol or use drugs. Social History   Socioeconomic History  . Marital status: Widowed    Spouse name: Not on file  . Number of children: 1  . Years of education: Not on file  . Highest education level: Not on file  Occupational History  . Occupation: retired Education administrator  . Financial resource strain: Not hard at all  . Food insecurity:    Worry: Never true    Inability: Never true  . Transportation needs:    Medical: No  Non-medical: No  Tobacco Use  . Smoking status: Never Smoker  . Smokeless tobacco: Never Used  Substance and Sexual Activity  . Alcohol use: No  . Drug use: No  . Sexual activity: Never  Lifestyle  . Physical activity:    Days per week: 2 days    Minutes per session: 30 min  . Stress: Only a little  Relationships  . Social connections:    Talks on phone: More than three times a week    Gets together: More than three times a week    Attends  religious service: More than 4 times per year    Active member of club or organization: No    Attends meetings of clubs or organizations: Never    Relationship status: Widowed  . Intimate partner violence:    Fear of current or ex partner: No    Emotionally abused: No    Physically abused: No    Forced sexual activity: No  Other Topics Concern  . Not on file  Social History Narrative   Lives at [facility]   Widowed   Never smoked   Alcohol none   Exercise 3 times a week   POA      Diet?       Do you drink/eat things with caffeine?  Yes      Marital status? married                                   What year were you married? 1950      Do you live in a house, apartment, assisted living, condo, trailer, etc.? [place]      Is it one or more stories? Yes (elevators)      How many persons live in your home? 2      Do you have any pets in your home? (please list) none      Current or past profession: Network engineer      Do you exercise?     yes                                 Type & how often? 3x week   Walks with walker      Do you have a living will? yes      Do you have a DNR form?   yes                               If not, do you want to discuss one?      Do you have signed POA/HPOA for forms?     Functional Status Survey:    Family History  Problem Relation Age of Onset  . Stroke Father   . Stroke Sister 70  . Diabetes Brother   . Appendicitis Mother   . Diabetes Brother   . Heart disease Brother   . Colon cancer Neg Hx   . Liver cancer Neg Hx     Health Maintenance  Topic Date Due  . DEXA SCAN  10/04/1990  . TETANUS/TDAP  09/25/2029 (Originally 08/03/2018)  . INFLUENZA VACCINE  Completed  . PNA vac Low Risk Adult  Completed    No Known Allergies  Outpatient Encounter Medications as of 09/30/2018  Medication Sig  . aspirin EC  81 MG tablet Take 81 mg by mouth daily.  Marland Kitchen buPROPion (WELLBUTRIN XL) 150 MG 24 hr tablet Take 450 mg by mouth  daily.  . DULoxetine (CYMBALTA) 60 MG capsule Take 60 mg by mouth at bedtime.   . ferrous sulfate (SLOW IRON) 160 (50 Fe) MG TBCR SR tablet Take 1 tablet (160 mg total) by mouth daily.  Marland Kitchen L-Methylfolate (DEPLIN) 7.5 MG TABS Take 1 tablet by mouth daily.   Marland Kitchen levothyroxine (SYNTHROID, LEVOTHROID) 50 MCG tablet TAKE ONE TABLET BY MOUTH EVERY MORNING ON AN EMPTY STOMACH FOR THYROID  . lithium carbonate 150 MG capsule Take 150 mg by mouth daily.  Marland Kitchen loperamide (IMODIUM A-D) 2 MG tablet 1/2 to 1 tablet daily as needed to control diarrhea if you have 3 or more loose stool.  . metoprolol succinate (TOPROL-XL) 25 MG 24 hr tablet Take 0.5 tablets (12.5 mg total) by mouth daily.  Marland Kitchen omeprazole (PRILOSEC) 20 MG capsule Take 1 capsule (20 mg total) by mouth daily.  . paliperidone (INVEGA) 3 MG 24 hr tablet Take 3 mg by mouth daily.  . Psyllium (METAMUCIL FIBER PO) 1 tablespoon by mouth once a morning. mix with water or juice  . simvastatin (ZOCOR) 5 MG tablet TAKE ONE TABLET BY MOUTH EVERY NIGHT AT BEDTIME  . Vitamin D, Ergocalciferol, (DRISDOL) 50000 units CAPS capsule Take 1 capsule (50,000 Units total) by mouth every 7 (seven) days.  Regino Schultze Bandages & Supports (MEDICAL COMPRESSION STOCKINGS) MISC Apply compression stockings to both legs in morning and remove it at bedtime for leg edema   No facility-administered encounter medications on file as of 09/30/2018.     Review of Systems  Constitutional: Negative.   HENT: Negative.   Eyes: Negative.   Respiratory: Negative.   Cardiovascular: Negative.   Gastrointestinal: Negative.   Skin: Negative.   Neurological: Negative.   Hematological: Negative.   Psychiatric/Behavioral: Positive for confusion.    Vitals:   09/30/18 0935  BP: (!) 140/52  Pulse: (!) 52  Resp: 18  Temp: 98.1 F (36.7 C)  TempSrc: Oral  SpO2: 91%  Weight: 137 lb 12.8 oz (62.5 kg)  Height: 4\' 8"  (1.422 m)   Body mass index is 30.89 kg/m. Physical Exam  Constitutional:  She is oriented to person, place, and time. She appears well-developed and well-nourished.  Patient is responsive to questions and is appropriate  HENT:  Head: Normocephalic.  Mouth/Throat: Oropharynx is clear and moist.  Eyes: Pupils are equal, round, and reactive to light.  Neck: Normal range of motion.  Cardiovascular: Normal rate and regular rhythm.  Murmur heard. Pulmonary/Chest: Effort normal and breath sounds normal.  Abdominal: Soft.  Musculoskeletal: Normal range of motion.  Neurological: She is alert and oriented to person, place, and time.  Skin: Skin is warm.  Nursing note and vitals reviewed.   Labs reviewed: Basic Metabolic Panel: Recent Labs    03/13/18 0740 07/11/18 2243 07/17/18 0000 08/01/18 0000  NA 140 142 142 140  K 4.1 4.3 5.1 4.8  CL 111* 108 110 111*  CO2 20  --  22 22  GLUCOSE 104* 95 104* 102*  BUN 40* 26* 33* 38*  CREATININE 1.98* 1.80* 1.92* 2.12*  CALCIUM 9.3  --  9.2 9.6   Liver Function Tests: Recent Labs    02/24/18 0000 07/17/18 0000  AST 17 18  ALT 15 19  BILITOT 0.3 0.4  PROT 6.2 6.0*   No results for input(s): LIPASE, AMYLASE in the last 8760 hours.  No results for input(s): AMMONIA in the last 8760 hours. CBC: Recent Labs    11/04/17 1000 02/24/18 0000 07/11/18 2243 07/17/18 0000  WBC 8.7 8.2  --  8.0  NEUTROABS  --   --   --  6,072  HGB 10.5* 10.5* 11.6* 10.7*  HCT 32.2* 32.1* 34.0* 32.6*  MCV 96.4 95.5  --  95.6  PLT 304 315  --  325   Cardiac Enzymes: No results for input(s): CKTOTAL, CKMB, CKMBINDEX, TROPONINI in the last 8760 hours. BNP: Invalid input(s): POCBNP Lab Results  Component Value Date   HGBA1C 5.3 01/31/2018   Lab Results  Component Value Date   TSH 2.65 07/17/2018   Lab Results  Component Value Date   VITAMINB12 283 12/26/2016   Lab Results  Component Value Date   FOLATE >24.0 12/26/2016   Lab Results  Component Value Date   IRON 64 12/26/2016   TIBC 304 12/26/2016   FERRITIN 49  11/04/2017    Imaging and Procedures obtained prior to SNF admission: Ct Head Wo Contrast  Result Date: 07/11/2018 CLINICAL DATA:  Unwitnessed fall at 2 p.m. this afternoon. State on the ground until 8 p.m. No loss of consciousness. Swelling and redness to the right eye. EXAM: CT HEAD WITHOUT CONTRAST CT MAXILLOFACIAL WITHOUT CONTRAST CT CERVICAL SPINE WITHOUT CONTRAST TECHNIQUE: Multidetector CT imaging of the head, cervical spine, and maxillofacial structures were performed using the standard protocol without intravenous contrast. Multiplanar CT image reconstructions of the cervical spine and maxillofacial structures were also generated. COMPARISON:  CT head 06/14/2018. CT head max cervical 06/29/2016 FINDINGS: CT HEAD FINDINGS Brain: Diffuse cerebral atrophy, mild for age. Mild ventricular dilatation consistent with central atrophy. Patchy low-attenuation changes in the deep white matter consistent with small vessel ischemia. No mass-effect or midline shift. No abnormal extra-axial fluid collections. Gray-white matter junctions are distinct. Basal cisterns are not effaced. No acute intracranial hemorrhage. Vascular: Mild intracranial arterial vascular calcifications. Skull: Calvarium appears intact. No acute depressed fractures. Other: Small subcutaneous scalp hematoma over the left anterior frontal region. CT MAXILLOFACIAL FINDINGS Osseous: No fracture or mandibular dislocation. No destructive process. Orbits: Small right periorbital hematoma. No retrobulbar involvement. Globes and extraocular muscles appear intact and symmetrical. Sinuses: Mucosal thickening in the paranasal sinuses. No acute air-fluid levels. Mastoid air cells are clear. Soft tissues: No significant soft tissue swelling or hematoma. CT CERVICAL SPINE FINDINGS Alignment: Normal alignment of the cervical spine and facet joints. C1-2 articulation appears intact. Skull base and vertebrae: Skull base appears intact. No vertebral compression  deformities. No focal bone lesion or bone destruction. Bone cortex appears intact. Soft tissues and spinal canal: No prevertebral soft tissue swelling. No abnormal paraspinal soft tissue mass or infiltration. Disc levels: Degenerative changes throughout the cervical spine with narrowed disc spaces and endplate hypertrophic changes most prominent at C5-6, C6-7, and C7-T1 levels. Degenerative changes throughout the facet joints. Upper chest: Calcification and scarring in the lung apices, likely postinflammatory. Vascular calcifications. Other: None. IMPRESSION: 1. No acute intracranial abnormalities. Chronic atrophy and small vessel ischemic changes. 2. Small right periorbital hematoma. No retrobulbar involvement. No acute displaced orbital or facial fractures. 3. Normal alignment of the cervical spine. Degenerative changes. No acute displaced fractures identified. Electronically Signed   By: Lucienne Capers M.D.   On: 07/11/2018 21:52   Ct Cervical Spine Wo Contrast  Result Date: 07/11/2018 CLINICAL DATA:  Unwitnessed fall at 2 p.m. this afternoon. State on the ground until 8 p.m. No loss of consciousness. Swelling  and redness to the right eye. EXAM: CT HEAD WITHOUT CONTRAST CT MAXILLOFACIAL WITHOUT CONTRAST CT CERVICAL SPINE WITHOUT CONTRAST TECHNIQUE: Multidetector CT imaging of the head, cervical spine, and maxillofacial structures were performed using the standard protocol without intravenous contrast. Multiplanar CT image reconstructions of the cervical spine and maxillofacial structures were also generated. COMPARISON:  CT head 06/14/2018. CT head max cervical 06/29/2016 FINDINGS: CT HEAD FINDINGS Brain: Diffuse cerebral atrophy, mild for age. Mild ventricular dilatation consistent with central atrophy. Patchy low-attenuation changes in the deep white matter consistent with small vessel ischemia. No mass-effect or midline shift. No abnormal extra-axial fluid collections. Gray-white matter junctions are  distinct. Basal cisterns are not effaced. No acute intracranial hemorrhage. Vascular: Mild intracranial arterial vascular calcifications. Skull: Calvarium appears intact. No acute depressed fractures. Other: Small subcutaneous scalp hematoma over the left anterior frontal region. CT MAXILLOFACIAL FINDINGS Osseous: No fracture or mandibular dislocation. No destructive process. Orbits: Small right periorbital hematoma. No retrobulbar involvement. Globes and extraocular muscles appear intact and symmetrical. Sinuses: Mucosal thickening in the paranasal sinuses. No acute air-fluid levels. Mastoid air cells are clear. Soft tissues: No significant soft tissue swelling or hematoma. CT CERVICAL SPINE FINDINGS Alignment: Normal alignment of the cervical spine and facet joints. C1-2 articulation appears intact. Skull base and vertebrae: Skull base appears intact. No vertebral compression deformities. No focal bone lesion or bone destruction. Bone cortex appears intact. Soft tissues and spinal canal: No prevertebral soft tissue swelling. No abnormal paraspinal soft tissue mass or infiltration. Disc levels: Degenerative changes throughout the cervical spine with narrowed disc spaces and endplate hypertrophic changes most prominent at C5-6, C6-7, and C7-T1 levels. Degenerative changes throughout the facet joints. Upper chest: Calcification and scarring in the lung apices, likely postinflammatory. Vascular calcifications. Other: None. IMPRESSION: 1. No acute intracranial abnormalities. Chronic atrophy and small vessel ischemic changes. 2. Small right periorbital hematoma. No retrobulbar involvement. No acute displaced orbital or facial fractures. 3. Normal alignment of the cervical spine. Degenerative changes. No acute displaced fractures identified. Electronically Signed   By: Lucienne Capers M.D.   On: 07/11/2018 21:52   Ct Maxillofacial Wo Contrast  Result Date: 07/11/2018 CLINICAL DATA:  Unwitnessed fall at 2 p.m. this  afternoon. State on the ground until 8 p.m. No loss of consciousness. Swelling and redness to the right eye. EXAM: CT HEAD WITHOUT CONTRAST CT MAXILLOFACIAL WITHOUT CONTRAST CT CERVICAL SPINE WITHOUT CONTRAST TECHNIQUE: Multidetector CT imaging of the head, cervical spine, and maxillofacial structures were performed using the standard protocol without intravenous contrast. Multiplanar CT image reconstructions of the cervical spine and maxillofacial structures were also generated. COMPARISON:  CT head 06/14/2018. CT head max cervical 06/29/2016 FINDINGS: CT HEAD FINDINGS Brain: Diffuse cerebral atrophy, mild for age. Mild ventricular dilatation consistent with central atrophy. Patchy low-attenuation changes in the deep white matter consistent with small vessel ischemia. No mass-effect or midline shift. No abnormal extra-axial fluid collections. Gray-white matter junctions are distinct. Basal cisterns are not effaced. No acute intracranial hemorrhage. Vascular: Mild intracranial arterial vascular calcifications. Skull: Calvarium appears intact. No acute depressed fractures. Other: Small subcutaneous scalp hematoma over the left anterior frontal region. CT MAXILLOFACIAL FINDINGS Osseous: No fracture or mandibular dislocation. No destructive process. Orbits: Small right periorbital hematoma. No retrobulbar involvement. Globes and extraocular muscles appear intact and symmetrical. Sinuses: Mucosal thickening in the paranasal sinuses. No acute air-fluid levels. Mastoid air cells are clear. Soft tissues: No significant soft tissue swelling or hematoma. CT CERVICAL SPINE FINDINGS Alignment: Normal alignment of the cervical  spine and facet joints. C1-2 articulation appears intact. Skull base and vertebrae: Skull base appears intact. No vertebral compression deformities. No focal bone lesion or bone destruction. Bone cortex appears intact. Soft tissues and spinal canal: No prevertebral soft tissue swelling. No abnormal  paraspinal soft tissue mass or infiltration. Disc levels: Degenerative changes throughout the cervical spine with narrowed disc spaces and endplate hypertrophic changes most prominent at C5-6, C6-7, and C7-T1 levels. Degenerative changes throughout the facet joints. Upper chest: Calcification and scarring in the lung apices, likely postinflammatory. Vascular calcifications. Other: None. IMPRESSION: 1. No acute intracranial abnormalities. Chronic atrophy and small vessel ischemic changes. 2. Small right periorbital hematoma. No retrobulbar involvement. No acute displaced orbital or facial fractures. 3. Normal alignment of the cervical spine. Degenerative changes. No acute displaced fractures identified. Electronically Signed   By: Lucienne Capers M.D.   On: 07/11/2018 21:52    Assessment/Plan 1. Essential hypertension Blood pressures have been good.  She takes metoprolol 12.5 mg  2. Aortic valve disorder Murmur appreciated.  There are no overt signs of failure or syncope  3. Hypothyroidism due to acquired atrophy of thyroid Patient currently takes thyroid replacement hormone.  Most recent TSH was 2.65, in October  4. Bipolar affective disorder, remission status unspecified ([place]) Followed by psychiatry is on multidrug regimen for bipolar disorder  5. Mild cognitive impairment Oriented x3.  Seems to be well adjusted and appropriate for her current place of living.  6. CKD (chronic kidney disease) stage 3, GFR 30-59 ml/min (HCC) Creatinine has fluctuated minimally over the past 6 months.  Maintain hydration   Family/ staff Communication: Findings communicated with nursing staff  Labs/tests ordered: bmp  Lillette Boxer. [place], [place] [address] [place], [place] Office [phone]

## 2018-11-05 ENCOUNTER — Encounter: Payer: Self-pay | Admitting: Family

## 2018-11-05 ENCOUNTER — Non-Acute Institutional Stay: Payer: Medicare Other | Admitting: Family

## 2018-11-05 DIAGNOSIS — Y92129 Unspecified place in nursing home as the place of occurrence of the external cause: Secondary | ICD-10-CM

## 2018-11-05 DIAGNOSIS — W19XXXA Unspecified fall, initial encounter: Secondary | ICD-10-CM

## 2018-11-05 DIAGNOSIS — J3489 Other specified disorders of nose and nasal sinuses: Secondary | ICD-10-CM | POA: Diagnosis not present

## 2018-11-05 DIAGNOSIS — R2681 Unsteadiness on feet: Secondary | ICD-10-CM

## 2018-11-05 NOTE — Progress Notes (Signed)
Location:  [facility] Room Number: 20A Place of Service:  ALF (13) Provider: Baxter Gonzalez FNP-C  Robby Pirani, Nelda Bucks, NP  Patient Care Team: Etosha Wetherell, Nelda Bucks, NP as PCP - General (Family Medicine) Lorretta Harp, MD as Consulting Physician (Cardiology) Aquilla Hacker, MD as Referring Physician (Psychiatry) Irene Shipper, MD as Consulting Physician (Gastroenterology) Newton Pigg, MD as Consulting Physician (Obstetrics and Gynecology) Mast, Man X, NP as Nurse Practitioner (Internal Medicine)  Extended Emergency Contact Information Primary Emergency Contact: Hobbs,Diane Address: [address]          [place], [place] [address] [name] of [place] Phone: [phone] Mobile Phone: [phone] Relation: Daughter  Code Status:  DNR Goals of care: Advanced Directive information Advanced Directives 11/05/2018  Does Patient Have a Medical Advance Directive? Yes  Type of Advance Directive [place]  Does patient want to make changes to medical advance directive? No - Patient declined  Copy of [place] in Chart? Yes - validated most recent copy scanned in chart (See row information)  Would patient like information on creating a medical advance directive? -     Chief Complaint  Patient presents with  . Acute Visit    Fall    HPI:  Pt is a 84 y.o. female seen today at [facility] for an acute visit for evaluation of fall episode.she is seen in her room with facility Nurse supervisor.Nurse reported via SBAR that patient was observed sitting on the bathroom floor early this morning.she told nursing staff that she attempted to pick up cracker crumbs from the floor.Nurse reported redness on patient's head after fall but resolved. She denies any pain.she complains of runny nose x 1 day.she was given loratadine 10 mg tablet per facility standing orders.she denies any fever,chills or cough.    Past Medical  History:  Diagnosis Date  . Anxiety and depression   . Aortic valve disease   . BBB (bundle branch block)    left, chronic  . Bipolar 1 disorder ([place])   . CKD (chronic kidney disease), stage III ([place])   . GERD (gastroesophageal reflux disease)   . Herpes zoster   . Hiatal hernia   . Hyperlipidemia   . Hypertension   . Hypertensive CKD (chronic kidney disease)   . Hypothyroid   . Osteoarthritis   . Osteopenia   . Renal artery stenosis (HCC)    left   Past Surgical History:  Procedure Laterality Date  . CARDIAC CATHETERIZATION  03/21/04   essentially normal coronary arteries with moderate decrease in left ventricular function, 80% ostial left renal artery stenosis  . COLONOSCOPY  11/11/2006   Dr. Scarlette Shorts , diverticulosis, colon polyps  . [place]  . PANENDOSCOPY  11/11/2006   Dr. Scarlette Shorts, esophageal stricture s/p balloon dilation, GERD, Zenker's Diverticulum    No Known Allergies  Outpatient Encounter Medications as of 11/05/2018  Medication Sig  . acetaminophen (TYLENOL) 325 MG tablet Take 650 mg by mouth every 4 (four) hours as needed. Give 650mg  every four hours for fever greater than 100.6 x48 hours. Notify MD of initial fever and temperature greater than 101. Do not exceed 3,000 mg in 24 hours.  Marland Kitchen aspirin 81 MG chewable tablet Chew 81 mg by mouth daily.  Marland Kitchen buPROPion (WELLBUTRIN XL) 150 MG 24 hr tablet Take 450 mg by mouth daily. 3 tabs  . DULoxetine (CYMBALTA) 60 MG capsule Take 60 mg by mouth at  bedtime.   Water engineer Bandages & Supports (MEDICAL COMPRESSION STOCKINGS) MISC Apply compression stockings to both legs in morning and remove it at bedtime for leg edema  . ferrous sulfate (SLOW IRON) 160 (50 Fe) MG TBCR SR tablet Take 1 tablet (160 mg total) by mouth daily.  Marland Kitchen L-Methylfolate (DEPLIN) 7.5 MG TABS Take 1 tablet by mouth daily.   Marland Kitchen levothyroxine (SYNTHROID, LEVOTHROID) 50 MCG tablet TAKE ONE TABLET BY MOUTH EVERY MORNING ON AN EMPTY STOMACH FOR  THYROID  . lithium carbonate 150 MG capsule Take 150 mg by mouth daily.  Marland Kitchen loperamide (IMODIUM A-D) 2 MG tablet 1/2 to 1 tablet daily as needed to control diarrhea if you have 3 or more loose stool.  Marland Kitchen loratadine (CLARITIN) 10 MG tablet Take 10 mg by mouth daily. for runny nose x 48 hours. If NOT effective, notify MD.  . metoprolol succinate (TOPROL-XL) 25 MG 24 hr tablet Take 0.5 tablets (12.5 mg total) by mouth daily.  Marland Kitchen omeprazole (PRILOSEC) 20 MG capsule Take 1 capsule (20 mg total) by mouth daily.  . paliperidone (INVEGA) 3 MG 24 hr tablet Take 3 mg by mouth daily.  . Psyllium (METAMUCIL FIBER PO) 1 tablespoon by mouth once a morning. mix with water or juice  . simvastatin (ZOCOR) 5 MG tablet TAKE ONE TABLET BY MOUTH EVERY NIGHT AT BEDTIME  . Vitamin D, Ergocalciferol, (DRISDOL) 50000 units CAPS capsule Take 1 capsule (50,000 Units total) by mouth every 7 (seven) days.  . [DISCONTINUED] aspirin EC 81 MG tablet Take 81 mg by mouth daily.   No facility-administered encounter medications on file as of 11/05/2018.     Review of Systems  Constitutional: Negative for appetite change, chills, fatigue and fever.  HENT: Positive for hearing loss and rhinorrhea. Negative for congestion, postnasal drip, sinus pressure, sinus pain, sneezing, sore throat and trouble swallowing.   Eyes: Negative for discharge, redness and itching.  Respiratory: Negative for cough, chest tightness, shortness of breath and wheezing.   Cardiovascular: Negative for chest pain, palpitations and leg swelling.  Gastrointestinal: Negative for abdominal distention, abdominal pain, constipation, diarrhea, nausea and vomiting.  Genitourinary: Negative for dysuria, flank pain, frequency and urgency.  Musculoskeletal: Positive for gait problem.  Skin: Negative for color change, pallor and rash.  Neurological: Negative for dizziness, light-headedness and headaches.  Psychiatric/Behavioral: Negative for agitation and sleep  disturbance. The patient is not nervous/anxious.     Immunization History  Administered Date(s) Administered  . Influenza,inj,Quad PF,6+ Mos 08/27/2018  . Influenza-Unspecified 08/09/2009, 08/28/2010, 09/11/2011, 09/09/2012, 08/27/2013, 11/26/2013, 09/07/2014, 08/27/2015, 08/08/2016  . Pneumococcal Conjugate-13 06/10/2014  . Pneumococcal Polysaccharide-23 01/16/2010, 08/28/2010, 09/11/2011  . Pneumococcal-Unspecified 08/27/2015  . Td 08/03/2008  . Zoster 01/16/2010, 07/30/2011   Pertinent  Health Maintenance Due  Topic Date Due  . DEXA SCAN  10/04/1990  . INFLUENZA VACCINE  Completed  . PNA vac Low Risk Adult  Completed   Fall Risk  08/13/2018 07/16/2018 01/17/2018 11/07/2016 07/10/2016  Falls in the past year? Yes Yes Yes No Yes  Number falls in past yr: 1 2 or more 2 or more - 1  Injury with Fall? No Yes Yes - Yes  Comment - Patient has a bruise to her right forearm and shoulder.  broken ribs - -  Risk Factor Category  - High Fall Risk - - -  Risk for fall due to : - Impaired balance/gait;History of fall(s) - - -  Follow up - Follow up appointment - - -  Vitals:   11/05/18 1044  BP: (!) 138/45  Pulse: 81  Resp: 20  Temp: 98.6 F (37 C)  TempSrc: Oral  SpO2: 98%  Weight: 139 lb (63 kg)  Height: 4\' 8"  (1.422 m)   Body mass index is 31.16 kg/m. Physical Exam  Constitutional: She is oriented to person, place, and time. She appears well-developed and well-nourished. No distress.  HENT:  Head: Normocephalic.  Right Ear: External ear normal.  Left Ear: External ear normal.  Nose: Rhinorrhea present. No mucosal edema or sinus tenderness.  Mouth/Throat: Oropharynx is clear and moist. No oropharyngeal exudate.  Eyes: Pupils are equal, round, and reactive to light. Conjunctivae and EOM are normal. Right eye exhibits no discharge. Left eye exhibits no discharge. No scleral icterus.  Neck: Normal range of motion. No JVD present. No thyromegaly present.  Cardiovascular: Normal  rate, regular rhythm, normal heart sounds and intact distal pulses. Exam reveals no gallop and no friction rub.  No murmur heard. Pulmonary/Chest: Effort normal and breath sounds normal. No respiratory distress. She has no wheezes. She has no rales.  Abdominal: Soft. Bowel sounds are normal. She exhibits no distension and no mass. There is no tenderness. There is no rebound and no guarding.  Musculoskeletal: Normal range of motion. She exhibits no edema or tenderness.  Lymphadenopathy:    She has no cervical adenopathy.  Neurological: She is oriented to person, place, and time.  Skin: Skin is warm and dry. No rash noted. No erythema. No pallor.  Psychiatric: She has a normal mood and affect. Her speech is normal and behavior is normal. Judgment and thought content normal.  Nursing note and vitals reviewed.   Labs reviewed: Recent Labs    03/13/18 0740 07/11/18 2243 07/17/18 0000 08/01/18 0000  NA 140 142 142 140  K 4.1 4.3 5.1 4.8  CL 111* 108 110 111*  CO2 20  --  22 22  GLUCOSE 104* 95 104* 102*  BUN 40* 26* 33* 38*  CREATININE 1.98* 1.80* 1.92* 2.12*  CALCIUM 9.3  --  9.2 9.6   Recent Labs    02/24/18 0000 07/17/18 0000  AST 17 18  ALT 15 19  BILITOT 0.3 0.4  PROT 6.2 6.0*   Recent Labs    02/24/18 0000 07/11/18 2243 07/17/18 0000  WBC 8.2  --  8.0  NEUTROABS  --   --  6,072  HGB 10.5* 11.6* 10.7*  HCT 32.1* 34.0* 32.6*  MCV 95.5  --  95.6  PLT 315  --  325   Lab Results  Component Value Date   TSH 2.65 07/17/2018   Lab Results  Component Value Date   HGBA1C 5.3 01/31/2018   Lab Results  Component Value Date   CHOL 152 07/17/2018   HDL 68 07/17/2018   LDLCALC 66 07/17/2018   TRIG 94 07/17/2018   CHOLHDL 2.2 07/17/2018    Significant Diagnostic Results in last 30 days:  No results found.  Assessment/Plan 1. Unsteady gait Ambulates with walker status post fall.No weakness noted.continue to monitor.will order PT/OT if needed.   2.  Rhinorrhea Afebrile.Lungs CTA  Bilateral.Loratidine 10 mg tablet one by mouth twice daily x 14 days.encouraged fluid intake. Monitor vital signs every shift x 5 days.   3. Fall at nursing home, initial encounter Status post fall 11/05/2018 early in the morning.No acute injuries.check CBC/diff,BMP 11/06/2018 rule out infectious or metabolic etiologies.     Family/ staff Communication: Reviewed plan of care with patient and facility Nurse  supervisor  Labs/tests ordered: CBC/diff,BMP 11/06/2018  Sandrea Hughs, NP

## 2018-11-06 LAB — CBC AND DIFFERENTIAL
HEMATOCRIT: 33 — AB (ref 36–46)
Hemoglobin: 11 — AB (ref 12.0–16.0)
Platelets: 308 (ref 150–399)
WBC: 10.1

## 2018-11-06 LAB — BASIC METABOLIC PANEL
BUN: 32 — AB (ref 4–21)
Creatinine: 1.8 — AB (ref 0.5–1.1)
GLUCOSE: 100
Potassium: 4.5 (ref 3.4–5.3)
SODIUM: 142 (ref 137–147)

## 2018-11-17 ENCOUNTER — Telehealth: Payer: Self-pay | Admitting: *Deleted

## 2018-11-17 DIAGNOSIS — D638 Anemia in other chronic diseases classified elsewhere: Secondary | ICD-10-CM

## 2018-11-17 DIAGNOSIS — E034 Atrophy of thyroid (acquired): Secondary | ICD-10-CM

## 2018-11-17 DIAGNOSIS — I10 Essential (primary) hypertension: Secondary | ICD-10-CM

## 2018-11-17 DIAGNOSIS — N183 Chronic kidney disease, stage 3 unspecified: Secondary | ICD-10-CM

## 2018-11-17 NOTE — Telephone Encounter (Signed)
Lynn Hayes with [facility] called and stated that she received a written order form for blood work for patient but it was not filled out completely. Needs form to be filled out. Patient was not able to get her blood work done.   Lynn Hayes [facility]) [phone]

## 2018-11-18 NOTE — Telephone Encounter (Signed)
Called left message on machine regarding the missed labs, informed Ms Lynn Hayes that patient can have labs drawn on Thursday. I will check back with Ms Lynn Hayes on Thursday morning to see if patient was able to get labs drawn.

## 2018-11-20 LAB — TSH: TSH: 2.44 (ref 0.41–5.90)

## 2018-11-20 LAB — CBC AND DIFFERENTIAL
HCT: 33 — AB (ref 36–46)
HEMATOCRIT: 33 — AB (ref 36–46)
Hemoglobin: 10.7 — AB (ref 12.0–16.0)
Platelets: 322 (ref 150–399)
WBC: 9.2

## 2018-11-20 LAB — BASIC METABOLIC PANEL
BUN: 30 — AB (ref 4–21)
Creatinine: 1.7 — AB (ref <=1.1)
Glucose: 95
Sodium: 141 (ref 137–147)

## 2018-11-20 NOTE — Telephone Encounter (Signed)
Patient has been moved to healthcare, will speak with Elmyra Ricks (ext. [phone]) tomorrow to see if they still need a order for her labs.

## 2018-11-21 NOTE — Telephone Encounter (Signed)
Spoke with Elmyra Ricks at [facility] regarding this missed lab work, she stated that this has been resolved she received orders from Dr. Sabra Heck.

## 2018-11-24 ENCOUNTER — Other Ambulatory Visit: Payer: Medicare Other

## 2018-11-25 ENCOUNTER — Encounter: Payer: Self-pay | Admitting: Nurse Practitioner

## 2018-11-27 ENCOUNTER — Encounter: Payer: Self-pay | Admitting: Family

## 2018-12-02 ENCOUNTER — Encounter: Payer: Self-pay | Admitting: Family

## 2018-12-03 ENCOUNTER — Encounter: Payer: Medicare Other | Admitting: Family Medicine

## 2018-12-24 ENCOUNTER — Encounter: Payer: Self-pay | Admitting: Nurse Practitioner

## 2018-12-24 ENCOUNTER — Non-Acute Institutional Stay: Payer: Medicare Other | Admitting: Nurse Practitioner

## 2018-12-24 DIAGNOSIS — Z7189 Other specified counseling: Secondary | ICD-10-CM

## 2018-12-24 DIAGNOSIS — F319 Bipolar disorder, unspecified: Secondary | ICD-10-CM | POA: Diagnosis not present

## 2018-12-24 DIAGNOSIS — G3184 Mild cognitive impairment, so stated: Secondary | ICD-10-CM

## 2018-12-24 NOTE — Progress Notes (Addendum)
Location:  [facility] Room Number: 20 Place of Service:  ALF [phone]) Provider:  Marlana Latus  NP  [name], NP  Patient Care Team: Geordie Nooney X, NP as PCP - General (Nurse Practitioner) Lorretta Harp, MD as Consulting Physician (Cardiology) Aquilla Hacker, MD as Referring Physician (Psychiatry) Irene Shipper, MD as Consulting Physician (Gastroenterology) Newton Pigg, MD as Consulting Physician (Obstetrics and Gynecology)  Extended Emergency Contact Information Primary Emergency Contact: Hobbs,Diane Address: [address]          [place], [place] [address] Lynn Hayes of [place] Phone: [phone] Mobile Phone: [phone] Relation: Daughter  Code Status:  Full Code Goals of care: Advanced Directive information Advanced Directives 12/24/2018  Does Patient Have a Medical Advance Directive? Yes  Type of Advance Directive [place]  Does patient want to make changes to medical advance directive? No - Patient declined  Copy of [place] in Chart? Yes - validated most recent copy scanned in chart (See row information)  Would patient like information on creating a medical advance directive? -     Chief Complaint  Patient presents with  . Acute Visit    ACP Meeting- MOST and DNR forms    HPI:  Pt is a 85 y.o. female seen today for an acute visit for goals of care discussion.  Hx of polar disorder, on  Invega 3mg  qd, Lithium 150mg  qd, Duloxetine 60mg  qd, Bupropion 450mg  qd. She resides in [facility] for safety and care assistance.    Past Medical History:  Diagnosis Date  . Anxiety and depression   . Aortic valve disease   . BBB (bundle branch block)    left, chronic  . Bipolar 1 disorder ([place])   . CKD (chronic kidney disease), stage III ([place])   . GERD (gastroesophageal reflux disease)   . Herpes zoster   . Hiatal hernia   . Hyperlipidemia   . Hypertension   . Hypertensive CKD (chronic kidney  disease)   . Hypothyroid   . Osteoarthritis   . Osteopenia   . Renal artery stenosis (HCC)    left   Past Surgical History:  Procedure Laterality Date  . CARDIAC CATHETERIZATION  03/21/04   essentially normal coronary arteries with moderate decrease in left ventricular function, 80% ostial left renal artery stenosis  . COLONOSCOPY  11/11/2006   Dr. Scarlette Shorts , diverticulosis, colon polyps  . [place]  . PANENDOSCOPY  11/11/2006   Dr. Scarlette Shorts, esophageal stricture s/p balloon dilation, GERD, Zenker's Diverticulum    No Known Allergies  Outpatient Encounter Medications as of 12/24/2018  Medication Sig  . aspirin 81 MG chewable tablet Chew 81 mg by mouth daily.  Marland Kitchen buPROPion (WELLBUTRIN XL) 150 MG 24 hr tablet Take 450 mg by mouth daily. 3 tabs  . DULoxetine (CYMBALTA) 60 MG capsule Take 60 mg by mouth at bedtime.   Water engineer Bandages & Supports (MEDICAL COMPRESSION STOCKINGS) MISC Apply compression stockings to both legs in morning and remove it at bedtime for leg edema  . ferrous sulfate (SLOW IRON) 160 (50 Fe) MG TBCR SR tablet Take 1 tablet (160 mg total) by mouth daily.  Marland Kitchen L-Methylfolate (DEPLIN) 7.5 MG TABS Take 1 tablet by mouth daily.   Marland Kitchen levothyroxine (SYNTHROID, LEVOTHROID) 50 MCG tablet TAKE ONE TABLET BY MOUTH EVERY MORNING ON AN EMPTY STOMACH FOR THYROID  . lithium carbonate 150 MG capsule Take 150 mg by mouth  daily.  . loperamide (IMODIUM A-D) 2 MG tablet 1/2 to 1 tablet daily as needed to control diarrhea if you have 3 or more loose stool.  . metoprolol succinate (TOPROL-XL) 25 MG 24 hr tablet Take 0.5 tablets (12.5 mg total) by mouth daily.  Marland Kitchen omeprazole (PRILOSEC) 20 MG capsule Take 1 capsule (20 mg total) by mouth daily.  . paliperidone (INVEGA) 3 MG 24 hr tablet Take 3 mg by mouth daily.  . Psyllium (METAMUCIL FIBER PO) 1 tablespoon by mouth once a morning. mix with water or juice  . simvastatin (ZOCOR) 5 MG tablet TAKE ONE TABLET BY MOUTH EVERY  NIGHT AT BEDTIME  . Vitamin D, Ergocalciferol, (DRISDOL) 50000 units CAPS capsule Take 1 capsule (50,000 Units total) by mouth every 7 (seven) days.  . [DISCONTINUED] loratadine (CLARITIN) 10 MG tablet Take 10 mg by mouth daily. for runny nose x 48 hours. If NOT effective, notify MD.   No facility-administered encounter medications on file as of 12/24/2018.     Review of Systems  Constitutional: Negative for activity change, appetite change, chills, diaphoresis, fatigue and fever.  HENT: Positive for hearing loss. Negative for congestion and voice change.   Eyes: Positive for visual disturbance.       Low vision  Respiratory: Negative for cough, shortness of breath and wheezing.   Cardiovascular: Positive for leg swelling. Negative for chest pain and palpitations.  Gastrointestinal: Negative for abdominal distention, abdominal pain, constipation, diarrhea, nausea and vomiting.  Genitourinary: Negative for difficulty urinating, dysuria and urgency.  Musculoskeletal: Positive for gait problem.  Skin: Negative for color change.  Neurological: Negative for dizziness, speech difficulty, weakness and headaches.       Memory lapses.   Psychiatric/Behavioral: Negative for agitation, behavioral problems, hallucinations and sleep disturbance. The patient is not nervous/anxious.     Immunization History  Administered Date(s) Administered  . Influenza,inj,Quad PF,6+ Mos 08/27/2018  . Influenza-Unspecified 08/09/2009, 08/28/2010, 09/11/2011, 09/09/2012, 08/27/2013, 11/26/2013, 09/07/2014, 08/27/2015, 08/08/2016  . Pneumococcal Conjugate-13 06/10/2014  . Pneumococcal Polysaccharide-23 01/16/2010, 08/28/2010, 09/11/2011  . Pneumococcal-Unspecified 08/27/2015  . Td 08/03/2008  . Zoster 01/16/2010, 07/30/2011   Pertinent  Health Maintenance Due  Topic Date Due  . DEXA SCAN  10/04/1990  . INFLUENZA VACCINE  Completed  . PNA vac Low Risk Adult  Completed   Fall Risk  08/13/2018 07/16/2018 01/17/2018  11/07/2016 07/10/2016  Falls in the past year? Yes Yes Yes No Yes  Number falls in past yr: 1 2 or more 2 or more - 1  Injury with Fall? No Yes Yes - Yes  Comment - Patient has a bruise to her right forearm and shoulder.  broken ribs - -  Risk Factor Category  - High Fall Risk - - -  Risk for fall due to : - Impaired balance/gait;History of fall(s) - - -  Follow up - Follow up appointment - - -   Functional Status Survey:    Vitals:   12/24/18 1243  BP: 140/74  Pulse: 74  Resp: 20  Temp: 97.7 F (36.5 C)  SpO2: 95%  Weight: 139 lb (63 kg)  Height: 4\' 8"  (1.422 m)   Body mass index is 31.16 kg/m. Physical Exam Constitutional:      General: She is not in acute distress.    Appearance: Normal appearance. She is not ill-appearing, toxic-appearing or diaphoretic.  HENT:     Head: Normocephalic and atraumatic.     Nose: Nose normal.     Mouth/Throat:  Mouth: Mucous membranes are moist.  Eyes:     Extraocular Movements: Extraocular movements intact.     Pupils: Pupils are equal, round, and reactive to light.  Neck:     Musculoskeletal: Normal range of motion and neck supple.  Cardiovascular:     Rate and Rhythm: Normal rate and regular rhythm.     Heart sounds: No murmur.  Pulmonary:     Breath sounds: No wheezing, rhonchi or rales.  Abdominal:     General: There is no distension.     Palpations: Abdomen is soft.     Tenderness: There is no abdominal tenderness. There is no guarding or rebound.  Musculoskeletal:     Right lower leg: Edema present.     Left lower leg: Edema present.     Comments: Trace edema R+L ankle/foot. Ambulates with walker.   Skin:    General: Skin is warm and dry.     Coloration: Skin is not pale.     Findings: No erythema or rash.  Neurological:     General: No focal deficit present.     Mental Status: She is alert. Mental status is at baseline.     Cranial Nerves: No cranial nerve deficit.     Motor: No weakness.     Coordination:  Coordination normal.     Gait: Gait abnormal.     Comments: Oriented to person and place.   Psychiatric:        Mood and Affect: Mood normal.        Behavior: Behavior normal.     Labs reviewed: Recent Labs    03/13/18 0740 07/11/18 2243 07/17/18 0000 08/01/18 0000 11/06/18 11/20/18  NA 140 142 142 140 142 141  K 4.1 4.3 5.1 4.8 4.5  --   CL 111* 108 110 111*  --   --   CO2 20  --  22 22  --   --   GLUCOSE 104* 95 104* 102*  --   --   BUN 40* 26* 33* 38* 32* 30*  CREATININE 1.98* 1.80* 1.92* 2.12* 1.8* 1.7*  CALCIUM 9.3  --  9.2 9.6  --   --    Recent Labs    02/24/18 0000 07/17/18 0000  AST 17 18  ALT 15 19  BILITOT 0.3 0.4  PROT 6.2 6.0*   Recent Labs    02/24/18 0000  07/17/18 0000 11/06/18 11/20/18  WBC 8.2  --  8.0 10.1 9.2  NEUTROABS  --   --  6,072  --   --   HGB 10.5*   < > 10.7* 11.0* 10.7*  HCT 32.1*   < > 32.6* 33* 33*  33*  MCV 95.5  --  95.6  --   --   PLT 315  --  325 308 322   < > = values in this interval not displayed.   Lab Results  Component Value Date   TSH 2.44 11/20/2018   Lab Results  Component Value Date   HGBA1C 5.3 01/31/2018   Lab Results  Component Value Date   CHOL 152 07/17/2018   HDL 68 07/17/2018   LDLCALC 66 07/17/2018   TRIG 94 07/17/2018   CHOLHDL 2.2 07/17/2018    Significant Diagnostic Results in last 30 days:  No results found.  Assessment/Plan Counseling regarding advanced care planning and goals of care Goals of care discussion: reviewed goals of care with the patient, the patient's daughter-Lynn Hayes. HPOA document is on file. The patient has  living will but not on file. Social Network engineer also present. Went over and filled out DNR and MOST for between 1:40pm-2:10pm. The patient would like to be DNR when the patient has no pulse and is not breathing. She desires to be transferred to hospital if indicated with limited additional intervention, avoid intensive care in case of medical illness. She  agrees to antibiotics and IVF if indicated. No feeding tube if oral fluid and nutrition unfeasible physically. Forms were signed by the patient, the patient's daughter-Lynn Hayes, social worker, and myself. Copies made for chart and family.   Bipolar disorder ([place]) Her mood is stable, continue Invega 3mg  qd, Lithium 150mg  qd, Duloxetine 60mg  qd, Bupropion 450mg  qd.   Mild cognitive impairment Continue SNF [facility] for safety and care assistance.      Family/ staff Communication: plan of care reviewed with the patient, the patient's daughter Levander Campion HPOA, social worker, and Camera operator.   Labs/tests ordered:  none  Time spend 25 minutes.

## 2018-12-24 NOTE — Assessment & Plan Note (Signed)
Continue SNF [facility] for safety and care assistance.  

## 2018-12-24 NOTE — Assessment & Plan Note (Signed)
Goals of care discussion: reviewed goals of care with the patient, the patient's daughter-Dianne-HPOA. HPOA document is on file. The patient has living will but not on file. Social Network engineer also present. Went over and filled out DNR and MOST for between 1:40pm-2:10pm. The patient would like to be DNR when the patient has no pulse and is not breathing. She desires to be transferred to hospital if indicated with limited additional intervention, avoid intensive care in case of medical illness. She agrees to antibiotics and IVF if indicated. No feeding tube if oral fluid and nutrition unfeasible physically. Forms were signed by the patient, the patient's daughter-Dianne-HPOA, social worker, and myself. Copies made for chart and family.

## 2018-12-24 NOTE — Assessment & Plan Note (Signed)
Her mood is stable, continue Invega 3mg  qd, Lithium 150mg  qd, Duloxetine 60mg  qd, Bupropion 450mg  qd.

## 2018-12-26 ENCOUNTER — Encounter: Payer: Self-pay | Admitting: Nurse Practitioner

## 2019-01-02 ENCOUNTER — Non-Acute Institutional Stay: Payer: Medicare Other | Admitting: Nurse Practitioner

## 2019-01-02 ENCOUNTER — Encounter: Payer: Self-pay | Admitting: Nurse Practitioner

## 2019-01-02 DIAGNOSIS — N183 Chronic kidney disease, stage 3 unspecified: Secondary | ICD-10-CM

## 2019-01-02 DIAGNOSIS — E034 Atrophy of thyroid (acquired): Secondary | ICD-10-CM

## 2019-01-02 DIAGNOSIS — I10 Essential (primary) hypertension: Secondary | ICD-10-CM | POA: Diagnosis not present

## 2019-01-02 DIAGNOSIS — K219 Gastro-esophageal reflux disease without esophagitis: Secondary | ICD-10-CM | POA: Diagnosis not present

## 2019-01-02 DIAGNOSIS — D638 Anemia in other chronic diseases classified elsewhere: Secondary | ICD-10-CM

## 2019-01-02 DIAGNOSIS — F319 Bipolar disorder, unspecified: Secondary | ICD-10-CM

## 2019-01-02 NOTE — Progress Notes (Signed)
Location:  [facility] Room Number: 20 Place of Service:  ALF [phone]) Provider:  Marlana Latus  NP  Ludene Stokke X, NP  Patient Care Team: Ronda Kazmi X, NP as PCP - General (Nurse Practitioner) Lorretta Harp, MD as Consulting Physician (Cardiology) Aquilla Hacker, MD as Referring Physician (Psychiatry) Irene Shipper, MD as Consulting Physician (Gastroenterology) Newton Pigg, MD as Consulting Physician (Obstetrics and Gynecology)  Extended Emergency Contact Information Primary Emergency Contact: Hobbs,Diane Address: [address]          [place], [place] [address] [name] of [place] Phone: [phone] Mobile Phone: [phone] Relation: Daughter  Code Status:  DNR Goals of care: Advanced Directive information Advanced Directives 01/02/2019  Does Patient Have a Medical Advance Directive? Yes  Type of Paramedic of [place];Out of facility DNR (pink MOST or yellow form)  Does patient want to make changes to medical advance directive? No - Patient declined  Copy of [place] in Chart? Yes - validated most recent copy scanned in chart (See row information)  Would patient like information on creating a medical advance directive? -  Pre-existing out of facility DNR order (yellow form or pink MOST form) Yellow form placed in chart (order not valid for inpatient use);Pink MOST form placed in chart (order not valid for inpatient use)     Chief Complaint  Patient presents with  . Medical Management of Chronic Issues    HPI:  Pt is a 85 y.o. female seen today for medical management of chronic diseases.     The patient has history of depression, her mood is stable on Invega 3mg  qd, Lithium 150mg  qd, Cymbalta 60mg  qd, Wellbutrin 450mg  qd. Hx of hypothyroidism, on Levothyroxine 60mcg qd, last TSH 2.44 11/20/18. Anemia, on Fe 160mg  qd, last Hgb 10.7 11/20/18. HTN, blood pressure is controlled on Metoprolol 12.5mg   qd. GERD stable on Omeprazole 20mg  qd.    Past Medical History:  Diagnosis Date  . Anxiety and depression   . Aortic valve disease   . BBB (bundle branch block)    left, chronic  . Bipolar 1 disorder ([place])   . CKD (chronic kidney disease), stage III ([place])   . GERD (gastroesophageal reflux disease)   . Herpes zoster   . Hiatal hernia   . Hyperlipidemia   . Hypertension   . Hypertensive CKD (chronic kidney disease)   . Hypothyroid   . Osteoarthritis   . Osteopenia   . Renal artery stenosis (HCC)    left   Past Surgical History:  Procedure Laterality Date  . CARDIAC CATHETERIZATION  03/21/04   essentially normal coronary arteries with moderate decrease in left ventricular function, 80% ostial left renal artery stenosis  . COLONOSCOPY  11/11/2006   Dr. Scarlette Shorts , diverticulosis, colon polyps  . [place]  . PANENDOSCOPY  11/11/2006   Dr. Scarlette Shorts, esophageal stricture s/p balloon dilation, GERD, Zenker's Diverticulum    No Known Allergies  Outpatient Encounter Medications as of 01/02/2019  Medication Sig  . aspirin 81 MG chewable tablet Chew 81 mg by mouth daily.  Marland Kitchen buPROPion (WELLBUTRIN XL) 150 MG 24 hr tablet Take 450 mg by mouth daily. 3 tabs  . DULoxetine (CYMBALTA) 60 MG capsule Take 60 mg by mouth at bedtime.   Water engineer Bandages & Supports (MEDICAL COMPRESSION STOCKINGS) MISC Apply compression stockings to both legs in morning and remove it at bedtime for leg edema  . ferrous  sulfate (SLOW IRON) 160 (50 Fe) MG TBCR SR tablet Take 1 tablet (160 mg total) by mouth daily.  Marland Kitchen L-Methylfolate (DEPLIN) 7.5 MG TABS Take 1 tablet by mouth daily.   Marland Kitchen levothyroxine (SYNTHROID, LEVOTHROID) 50 MCG tablet TAKE ONE TABLET BY MOUTH EVERY MORNING ON AN EMPTY STOMACH FOR THYROID  . lithium carbonate 150 MG capsule Take 150 mg by mouth daily.  Marland Kitchen loperamide (IMODIUM A-D) 2 MG tablet 1/2 to 1 tablet daily as needed to control diarrhea if you have 3 or more loose stool.  .  metoprolol succinate (TOPROL-XL) 25 MG 24 hr tablet Take 0.5 tablets (12.5 mg total) by mouth daily.  Marland Kitchen omeprazole (PRILOSEC) 20 MG capsule Take 1 capsule (20 mg total) by mouth daily.  . paliperidone (INVEGA) 3 MG 24 hr tablet Take 3 mg by mouth daily.  . Psyllium (METAMUCIL FIBER PO) 1 tablespoon by mouth once a morning. mix with water or juice  . simvastatin (ZOCOR) 5 MG tablet TAKE ONE TABLET BY MOUTH EVERY NIGHT AT BEDTIME  . Vitamin D, Ergocalciferol, (DRISDOL) 50000 units CAPS capsule Take 1 capsule (50,000 Units total) by mouth every 7 (seven) days.   No facility-administered encounter medications on file as of 01/02/2019.    ROS was provided with assistance of staff Review of Systems  Constitutional: Negative for activity change, appetite change, chills, diaphoresis, fatigue, fever and unexpected weight change.  HENT: Positive for hearing loss. Negative for congestion and voice change.   Eyes: Positive for visual disturbance.  Respiratory: Negative for cough, shortness of breath and wheezing.   Cardiovascular: Positive for leg swelling. Negative for chest pain and palpitations.  Gastrointestinal: Negative for abdominal distention, abdominal pain, constipation, diarrhea, nausea and vomiting.  Genitourinary: Negative for difficulty urinating, dysuria and urgency.  Musculoskeletal: Positive for arthralgias and gait problem.  Skin: Negative for color change and pallor.  Neurological: Negative for dizziness, speech difficulty, weakness and headaches.       Memory lapses.   Psychiatric/Behavioral: Negative for agitation, behavioral problems, hallucinations and sleep disturbance. The patient is not nervous/anxious.     Immunization History  Administered Date(s) Administered  . Influenza,inj,Quad PF,6+ Mos 08/27/2018  . Influenza-Unspecified 08/09/2009, 08/28/2010, 09/11/2011, 09/09/2012, 08/27/2013, 11/26/2013, 09/07/2014, 08/27/2015, 08/08/2016  . Pneumococcal Conjugate-13 06/10/2014    . Pneumococcal Polysaccharide-23 01/16/2010, 08/28/2010, 09/11/2011  . Pneumococcal-Unspecified 08/27/2015  . Td 08/03/2008  . Zoster 01/16/2010, 07/30/2011   Pertinent  Health Maintenance Due  Topic Date Due  . DEXA SCAN  10/04/1990  . INFLUENZA VACCINE  Completed  . PNA vac Low Risk Adult  Completed   Fall Risk  08/13/2018 07/16/2018 01/17/2018 11/07/2016 07/10/2016  Falls in the past year? Yes Yes Yes No Yes  Number falls in past yr: 1 2 or more 2 or more - 1  Injury with Fall? No Yes Yes - Yes  Comment - Patient has a bruise to her right forearm and shoulder.  broken ribs - -  Risk Factor Category  - High Fall Risk - - -  Risk for fall due to : - Impaired balance/gait;History of fall(s) - - -  Follow up - Follow up appointment - - -   Functional Status Survey:    Vitals:   01/02/19 1117  BP: 140/68  Pulse: 68  Resp: 20  Temp: 98.1 F (36.7 C)  SpO2: 98%  Weight: 137 lb 6.4 oz (62.3 kg)  Height: 4\' 8"  (1.422 m)   Body mass index is 30.8 kg/m. Physical Exam Constitutional:  General: She is not in acute distress.    Appearance: Normal appearance. She is obese. She is not ill-appearing.  HENT:     Head: Normocephalic.     Nose: Nose normal.     Mouth/Throat:     Mouth: Mucous membranes are moist.  Eyes:     Extraocular Movements: Extraocular movements intact.     Pupils: Pupils are equal, round, and reactive to light.  Neck:     Musculoskeletal: Normal range of motion and neck supple.  Cardiovascular:     Rate and Rhythm: Normal rate and regular rhythm.     Heart sounds: No murmur.  Pulmonary:     Effort: Pulmonary effort is normal.     Breath sounds: No wheezing, rhonchi or rales.  Abdominal:     General: There is no distension.     Palpations: Abdomen is soft.     Tenderness: There is no abdominal tenderness. There is no guarding or rebound.  Musculoskeletal:     Right lower leg: Edema present.     Left lower leg: Edema present.     Comments: Trace  edema BLE. Ambulates with walker.   Skin:    General: Skin is warm and dry.     Comments: A sebaceous cyst mid chest.    Neurological:     General: No focal deficit present.     Mental Status: She is alert. Mental status is at baseline.     Cranial Nerves: No cranial nerve deficit.     Motor: No weakness.     Coordination: Coordination normal.     Gait: Gait abnormal.     Comments: Oriented to person and place.   Psychiatric:        Mood and Affect: Mood normal.        Behavior: Behavior normal.     Labs reviewed: Recent Labs    03/13/18 0740 07/11/18 2243 07/17/18 0000 08/01/18 0000 11/06/18 11/20/18  NA 140 142 142 140 142 141  K 4.1 4.3 5.1 4.8 4.5  --   CL 111* 108 110 111*  --   --   CO2 20  --  22 22  --   --   GLUCOSE 104* 95 104* 102*  --   --   BUN 40* 26* 33* 38* 32* 30*  CREATININE 1.98* 1.80* 1.92* 2.12* 1.8* 1.7*  CALCIUM 9.3  --  9.2 9.6  --   --    Recent Labs    02/24/18 0000 07/17/18 0000  AST 17 18  ALT 15 19  BILITOT 0.3 0.4  PROT 6.2 6.0*   Recent Labs    02/24/18 0000  07/17/18 0000 11/06/18 11/20/18  WBC 8.2  --  8.0 10.1 9.2  NEUTROABS  --   --  6,072  --   --   HGB 10.5*   < > 10.7* 11.0* 10.7*  HCT 32.1*   < > 32.6* 33* 33*  33*  MCV 95.5  --  95.6  --   --   PLT 315  --  325 308 322   < > = values in this interval not displayed.   Lab Results  Component Value Date   TSH 2.44 11/20/2018   Lab Results  Component Value Date   HGBA1C 5.3 01/31/2018   Lab Results  Component Value Date   CHOL 152 07/17/2018   HDL 68 07/17/2018   LDLCALC 66 07/17/2018   TRIG 94 07/17/2018   CHOLHDL 2.2 07/17/2018  Significant Diagnostic Results in last 30 days:  No results found.  Assessment/Plan Essential hypertension Blood pressure is in control, continue Metoprolol 12.5mg  qd.   Gastroesophageal reflux disease without esophagitis Stable, continue Omeprazole 20mg  qd.   Hypothyroidism Stable, continue Levothyroxine 44mcg qd, last  TSH wnl 2.44 11/20/18  CKD (chronic kidney disease) stage 3, GFR 30-59 ml/min (HCC) Chronic, baseline creat is around 2, last creat 1.7 11/20/18  Anemia, chronic disease Stable, continue Fe 160mg  qd, last Hgb 10.7, may consider future CBC with Fe, Ferritin, Vit B12, Folate.   Bipolar disorder ([place]) her mood is stable, continue Invega 3mg  qd, Lithium 150mg  qd, Cymbalta 60mg  qd, Wellbutrin 450mg  qd.      Family/ staff Communication:plan of care reviewed with the patient and charge nurse.   Labs/tests ordered: none  Time spend 25 minutes.

## 2019-01-04 ENCOUNTER — Encounter: Payer: Self-pay | Admitting: Nurse Practitioner

## 2019-01-04 NOTE — Assessment & Plan Note (Signed)
Stable, continue Fe 160mg  qd, last Hgb 10.7, may consider future CBC with Fe, Ferritin, Vit B12, Folate.

## 2019-01-04 NOTE — Assessment & Plan Note (Signed)
Stable, continue Levothyroxine 50mcg qd, last TSH wnl 2.44 11/20/18

## 2019-01-04 NOTE — Assessment & Plan Note (Signed)
Stable, continue Omeprazole 20mg qd.  

## 2019-01-04 NOTE — Assessment & Plan Note (Signed)
Chronic, baseline creat is around 2, last creat 1.7 11/20/18

## 2019-01-04 NOTE — Assessment & Plan Note (Signed)
Blood pressure is in control, continue Metoprolol 12.5mg  qd.

## 2019-01-04 NOTE — Assessment & Plan Note (Signed)
her mood is stable, continue Invega 3mg  qd, Lithium 150mg  qd, Cymbalta 60mg  qd, Wellbutrin 450mg  qd.

## 2019-03-03 ENCOUNTER — Non-Acute Institutional Stay: Payer: Medicare Other | Admitting: Family

## 2019-03-03 ENCOUNTER — Encounter: Payer: Self-pay | Admitting: Family

## 2019-03-03 DIAGNOSIS — W19XXXA Unspecified fall, initial encounter: Secondary | ICD-10-CM | POA: Diagnosis not present

## 2019-03-03 DIAGNOSIS — R2681 Unsteadiness on feet: Secondary | ICD-10-CM

## 2019-03-03 DIAGNOSIS — Y92129 Unspecified place in nursing home as the place of occurrence of the external cause: Secondary | ICD-10-CM | POA: Diagnosis not present

## 2019-03-03 NOTE — Progress Notes (Signed)
Location:  [facility] Room Number: 20 Place of Service:  ALF [phone]) Provider: Kyair Ditommaso FNP-C  Mast, Man X, NP  Patient Care Team: Mast, Man X, NP as PCP - General (Nurse Practitioner) Lorretta Harp, MD as Consulting Physician (Cardiology) Aquilla Hacker, MD as Referring Physician (Psychiatry) Irene Shipper, MD as Consulting Physician (Gastroenterology) Newton Pigg, MD as Consulting Physician (Obstetrics and Gynecology)  Extended Emergency Contact Information Primary Emergency Contact: Hobbs,Diane Address: [address]          [place], [place] [address] [name] of [place] Phone: [phone] Mobile Phone: [phone] Relation: Daughter  Code Status:  DNR Goals of care: Advanced Directive information Advanced Directives 01/02/2019  Does Patient Have a Medical Advance Directive? Yes  Type of Paramedic of [place];Out of facility DNR (pink MOST or yellow form)  Does patient want to make changes to medical advance directive? No - Patient declined  Copy of [place] in Chart? Yes - validated most recent copy scanned in chart (See row information)  Would patient like information on creating a medical advance directive? -  Pre-existing out of facility DNR order (yellow form or pink MOST form) Yellow form placed in chart (order not valid for inpatient use);Pink MOST form placed in chart (order not valid for inpatient use)     Chief Complaint  Patient presents with   Acute Visit    Fall     HPI:  Pt is a 85 y.o. female seen today at [facility] for an acute visit for evaluation of fall episode.she is seen in her room today with facility Nursing staff  Present at bedside.Nurse reported via SBAR that patient was observed on the sitting on  Floor in her room during lunch time.Patient states fell over when she was trying bend  to pick up crumbs on the floor.she states hit her nose but denies  hitting her head on the floor.she denies any bruises on her nose or tenderness to her head.Patient has been instructed by nurse to call for assistance whenever she needs assistance with cleaning the floor.she denies any headache,dizziness,blurry vision,nausea or vomiting.also denies any signs or symptoms of urinary tract infections.she continues to work with Speech therapy for dysphagia.of note,she does have cognitive impairment.      Past Medical History:  Diagnosis Date   Anxiety and depression    Aortic valve disease    BBB (bundle branch block)    left, chronic   Bipolar 1 disorder (HCC)    CKD (chronic kidney disease), stage III (HCC)    GERD (gastroesophageal reflux disease)    Herpes zoster    Hiatal hernia    Hyperlipidemia    Hypertension    Hypertensive CKD (chronic kidney disease)    Hypothyroid    Osteoarthritis    Osteopenia    Renal artery stenosis ([place])    left   Past Surgical History:  Procedure Laterality Date   CARDIAC CATHETERIZATION  03/21/04   essentially normal coronary arteries with moderate decrease in left ventricular function, 80% ostial left renal artery stenosis   COLONOSCOPY  11/11/2006   Dr. Scarlette Shorts , diverticulosis, colon polyps   [place]   PANENDOSCOPY  11/11/2006   Dr. Scarlette Shorts, esophageal stricture s/p balloon dilation, GERD, Zenker's Diverticulum    No Known Allergies  Outpatient Encounter Medications as of 03/03/2019  Medication Sig   aspirin 81 MG chewable tablet Chew 81 mg by  mouth daily.   buPROPion (WELLBUTRIN XL) 150 MG 24 hr tablet Take 450 mg by mouth daily. 3 tabs   DULoxetine (CYMBALTA) 60 MG capsule Take 60 mg by mouth at bedtime.    ferrous sulfate (SLOW IRON) 160 (50 Fe) MG TBCR SR tablet Take 1 tablet (160 mg total) by mouth daily.   L-Methylfolate (DEPLIN) 7.5 MG TABS Take 1 tablet by mouth daily.    levothyroxine (SYNTHROID, LEVOTHROID) 50 MCG tablet TAKE ONE TABLET BY MOUTH EVERY  MORNING ON AN EMPTY STOMACH FOR THYROID   lithium carbonate 150 MG capsule Take 150 mg by mouth daily.   loperamide (IMODIUM A-D) 2 MG tablet 1/2 to 1 tablet daily as needed to control diarrhea if you have 3 or more loose stool.   metoprolol succinate (TOPROL-XL) 25 MG 24 hr tablet Take 0.5 tablets (12.5 mg total) by mouth daily.   omeprazole (PRILOSEC) 20 MG capsule Take 1 capsule (20 mg total) by mouth daily.   paliperidone (INVEGA) 3 MG 24 hr tablet Take 3 mg by mouth daily.   Psyllium (METAMUCIL FIBER PO) 1 tablespoon by mouth once a morning. mix with water or juice   simvastatin (ZOCOR) 5 MG tablet TAKE ONE TABLET BY MOUTH EVERY NIGHT AT BEDTIME   Vitamin D, Ergocalciferol, (DRISDOL) 50000 units CAPS capsule Take 1 capsule (50,000 Units total) by mouth every 7 (seven) days.   Elastic Bandages & Supports (MEDICAL COMPRESSION STOCKINGS) MISC Apply compression stockings to both legs in morning and remove it at bedtime for leg edema   No facility-administered encounter medications on file as of 03/03/2019.     Review of Systems  Constitutional: Negative for appetite change, chills, fatigue, fever and unexpected weight change.  HENT: Positive for hearing loss. Negative for congestion, rhinorrhea, sinus pressure, sinus pain, sneezing and sore throat.   Eyes: Negative for discharge, redness and itching.  Respiratory: Negative for cough, chest tightness, shortness of breath and wheezing.   Cardiovascular: Negative for chest pain, palpitations and leg swelling.  Gastrointestinal: Negative for abdominal distention, abdominal pain, constipation, diarrhea, nausea and vomiting.  Genitourinary: Negative for difficulty urinating, dysuria, flank pain, frequency and urgency.  Musculoskeletal: Positive for gait problem.  Skin: Negative for color change, pallor and rash.  Neurological: Negative for dizziness, syncope, light-headedness and headaches.  Psychiatric/Behavioral: Negative for agitation,  confusion and sleep disturbance. The patient is not nervous/anxious.     Immunization History  Administered Date(s) Administered   Influenza,inj,Quad PF,6+ Mos 08/27/2018   Influenza-Unspecified 08/09/2009, 08/28/2010, 09/11/2011, 09/09/2012, 08/27/2013, 11/26/2013, 09/07/2014, 08/27/2015, 08/08/2016   Pneumococcal Conjugate-13 06/10/2014   Pneumococcal Polysaccharide-23 01/16/2010, 08/28/2010, 09/11/2011   Pneumococcal-Unspecified 08/27/2015   Td 08/03/2008   Zoster 01/16/2010, 07/30/2011   Pertinent  Health Maintenance Due  Topic Date Due   DEXA SCAN  10/04/1990   INFLUENZA VACCINE  06/27/2019   PNA vac Low Risk Adult  Completed   Fall Risk  08/13/2018 07/16/2018 01/17/2018 11/07/2016 07/10/2016  Falls in the past year? Yes Yes Yes No Yes  Number falls in past yr: 1 2 or more 2 or more - 1  Injury with Fall? No Yes Yes - Yes  Comment - Patient has a bruise to her right forearm and shoulder.  broken ribs - -  Risk Factor Category  - High Fall Risk - - -  Risk for fall due to : - Impaired balance/gait;History of fall(s) - - -  Follow up - Follow up appointment - - -    Vitals:  03/03/19 1128  BP: (!) 138/44  Pulse: 76  Resp: 20  Temp: 98.2 F (36.8 C)  TempSrc: Oral  SpO2: 96%  Weight: 136 lb 9.6 oz (62 kg)  Height: 4\' 8"  (1.422 m)   Body mass index is 30.63 kg/m. Physical Exam Vitals signs and nursing note reviewed.  Constitutional:      General: She is not in acute distress.    Appearance: She is obese. She is not ill-appearing.  HENT:     Head: Normocephalic.     Right Ear: Tympanic membrane, ear canal and external ear normal. There is no impacted cerumen.     Left Ear: Tympanic membrane, ear canal and external ear normal. There is no impacted cerumen.     Nose: Nose normal. No congestion or rhinorrhea.     Mouth/Throat:     Mouth: Mucous membranes are moist.     Pharynx: Oropharynx is clear. No oropharyngeal exudate or posterior oropharyngeal  erythema.  Eyes:     General: No scleral icterus.       Right eye: No discharge.        Left eye: No discharge.     Conjunctiva/sclera: Conjunctivae normal.     Pupils: Pupils are equal, round, and reactive to light.  Neck:     Musculoskeletal: Normal range of motion. No neck rigidity or muscular tenderness.     Vascular: No carotid bruit.  Cardiovascular:     Rate and Rhythm: Normal rate and regular rhythm.     Pulses: Normal pulses.     Heart sounds: Normal heart sounds. No murmur. No friction rub. No gallop.   Pulmonary:     Effort: Pulmonary effort is normal. No respiratory distress.     Breath sounds: Normal breath sounds. No wheezing, rhonchi or rales.  Chest:     Chest wall: No tenderness.  Abdominal:     General: Bowel sounds are normal. There is no distension.     Palpations: Abdomen is soft. There is no mass.     Tenderness: There is no abdominal tenderness. There is no right CVA tenderness, left CVA tenderness, guarding or rebound.  Musculoskeletal:        General: No swelling or tenderness.     Right lower leg: No edema.     Left lower leg: No edema.     Comments: Moves x 4 extremities without difficulties.unsteady gait uses walker.   Lymphadenopathy:     Cervical: No cervical adenopathy.  Skin:    General: Skin is warm and dry.     Coloration: Skin is not pale.     Findings: No bruising, erythema or rash.     Comments: Skin intact   Neurological:     Mental Status: She is alert. Mental status is at baseline.     Sensory: No sensory deficit.     Motor: No weakness.     Coordination: Coordination normal.     Gait: Gait abnormal.     Deep Tendon Reflexes: Reflexes normal.  Psychiatric:        Mood and Affect: Mood normal.        Speech: Speech normal.        Behavior: Behavior normal.        Thought Content: Thought content normal.        Judgment: Judgment normal.    Labs reviewed: Recent Labs    03/13/18 0740 07/11/18 2243 07/17/18 0000  08/01/18 0000 11/06/18 11/20/18  NA 140 142 142 140 142  141  K 4.1 4.3 5.1 4.8 4.5  --   CL 111* 108 110 111*  --   --   CO2 20  --  22 22  --   --   GLUCOSE 104* 95 104* 102*  --   --   BUN 40* 26* 33* 38* 32* 30*  CREATININE 1.98* 1.80* 1.92* 2.12* 1.8* 1.7*  CALCIUM 9.3  --  9.2 9.6  --   --    Recent Labs    07/17/18 0000  AST 18  ALT 19  BILITOT 0.4  PROT 6.0*   Recent Labs    07/17/18 0000 11/06/18 11/20/18  WBC 8.0 10.1 9.2  NEUTROABS 6,072  --   --   HGB 10.7* 11.0* 10.7*  HCT 32.6* 33* 33*   33*  MCV 95.6  --   --   PLT 325 308 322   Lab Results  Component Value Date   TSH 2.44 11/20/2018   Lab Results  Component Value Date   HGBA1C 5.3 01/31/2018   Lab Results  Component Value Date   CHOL 152 07/17/2018   HDL 68 07/17/2018   LDLCALC 66 07/17/2018   TRIG 94 07/17/2018   CHOLHDL 2.2 07/17/2018    Significant Diagnostic Results in last 30 days:  No results found.  Assessment/Plan  1. Unsteady gait Ambulates with walker.continue with fall and safety precautions.  2. Fall at nursing home, initial encounter Vital signs reviewed stable.Status post fall in her room while bending over to pick up crumbs on the floor during lunch time.No injuries sustained.Negative exam finding for any bruises or skin tears.encouraged patient to call for assistance instead of bending over to pick stuff from the floor.continue to encourage and remind her to call for assistance.coninue to monitor.   Family/ staff Communication: Reviewed plan of care with patient and facility Nurse supervisor.   Labs/tests ordered: None   Maygan Koeller C Harlow Carrizales, NP

## 2019-03-12 LAB — CBC AND DIFFERENTIAL
HCT: 36 (ref 36–46)
Hemoglobin: 11.7 — AB (ref 12.0–16.0)
Platelets: 320 (ref 150–399)

## 2019-03-12 LAB — BASIC METABOLIC PANEL
BUN: 27 — AB (ref 4–21)
Creatinine: 1.7 — AB (ref 0.5–1.1)
Glucose: 109
Potassium: 4.2 (ref 3.4–5.3)
Sodium: 142 (ref 137–147)

## 2019-03-12 LAB — HEPATIC FUNCTION PANEL
ALT: 16 (ref 7–35)
AST: 16 (ref 13–35)

## 2019-03-18 ENCOUNTER — Encounter: Payer: Self-pay | Admitting: Internal Medicine

## 2019-03-18 ENCOUNTER — Non-Acute Institutional Stay: Payer: Medicare Other | Admitting: Internal Medicine

## 2019-03-18 DIAGNOSIS — E034 Atrophy of thyroid (acquired): Secondary | ICD-10-CM | POA: Diagnosis not present

## 2019-03-18 DIAGNOSIS — N183 Chronic kidney disease, stage 3 unspecified: Secondary | ICD-10-CM

## 2019-03-18 DIAGNOSIS — G3184 Mild cognitive impairment, so stated: Secondary | ICD-10-CM

## 2019-03-18 DIAGNOSIS — E7800 Pure hypercholesterolemia, unspecified: Secondary | ICD-10-CM

## 2019-03-18 DIAGNOSIS — K219 Gastro-esophageal reflux disease without esophagitis: Secondary | ICD-10-CM

## 2019-03-18 DIAGNOSIS — I10 Essential (primary) hypertension: Secondary | ICD-10-CM | POA: Diagnosis not present

## 2019-03-18 DIAGNOSIS — F319 Bipolar disorder, unspecified: Secondary | ICD-10-CM

## 2019-03-18 NOTE — Progress Notes (Signed)
Location:  [facility] Room Number: 20 Place of Service:  ALF (13) Provider:Beverlyann Broxterman L,MD   Mast, Man X, NP  Patient Care Team: Mast, Man X, NP as PCP - General (Nurse Practitioner) Lorretta Harp, MD as Consulting Physician (Cardiology) Aquilla Hacker, MD as Referring Physician (Psychiatry) Irene Shipper, MD as Consulting Physician (Gastroenterology) Newton Pigg, MD as Consulting Physician (Obstetrics and Gynecology)  Extended Emergency Contact Information Primary Emergency Contact: Hobbs,Diane Address: [address]          [place], [place] [address] [name] of [place] Phone: [phone] Mobile Phone: [phone] Relation: Daughter  Code Status:DNR Goals of care: Advanced Directive information Advanced Directives 03/18/2019  Does Patient Have a Medical Advance Directive? Yes  Type of Paramedic of [place];Out of facility DNR (pink MOST or yellow form)  Does patient want to make changes to medical advance directive? No - Patient declined  Copy of [place] in Chart? Yes - validated most recent copy scanned in chart (See row information)  Would patient like information on creating a medical advance directive? -  Pre-existing out of facility DNR order (yellow form or pink MOST form) Yellow form placed in chart (order not valid for inpatient use);Pink MOST form placed in chart (order not valid for inpatient use)     Chief Complaint  Patient presents with  . Acute Visit    Fall    HPI:  Pt is a 85 y.o. female seen today for an acute visit for Fall Patient has a history of hypertension, hypothyroidism, Hyperlipidemia, CKD stage 3, GERD, Anemia mild cognitive impairment, falls And Bipolar Illness follows with Outside psychaitrist  Patient has History of gait impairment and previous falls. Patient was trying to sit on her bed and she said that she slid off the bed . She did not sustain  any kind of injuries. The  nurses wanted me to look at her bed and they are worried that her bed is little high . This is patient's own bed and she has  Resisted  to change it in the past. Patient denied any complaints today.  She is working [employer] therapy  to improve her gait . She denied any fever, chest pain, shortness of breath, cough   Past Medical History:  Diagnosis Date  . Anxiety and depression   . Aortic valve disease   . BBB (bundle branch block)    left, chronic  . Bipolar 1 disorder ([place])   . CKD (chronic kidney disease), stage III ([place])   . GERD (gastroesophageal reflux disease)   . Herpes zoster   . Hiatal hernia   . Hyperlipidemia   . Hypertension   . Hypertensive CKD (chronic kidney disease)   . Hypothyroid   . Osteoarthritis   . Osteopenia   . Renal artery stenosis (HCC)    left   Past Surgical History:  Procedure Laterality Date  . CARDIAC CATHETERIZATION  03/21/04   essentially normal coronary arteries with moderate decrease in left ventricular function, 80% ostial left renal artery stenosis  . COLONOSCOPY  11/11/2006   Dr. Scarlette Shorts , diverticulosis, colon polyps  . [address]  . PANENDOSCOPY  11/11/2006   Dr. Scarlette Shorts, esophageal stricture s/p balloon dilation, GERD, Zenker's Diverticulum    No Known Allergies  Outpatient Encounter Medications as of 03/18/2019  Medication Sig  . aspirin 81 MG chewable tablet Chew 81 mg by mouth daily.  Marland Kitchen buPROPion (  WELLBUTRIN XL) 150 MG 24 hr tablet Take 450 mg by mouth daily. 3 tabs  . DULoxetine (CYMBALTA) 60 MG capsule Take 60 mg by mouth at bedtime.   Water engineer Bandages & Supports (MEDICAL COMPRESSION STOCKINGS) MISC Apply compression stockings to both legs in morning and remove it at bedtime for leg edema  . ferrous sulfate (SLOW IRON) 160 (50 Fe) MG TBCR SR tablet Take 1 tablet (160 mg total) by mouth daily.  Marland Kitchen L-Methylfolate (DEPLIN) 7.5 MG TABS Take 1 tablet by mouth daily.   Marland Kitchen levothyroxine  (SYNTHROID, LEVOTHROID) 50 MCG tablet TAKE ONE TABLET BY MOUTH EVERY MORNING ON AN EMPTY STOMACH FOR THYROID  . lithium carbonate 150 MG capsule Take 150 mg by mouth daily.  Marland Kitchen loperamide (IMODIUM A-D) 2 MG tablet 1/2 to 1 tablet daily as needed to control diarrhea if you have 3 or more loose stool.  . metoprolol succinate (TOPROL-XL) 25 MG 24 hr tablet Take 0.5 tablets (12.5 mg total) by mouth daily.  Marland Kitchen omeprazole (PRILOSEC) 20 MG capsule Take 1 capsule (20 mg total) by mouth daily.  . paliperidone (INVEGA) 3 MG 24 hr tablet Take 3 mg by mouth daily.  . Psyllium (METAMUCIL FIBER PO) 1 tablespoon by mouth once a morning. mix with water or juice  . simvastatin (ZOCOR) 5 MG tablet TAKE ONE TABLET BY MOUTH EVERY NIGHT AT BEDTIME  . Vitamin D, Ergocalciferol, (DRISDOL) 50000 units CAPS capsule Take 1 capsule (50,000 Units total) by mouth every 7 (seven) days.   No facility-administered encounter medications on file as of 03/18/2019.     Review of Systems  Review of Systems  Constitutional: Negative for activity change, appetite change, chills, diaphoresis, fatigue and fever.  HENT: Negative for mouth sores, postnasal drip, rhinorrhea, sinus pain and sore throat.   Respiratory: Negative for apnea, cough, chest tightness, shortness of breath and wheezing.   Cardiovascular: Negative for chest pain, palpitations and leg swelling.  Gastrointestinal: Negative for abdominal distention, abdominal pain, constipation, diarrhea, nausea and vomiting.  Genitourinary: Negative for dysuria and frequency.  Musculoskeletal: Negative for arthralgias, joint swelling and myalgias.  Skin: Negative for rash.  Neurological: Negative for dizziness, syncope, weakness, light-headedness and numbness.  Psychiatric/Behavioral: Negative for behavioral problems, confusion and sleep disturbance.     Immunization History  Administered Date(s) Administered  . Influenza,inj,Quad PF,6+ Mos 08/27/2018  . Influenza-Unspecified  08/09/2009, 08/28/2010, 09/11/2011, 09/09/2012, 08/27/2013, 11/26/2013, 09/07/2014, 08/27/2015, 08/08/2016  . Pneumococcal Conjugate-13 06/10/2014  . Pneumococcal Polysaccharide-23 01/16/2010, 08/28/2010, 09/11/2011  . Pneumococcal-Unspecified 08/27/2015  . Td 08/03/2008  . Zoster 01/16/2010, 07/30/2011   Pertinent  Health Maintenance Due  Topic Date Due  . DEXA SCAN  10/04/1990  . INFLUENZA VACCINE  06/27/2019  . PNA vac Low Risk Adult  Completed   Fall Risk  08/13/2018 07/16/2018 01/17/2018 11/07/2016 07/10/2016  Falls in the past year? Yes Yes Yes No Yes  Number falls in past yr: 1 2 or more 2 or more - 1  Injury with Fall? No Yes Yes - Yes  Comment - Patient has a bruise to her right forearm and shoulder.  broken ribs - -  Risk Factor Category  - High Fall Risk - - -  Risk for fall due to : - Impaired balance/gait;History of fall(s) - - -  Follow up - Follow up appointment - - -   Functional Status Survey:    Vitals:   03/18/19 1020  BP: (!) 147/54  Pulse: 74  Resp: 18  Temp: 98.2 F (  36.8 C)  SpO2: 97%  Weight: 136 lb 9.6 oz (62 kg)  Height: 4\' 8"  (1.422 m)   Body mass index is 30.63 kg/m. Physical Exam Vitals signs reviewed.  HENT:     Head: Normocephalic.     Nose: Nose normal.     Mouth/Throat:     Mouth: Mucous membranes are moist.     Pharynx: Oropharynx is clear.  Eyes:     Pupils: Pupils are equal, round, and reactive to light.  Cardiovascular:     Rate and Rhythm: Normal rate and regular rhythm.     Pulses: Normal pulses.     Heart sounds: Normal heart sounds.  Pulmonary:     Effort: Pulmonary effort is normal.     Breath sounds: Normal breath sounds.  Abdominal:     General: Abdomen is flat. Bowel sounds are normal.     Palpations: Abdomen is soft.  Musculoskeletal:        General: No swelling.  Skin:    General: Skin is warm and dry.  Neurological:     General: No focal deficit present.     Mental Status: She is alert.     Comments: No signs  of any Injury  Psychiatric:        Mood and Affect: Mood normal.        Thought Content: Thought content normal.        Judgment: Judgment normal.     Labs reviewed: Recent Labs    07/11/18 2243  07/17/18 0000 08/01/18 0000 11/06/18 11/20/18 03/12/19  NA 142  --  142 140 142 141 142  K 4.3  --  5.1 4.8 4.5  --  4.2  CL 108  --  110 111*  --   --   --   CO2  --   --  22 22  --   --   --   GLUCOSE 95  --  104* 102*  --   --   --   BUN 26*  --  33* 38* 32* 30* 27*  CREATININE 1.80*   < > 1.92* 2.12* 1.8* 1.7* 1.7*  CALCIUM  --   --  9.2 9.6  --   --   --    < > = values in this interval not displayed.   Recent Labs    07/17/18 0000 03/12/19  AST 18 16  ALT 19 16  BILITOT 0.4  --   PROT 6.0*  --    Recent Labs    07/17/18 0000 11/06/18 11/20/18 03/12/19  WBC 8.0 10.1 9.2  --   NEUTROABS 6,072  --   --   --   HGB 10.7* 11.0* 10.7* 11.7*  HCT 32.6* 33* 33*  33* 36  MCV 95.6  --   --   --   PLT 325 308 322 320   Lab Results  Component Value Date   TSH 2.44 11/20/2018   Lab Results  Component Value Date   HGBA1C 5.3 01/31/2018   Lab Results  Component Value Date   CHOL 152 07/17/2018   HDL 68 07/17/2018   LDLCALC 66 07/17/2018   TRIG 94 07/17/2018   CHOLHDL 2.2 07/17/2018    Significant Diagnostic Results in last 30 days:  No results found.  Assessment/Plan  Fall Therapy evaluating patient  Seems Mechanical fall Staff will talk to family to see if they can change to lower bed for her safety Essential hypertension Mildly elevated Continue Norvasc for now  GERD Continue on Prilosec  Hypothyroidism TSH normal in 12/19 Continue same dose  CKD (chronic kidney disease) stage 3, Creat stable  Mild cognitive impairment Continue in assisted living Hyperlipidemia On Statin  Bipolar affective disorder,  Stable on Wellbutrin and Cymbalta Follows with Psychiatrist    Family/ staff Communication:  Labs/tests ordered:   Total time spent in this  patient care encounter was  25_  minutes; greater than 50% of the visit spent counseling patient and staff, reviewing records , Labs and coordinating care for problems addressed at this encounter.

## 2019-03-23 ENCOUNTER — Non-Acute Institutional Stay: Payer: Medicare Other | Admitting: Family

## 2019-03-23 ENCOUNTER — Encounter: Payer: Self-pay | Admitting: Family

## 2019-03-23 DIAGNOSIS — N183 Chronic kidney disease, stage 3 unspecified: Secondary | ICD-10-CM

## 2019-03-23 DIAGNOSIS — I10 Essential (primary) hypertension: Secondary | ICD-10-CM | POA: Diagnosis not present

## 2019-03-23 DIAGNOSIS — E034 Atrophy of thyroid (acquired): Secondary | ICD-10-CM

## 2019-03-23 DIAGNOSIS — F319 Bipolar disorder, unspecified: Secondary | ICD-10-CM

## 2019-03-23 DIAGNOSIS — D638 Anemia in other chronic diseases classified elsewhere: Secondary | ICD-10-CM

## 2019-03-23 NOTE — Progress Notes (Signed)
Location:  [facility] Room Number: 20A Place of Service:  ALF (13) Provider: Dinah Ngetich FNP-C   Mast, Man X, NP  Patient Care Team: Mast, Man X, NP as PCP - General (Nurse Practitioner) Lorretta Harp, MD as Consulting Physician (Cardiology) Aquilla Hacker, MD as Referring Physician (Psychiatry) Irene Shipper, MD as Consulting Physician (Gastroenterology) Newton Pigg, MD as Consulting Physician (Obstetrics and Gynecology)  Extended Emergency Contact Information Primary Emergency Contact: Hobbs,Diane Address: [address]          [place], [place] [address] [name] of [place] Phone: [phone] Mobile Phone: [phone] Relation: Daughter  Code Status: DNR Goals of care: Advanced Directive information Advanced Directives 03/23/2019  Does Patient Have a Medical Advance Directive? Yes  Type of Paramedic of [place];Out of facility DNR (pink MOST or yellow form)  Does patient want to make changes to medical advance directive? No - Patient declined  Copy of [place] in Chart? Yes - validated most recent copy scanned in chart (See row information)  Would patient like information on creating a medical advance directive? -  Pre-existing out of facility DNR order (yellow form or pink MOST form) Yellow form placed in chart (order not valid for inpatient use);Pink MOST form placed in chart (order not valid for inpatient use)     Chief Complaint  Patient presents with  . Medical Management of Chronic Issues    Routine    HPI:  Pt is a 85 y.o. female seen today [place] for medical management of chronic diseases.she has a medical history of Hypertension,CKD stage 3,hypothyroidism,Bipolar disorder,GERD,mild cognitive impairment among other conditions.she is seen in her room today sitting on her recliner.she denies any acute issues.Facility Nurse reports no new concerns or fall episodes in  the past two weeks.currently working with Physical therapy due to fall episode early this month.No injuries sustained.she continue to walk with front wheel walker.she has had a 2.4 lbs weight loss over one month though weight irregularity noted Wt 137.4 lbs (12/27/18); Wt 139 lbs (02/02/19); and Wt 136.6 lbs (03/02/19).        Past Medical History:  Diagnosis Date  . Anxiety and depression   . Aortic valve disease   . BBB (bundle branch block)    left, chronic  . Bipolar 1 disorder ([place])   . CKD (chronic kidney disease), stage III ([place])   . GERD (gastroesophageal reflux disease)   . Herpes zoster   . Hiatal hernia   . Hyperlipidemia   . Hypertension   . Hypertensive CKD (chronic kidney disease)   . Hypothyroid   . Osteoarthritis   . Osteopenia   . Renal artery stenosis (HCC)    left   Past Surgical History:  Procedure Laterality Date  . CARDIAC CATHETERIZATION  03/21/04   essentially normal coronary arteries with moderate decrease in left ventricular function, 80% ostial left renal artery stenosis  . COLONOSCOPY  11/11/2006   Dr. Scarlette Shorts , diverticulosis, colon polyps  . [place]  . PANENDOSCOPY  11/11/2006   Dr. Scarlette Shorts, esophageal stricture s/p balloon dilation, GERD, Zenker's Diverticulum    No Known Allergies  Allergies as of 03/23/2019   No Known Allergies     Medication List       Accurate as of March 23, 2019  1:01 PM. Always use your most recent med list.        aspirin 81 MG chewable tablet  Chew 81 mg by mouth daily.   buPROPion 150 MG 24 hr tablet Commonly known as:  WELLBUTRIN XL Take 450 mg by mouth daily. 3 tabs   [place]in 7.5 MG Tabs Take 1 tablet by mouth daily.   DULoxetine 60 MG capsule Commonly known as:  CYMBALTA Take 60 mg by mouth at bedtime.   ferrous sulfate 160 (50 Fe) MG Tbcr SR tablet Commonly known as:  Slow Iron Take 1 tablet (160 mg total) by mouth daily.   levothyroxine 50 MCG tablet Commonly known as:   SYNTHROID TAKE ONE TABLET BY MOUTH EVERY MORNING ON AN EMPTY STOMACH FOR THYROID   lithium carbonate 150 MG capsule Take 150 mg by mouth daily.   loperamide 2 MG tablet Commonly known as:  Imodium A-D 1/2 to 1 tablet daily as needed to control diarrhea if you have 3 or more loose stool.   Medical Compression Stockings Misc Apply compression stockings to both legs in morning and remove it at bedtime for leg edema   METAMUCIL FIBER PO 1 tablespoon by mouth once a morning. mix with water or juice   metoprolol succinate 25 MG 24 hr tablet Commonly known as:  TOPROL-XL Take 0.5 tablets (12.5 mg total) by mouth daily.   omeprazole 20 MG capsule Commonly known as:  PRILOSEC Take 1 capsule (20 mg total) by mouth daily.   paliperidone 3 MG 24 hr tablet Commonly known as:  INVEGA Take 3 mg by mouth daily.   simvastatin 5 MG tablet Commonly known as:  ZOCOR TAKE ONE TABLET BY MOUTH EVERY NIGHT AT BEDTIME   Vitamin D (Ergocalciferol) 1.25 MG (50000 UT) Caps capsule Commonly known as:  DRISDOL Take 1 capsule (50,000 Units total) by mouth every 7 (seven) days.       Review of Systems  Constitutional: Negative for appetite change, chills, fatigue and fever.  HENT: Positive for hearing loss. Negative for congestion, postnasal drip, rhinorrhea, sinus pressure, sinus pain, sneezing, sore throat and trouble swallowing.   Eyes: Positive for visual disturbance. Negative for discharge, redness and itching.  Respiratory: Negative for cough, chest tightness, shortness of breath and wheezing.   Cardiovascular: Negative for chest pain, palpitations and leg swelling.  Gastrointestinal: Negative for abdominal distention, abdominal pain, constipation, diarrhea, nausea and vomiting.  Endocrine: Negative for cold intolerance, heat intolerance, polydipsia, polyphagia and polyuria.  Genitourinary: Negative for difficulty urinating, dysuria, flank pain, frequency and urgency.  Musculoskeletal: Positive  for arthralgias and gait problem.  Skin: Negative for color change, pallor, rash and wound.  Neurological: Negative for dizziness, light-headedness, numbness and headaches.  Hematological: Does not bruise/bleed easily.  Psychiatric/Behavioral: Negative for agitation, confusion and sleep disturbance. The patient is not nervous/anxious.     Immunization History  Administered Date(s) Administered  . Influenza,inj,Quad PF,6+ Mos 08/27/2018  . Influenza-Unspecified 08/09/2009, 08/28/2010, 09/11/2011, 09/09/2012, 08/27/2013, 11/26/2013, 09/07/2014, 08/27/2015, 08/08/2016  . Pneumococcal Conjugate-13 06/10/2014  . Pneumococcal Polysaccharide-23 01/16/2010, 08/28/2010, 09/11/2011  . Pneumococcal-Unspecified 08/27/2015  . Td 08/03/2008  . Zoster 01/16/2010, 07/30/2011   Pertinent  Health Maintenance Due  Topic Date Due  . DEXA SCAN  10/04/1990  . INFLUENZA VACCINE  06/27/2019  . PNA vac Low Risk Adult  Completed   Fall Risk  08/13/2018 07/16/2018 01/17/2018 11/07/2016 07/10/2016  Falls in the past year? Yes Yes Yes No Yes  Number falls in past yr: 1 2 or more 2 or more - 1  Injury with Fall? No Yes Yes - Yes  Comment - Patient has a bruise  to her right forearm and shoulder.  broken ribs - -  Risk Factor Category  - High Fall Risk - - -  Risk for fall due to : - Impaired balance/gait;History of fall(s) - - -  Follow up - Follow up appointment - - -    Vitals:   03/23/19 0858  BP: (!) 146/53  Pulse: 76  Resp: 18  Temp: 98.9 F (37.2 C)  TempSrc: Oral  SpO2: 93%  Weight: 136 lb 9.6 oz (62 kg)  Height: 4\' 8"  (1.422 m)   Body mass index is 30.63 kg/m. Physical Exam Constitutional:      General: She is not in acute distress.    Appearance: She is obese. She is not ill-appearing.  HENT:     Head: Normocephalic.     Right Ear: Tympanic membrane, ear canal and external ear normal. There is no impacted cerumen.     Left Ear: Tympanic membrane, ear canal and external ear normal. There  is no impacted cerumen.     Ears:     Comments: Bilateral hearing aids in place     Nose: Nose normal. No congestion or rhinorrhea.     Mouth/Throat:     Mouth: Mucous membranes are moist.     Pharynx: Oropharynx is clear. No oropharyngeal exudate or posterior oropharyngeal erythema.  Eyes:     General: No scleral icterus.       Right eye: No discharge.        Left eye: No discharge.     Conjunctiva/sclera: Conjunctivae normal.     Pupils: Pupils are equal, round, and reactive to light.  Neck:     Musculoskeletal: Normal range of motion. No neck rigidity or muscular tenderness.     Vascular: No carotid bruit.  Cardiovascular:     Rate and Rhythm: Normal rate and regular rhythm.     Pulses: Normal pulses.     Heart sounds: No murmur. No friction rub. No gallop.   Pulmonary:     Effort: Pulmonary effort is normal. No respiratory distress.     Breath sounds: Normal breath sounds. No wheezing, rhonchi or rales.  Chest:     Chest wall: No tenderness.  Abdominal:     General: Bowel sounds are normal. There is no distension.     Palpations: Abdomen is soft. There is no mass.     Tenderness: There is no abdominal tenderness. There is no right CVA tenderness, left CVA tenderness, guarding or rebound.  Musculoskeletal:        General: No swelling or tenderness.     Right lower leg: No edema.     Left lower leg: No edema.     Comments: Moves x 4 extremities except chronic limited ROM to left shoulder.    Lymphadenopathy:     Cervical: No cervical adenopathy.  Skin:    General: Skin is warm and dry.     Coloration: Skin is not pale.     Findings: No bruising, erythema or rash.  Neurological:     Mental Status: She is alert. Mental status is at baseline.     Cranial Nerves: No cranial nerve deficit.     Motor: No weakness.     Coordination: Coordination normal.     Gait: Gait abnormal.     Comments: HOH   Psychiatric:        Mood and Affect: Mood normal.        Behavior: Behavior  normal.  Thought Content: Thought content normal.        Judgment: Judgment normal.    Labs reviewed: Recent Labs    07/11/18 2243  07/17/18 0000 08/01/18 0000 11/06/18 11/20/18 03/12/19  NA 142  --  142 140 142 141 142  K 4.3  --  5.1 4.8 4.5  --  4.2  CL 108  --  110 111*  --   --   --   CO2  --   --  22 22  --   --   --   GLUCOSE 95  --  104* 102*  --   --   --   BUN 26*  --  33* 38* 32* 30* 27*  CREATININE 1.80*   < > 1.92* 2.12* 1.8* 1.7* 1.7*  CALCIUM  --   --  9.2 9.6  --   --   --    < > = values in this interval not displayed.   Recent Labs    07/17/18 0000 03/12/19  AST 18 16  ALT 19 16  BILITOT 0.4  --   PROT 6.0*  --    Recent Labs    07/17/18 0000 11/06/18 11/20/18 03/12/19  WBC 8.0 10.1 9.2  --   NEUTROABS 6,072  --   --   --   HGB 10.7* 11.0* 10.7* 11.7*  HCT 32.6* 33* 33*  33* 36  MCV 95.6  --   --   --   PLT 325 308 322 320   Lab Results  Component Value Date   TSH 2.44 11/20/2018   Lab Results  Component Value Date   HGBA1C 5.3 01/31/2018   Lab Results  Component Value Date   CHOL 152 07/17/2018   HDL 68 07/17/2018   LDLCALC 66 07/17/2018   TRIG 94 07/17/2018   CHOLHDL 2.2 07/17/2018    Significant Diagnostic Results in last 30 days:  No results found.  Assessment/Plan 1. Essential hypertension B/p reviewed stable.continue on metoprolol 12.5 mg tablet daily.on ASA and statin for prophylaxis.  2. CKD (chronic kidney disease) stage 3, GFR 30-59 ml/min (HCC) CR 1.7 at baseline.continue to dose medication for renal clearance and avoid nephrotoxins.  3. Bipolar affective disorder, remission status unspecified (HCC) Mood stable.continue on Wellbutrin 450 mg tablet daily,DuLoxetine 60 mg capsule at bedtime and Lithium 150 mg capsule and paliperidone 3 mg tablet daily. - Lithium level 04/02/2019   4. Hypothyroidism due to acquired atrophy of thyroid Continue on levothyroxine 50 mcg tablet daily. Lab Results  Component Value Date    TSH 2.44 11/20/2018   - TSH level 04/02/2019   5. Anemia, chronic disease Hgb 11.7 ( 03/12/2019)The current medical regimen is effective;  continue present plan and medications.will monitor H/H for now.    Family/ staff Communication: Reviewed plan of care with patient and facility Nurse supervisor   Labs/tests ordered: Lithium level and TSH level  04/02/2019  Sandrea Hughs, NP

## 2019-04-03 LAB — TSH: TSH: 3.16 (ref 0.41–5.90)

## 2019-05-18 ENCOUNTER — Encounter: Payer: Self-pay | Admitting: Internal Medicine

## 2019-05-18 ENCOUNTER — Non-Acute Institutional Stay: Payer: Medicare Other | Admitting: Internal Medicine

## 2019-05-18 DIAGNOSIS — I10 Essential (primary) hypertension: Secondary | ICD-10-CM

## 2019-05-18 DIAGNOSIS — M25472 Effusion, left ankle: Secondary | ICD-10-CM

## 2019-05-18 DIAGNOSIS — D638 Anemia in other chronic diseases classified elsewhere: Secondary | ICD-10-CM

## 2019-05-18 DIAGNOSIS — R1310 Dysphagia, unspecified: Secondary | ICD-10-CM

## 2019-05-18 DIAGNOSIS — E7800 Pure hypercholesterolemia, unspecified: Secondary | ICD-10-CM

## 2019-05-18 DIAGNOSIS — E034 Atrophy of thyroid (acquired): Secondary | ICD-10-CM

## 2019-05-18 DIAGNOSIS — M25471 Effusion, right ankle: Secondary | ICD-10-CM

## 2019-05-18 DIAGNOSIS — N183 Chronic kidney disease, stage 3 unspecified: Secondary | ICD-10-CM

## 2019-05-18 DIAGNOSIS — G3184 Mild cognitive impairment, so stated: Secondary | ICD-10-CM

## 2019-05-18 DIAGNOSIS — F319 Bipolar disorder, unspecified: Secondary | ICD-10-CM

## 2019-05-18 NOTE — Progress Notes (Signed)
Location:  [facility] Room Number: 20/A Place of Service:  ALF (13) Provider:Cheronda Erck L,MD   Mast, Man X, NP  Patient Care Team: Mast, Man X, NP as PCP - General (Nurse Practitioner) Lorretta Harp, MD as Consulting Physician (Cardiology) Aquilla Hacker, MD as Referring Physician (Psychiatry) Irene Shipper, MD as Consulting Physician (Gastroenterology) Newton Pigg, MD as Consulting Physician (Obstetrics and Gynecology)  Extended Emergency Contact Information Primary Emergency Contact: Hobbs,Diane Address: [address]          [place], [place] [address] [name] of [place] Phone: [phone] Mobile Phone: [phone] Relation: Daughter  Code Status: DNR Goals of care: Advanced Directive information Advanced Directives 05/18/2019  Does Patient Have a Medical Advance Directive? Yes  Type of Advance Directive Out of facility DNR (pink MOST or yellow form);Healthcare Power of Attorney  Does patient want to make changes to medical advance directive? No - Patient declined  Copy of [place] in Chart? Yes - validated most recent copy scanned in chart (See row information)  Would patient like information on creating a medical advance directive? -  Pre-existing out of facility DNR order (yellow form or pink MOST form) Yellow form placed in chart (order not valid for inpatient use);Pink MOST form placed in chart (order not valid for inpatient use)     Chief Complaint  Patient presents with  . Acute Visit    Medication review    HPI:  Pt is a 85 y.o. female seen today for an acute visit for Inability to swallow her Capsules and Change in therapy warranted  Patient has a history of hypertension, hypothyroidism, Hyperlipidemia, CKD stage 3, GERD, Anemia ,mild cognitive impairment, falls And Bipolar Illness follows with Outside psychiatrist  Patient is doing well in facility. Nurses have noticed her to be having issues  swallowing her Capsules and Per pharmacy most of then cannot be crushed. Patient herself did not have any acute Complains. She does have Cognitive impairment but is Independent in her ADLS. Has not had any Falls.Walks with the [employer]. Has Lost few pounds in Weight but mostly stable    Past Medical History:  Diagnosis Date  . Anxiety and depression   . Aortic valve disease   . BBB (bundle branch block)    left, chronic  . Bipolar 1 disorder ([place])   . CKD (chronic kidney disease), stage III ([place])   . GERD (gastroesophageal reflux disease)   . Herpes zoster   . Hiatal hernia   . Hyperlipidemia   . Hypertension   . Hypertensive CKD (chronic kidney disease)   . Hypothyroid   . Osteoarthritis   . Osteopenia   . Renal artery stenosis (HCC)    left   Past Surgical History:  Procedure Laterality Date  . CARDIAC CATHETERIZATION  03/21/04   essentially normal coronary arteries with moderate decrease in left ventricular function, 80% ostial left renal artery stenosis  . COLONOSCOPY  11/11/2006   Dr. Scarlette Shorts , diverticulosis, colon polyps  . [place]  . PANENDOSCOPY  11/11/2006   Dr. Scarlette Shorts, esophageal stricture s/p balloon dilation, GERD, Zenker's Diverticulum    No Known Allergies  Outpatient Encounter Medications as of 05/18/2019  Medication Sig  . aspirin 81 MG chewable tablet Chew 81 mg by mouth daily.  Marland Kitchen buPROPion (WELLBUTRIN XL) 150 MG 24 hr tablet Take 450 mg by mouth daily. 3 tabs  . DULoxetine (CYMBALTA) 60 MG capsule Take 60 mg  by mouth at bedtime.   Water engineer Bandages & Supports (MEDICAL COMPRESSION STOCKINGS) MISC Apply compression stockings to both legs in morning and remove it at bedtime for leg edema  . ferrous sulfate (SLOW IRON) 160 (50 Fe) MG TBCR SR tablet Take 1 tablet (160 mg total) by mouth daily.  Marland Kitchen L-Methylfolate (DEPLIN) 7.5 MG TABS Take 1 tablet by mouth daily.   Marland Kitchen levothyroxine (SYNTHROID, LEVOTHROID) 50 MCG tablet TAKE ONE TABLET BY  MOUTH EVERY MORNING ON AN EMPTY STOMACH FOR THYROID  . lithium carbonate 150 MG capsule Take 150 mg by mouth daily.  Marland Kitchen loperamide (IMODIUM A-D) 2 MG tablet 1/2 to 1 tablet daily as needed to control diarrhea if you have 3 or more loose stool.  . metoprolol succinate (TOPROL-XL) 25 MG 24 hr tablet Take 0.5 tablets (12.5 mg total) by mouth daily.  Marland Kitchen omeprazole (PRILOSEC) 20 MG capsule Take 1 capsule (20 mg total) by mouth daily.  . paliperidone (INVEGA) 3 MG 24 hr tablet Take 3 mg by mouth daily.  . Psyllium (METAMUCIL FIBER PO) 1 tablespoon by mouth once a morning. mix with water or juice  . simvastatin (ZOCOR) 5 MG tablet TAKE ONE TABLET BY MOUTH EVERY NIGHT AT BEDTIME  . Vitamin D, Ergocalciferol, (DRISDOL) 50000 units CAPS capsule Take 1 capsule (50,000 Units total) by mouth every 7 (seven) days.   No facility-administered encounter medications on file as of 05/18/2019.     Review of Systems  Constitutional: Negative.   HENT: Negative.   Respiratory: Negative.   Cardiovascular: Positive for leg swelling.  Gastrointestinal: Negative.   Genitourinary: Negative.   Musculoskeletal: Negative.   Neurological: Positive for weakness.  Psychiatric/Behavioral: Positive for confusion and dysphoric mood.    Immunization History  Administered Date(s) Administered  . Influenza,inj,Quad PF,6+ Mos 08/27/2018  . Influenza-Unspecified 08/09/2009, 08/28/2010, 09/11/2011, 09/09/2012, 08/27/2013, 11/26/2013, 09/07/2014, 08/27/2015, 08/08/2016  . Pneumococcal Conjugate-13 06/10/2014  . Pneumococcal Polysaccharide-23 01/16/2010, 08/28/2010, 09/11/2011  . Pneumococcal-Unspecified 08/27/2015  . Td 08/03/2008  . Zoster 01/16/2010, 07/30/2011   Pertinent  Health Maintenance Due  Topic Date Due  . DEXA SCAN  10/04/1990  . INFLUENZA VACCINE  06/27/2019  . PNA vac Low Risk Adult  Completed   Fall Risk  08/13/2018 07/16/2018 01/17/2018 11/07/2016 07/10/2016  Falls in the past year? Yes Yes Yes No Yes   Number falls in past yr: 1 2 or more 2 or more - 1  Injury with Fall? No Yes Yes - Yes  Comment - Patient has a bruise to her right forearm and shoulder.  broken ribs - -  Risk Factor Category  - High Fall Risk - - -  Risk for fall due to : - Impaired balance/gait;History of fall(s) - - -  Follow up - Follow up appointment - - -   Functional Status Survey:    Vitals:   05/18/19 1221  BP: (!) 145/55  Pulse: 62  Resp: 20  Temp: 98 F (36.7 C)  SpO2: 98%  Weight: 134 lb (60.8 kg)  Height: 4\' 8"  (1.422 m)   Body mass index is 30.04 kg/m. Physical Exam Vitals signs reviewed.  Constitutional:      Appearance: Normal appearance.  HENT:     Head: Normocephalic.     Nose: Nose normal.     Mouth/Throat:     Mouth: Mucous membranes are moist.     Pharynx: Oropharynx is clear.  Eyes:     Pupils: Pupils are equal, round, and reactive to light.  Neck:     Musculoskeletal: Neck supple.  Cardiovascular:     Rate and Rhythm: Normal rate and regular rhythm.     Pulses: Normal pulses.  Pulmonary:     Effort: Pulmonary effort is normal.     Breath sounds: Normal breath sounds.  Abdominal:     General: Abdomen is flat. Bowel sounds are normal.     Palpations: Abdomen is soft.  Musculoskeletal:     Comments: Mild edema Bilateral  Skin:    General: Skin is warm and dry.  Neurological:     General: No focal deficit present.     Mental Status: She is alert.  Psychiatric:     Comments: Has flat Affect with Delayed Speech     Labs reviewed: Recent Labs    07/11/18 2243  07/17/18 0000 08/01/18 0000 11/06/18 11/20/18 03/12/19  NA 142  --  142 140 142 141 142  K 4.3  --  5.1 4.8 4.5  --  4.2  CL 108  --  110 111*  --   --   --   CO2  --   --  22 22  --   --   --   GLUCOSE 95  --  104* 102*  --   --   --   BUN 26*  --  33* 38* 32* 30* 27*  CREATININE 1.80*   < > 1.92* 2.12* 1.8* 1.7* 1.7*  CALCIUM  --   --  9.2 9.6  --   --   --    < > = values in this interval not  displayed.   Recent Labs    07/17/18 0000 03/12/19  AST 18 16  ALT 19 16  BILITOT 0.4  --   PROT 6.0*  --    Recent Labs    07/17/18 0000 11/06/18 11/20/18 03/12/19  WBC 8.0 10.1 9.2  --   NEUTROABS 6,072  --   --   --   HGB 10.7* 11.0* 10.7* 11.7*  HCT 32.6* 33* 33*  33* 36  MCV 95.6  --   --   --   PLT 325 308 322 320   Lab Results  Component Value Date   TSH 3.16 04/03/2019   Lab Results  Component Value Date   HGBA1C 5.3 01/31/2018   Lab Results  Component Value Date   CHOL 152 07/17/2018   HDL 68 07/17/2018   LDLCALC 66 07/17/2018   TRIG 94 07/17/2018   CHOLHDL 2.2 07/17/2018    Significant Diagnostic Results in last 30 days:  No results found.  Assessment/Plan Essential hypertension - Plan:  Change her Metoprolol to Tartrate 12.5 mg BID for easy Crushing  Dysphagia, unspecified type - Plan:  Will try to change Meds for [place] so she can swallow easily  Hypothyroidism due to acquired atrophy of thyroid - Plan:  TSH was normal in 5/20 Same dose for now  CKD (chronic kidney disease) stage 3, GFR 30-59 ml/min (HCC) - Plan:  Creat Stable  Mild cognitive impairment - Plan:  Continue Supportive Care  Pure hypercholesterolemia - Plan:  On Low dose of Statin  Bipolar affective disorder,  - Plan:  Doins well on Lithium, Cymbalta and Wellbutrin and Invega Would not change these meds as prescribed by the Psychatrist Nurses will follow up with Psychiatrist if she continues to have issues with these Meds  Ankle edema, bilateral - Plan:  Continue Ted hoses  Anemia, chronic disease - Plan:  Change her Iron  to Liquid and 2/week with meals Vit D Change to 1000 units Q d for easy swolling   Family/ staff Communication:   Labs/tests ordered:   Total time spent in this patient care encounter was  25_  minutes; greater than 50% of the visit spent counseling patient and staff, reviewing records , Labs and coordinating care for problems addressed at this  encounter.

## 2019-05-25 LAB — HEPATIC FUNCTION PANEL
ALT: 43 — AB (ref 7–35)
AST: 30 (ref 13–35)

## 2019-05-25 LAB — BASIC METABOLIC PANEL
BUN: 37 — AB (ref 4–21)
Creatinine: 1.8 — AB (ref 0.5–1.1)
Glucose: 107
Potassium: 4.4 (ref 3.4–5.3)
Sodium: 138 (ref 137–147)

## 2019-05-25 LAB — CBC AND DIFFERENTIAL
HCT: 30 — AB (ref 36–46)
Hemoglobin: 10.1 — AB (ref 12.0–16.0)
Platelets: 288 (ref 150–399)

## 2019-06-01 ENCOUNTER — Non-Acute Institutional Stay: Payer: Medicare Other | Admitting: Internal Medicine

## 2019-06-01 ENCOUNTER — Encounter: Payer: Self-pay | Admitting: Internal Medicine

## 2019-06-01 DIAGNOSIS — L84 Corns and callosities: Secondary | ICD-10-CM

## 2019-06-01 DIAGNOSIS — I10 Essential (primary) hypertension: Secondary | ICD-10-CM | POA: Diagnosis not present

## 2019-06-01 NOTE — Progress Notes (Signed)
Location:  [facility] Room Number: 20/A Place of Service:  ALF [phone]) Provider: Veleta Miners L,MD   Mast, Man X, NP  Patient Care Team: Mast, Man X, NP as PCP - General (Nurse Practitioner) Lorretta Harp, MD as Consulting Physician (Cardiology) Aquilla Hacker, MD as Referring Physician (Psychiatry) Irene Shipper, MD as Consulting Physician (Gastroenterology) Newton Pigg, MD as Consulting Physician (Obstetrics and Gynecology)  Extended Emergency Contact Information Primary Emergency Contact: Hobbs,Diane Address: [address]          [place],  [address] [name] of [place] Phone: [phone] Mobile Phone: [phone] Relation: Daughter  Code Status:DNR Goals of care: Advanced Directive information Advanced Directives 06/01/2019  Does Patient Have a Medical Advance Directive? Yes  Type of Paramedic of [place];Out of facility DNR (pink MOST or yellow form)  Does patient want to make changes to medical advance directive? No - Patient declined  Copy of [place] in Chart? Yes - validated most recent copy scanned in chart (See row information)  Would patient like information on creating a medical advance directive? -  Pre-existing out of facility DNR order (yellow form or pink MOST form) Yellow form placed in chart (order not valid for inpatient use);Pink MOST form placed in chart (order not valid for inpatient use)     Chief Complaint  Patient presents with  . Acute Visit    Redness in leg    HPI:  Pt is a 85 y.o. female seen today for an acute visit for Redness and Pain for both feet  Patient has a history of hypertension, hypothyroidism, Hyperlipidemia, CKDstage 3,GERD, Anemia,mild cognitive impairment,falls And Bipolar Illness follows with Outside psychiatrist  Patient Recently has been c/o Pain in Both her Feet and Unable to walk. No Claudication. No Numbness. Does have  swelling in her Legs but she refused to wear Ted hoses today due to pain. She says she use to see Podiatry for her Feet and has not seen anyone due to Covid Restrictions   Past Medical History:  Diagnosis Date  . Anxiety and depression   . Aortic valve disease   . BBB (bundle branch block)    left, chronic  . Bipolar 1 disorder ([place])   . CKD (chronic kidney disease), stage III ([place])   . GERD (gastroesophageal reflux disease)   . Herpes zoster   . Hiatal hernia   . Hyperlipidemia   . Hypertension   . Hypertensive CKD (chronic kidney disease)   . Hypothyroid   . Osteoarthritis   . Osteopenia   . Renal artery stenosis (HCC)    left   Past Surgical History:  Procedure Laterality Date  . CARDIAC CATHETERIZATION  03/21/04   essentially normal coronary arteries with moderate decrease in left ventricular function, 80% ostial left renal artery stenosis  . COLONOSCOPY  11/11/2006   Dr. Scarlette Shorts , diverticulosis, colon polyps  . [place]  . PANENDOSCOPY  11/11/2006   Dr. Scarlette Shorts, esophageal stricture s/p balloon dilation, GERD, Zenker's Diverticulum    No Known Allergies  Outpatient Encounter Medications as of 06/01/2019  Medication Sig  . aspirin 81 MG chewable tablet Chew 81 mg by mouth daily.  Marland Kitchen buPROPion (WELLBUTRIN XL) 150 MG 24 hr tablet Take 450 mg by mouth daily. 3 tabs  . cholecalciferol (VITAMIN D3) 25 MCG (1000 UT) tablet Take 1,000 Units by mouth daily.  . DULoxetine (CYMBALTA) 60 MG capsule Take 60 mg  by mouth at bedtime.   Water engineer Bandages & Supports (MEDICAL COMPRESSION STOCKINGS) MISC Apply compression stockings to both legs in morning and remove it at bedtime for leg edema  . ferrous sulfate 220 (44 Fe) MG/5ML solution Take 220 mg by mouth daily. 3.62ml once a morning Mon,Wed, fRI  . L-Methylfolate (DEPLIN) 7.5 MG TABS Take 1 tablet by mouth daily.   Marland Kitchen levothyroxine (SYNTHROID, LEVOTHROID) 50 MCG tablet TAKE ONE TABLET BY MOUTH EVERY MORNING ON  AN EMPTY STOMACH FOR THYROID  . lithium carbonate 150 MG capsule Take 150 mg by mouth daily.  Marland Kitchen loperamide (IMODIUM A-D) 2 MG tablet 1/2 to 1 tablet daily as needed to control diarrhea if you have 3 or more loose stool.  . metoprolol succinate (TOPROL-XL) 25 MG 24 hr tablet Take 0.5 tablets (12.5 mg total) by mouth daily. (Patient taking differently: Take 12.5 mg by mouth 2 (two) times a day. )  . omeprazole (PRILOSEC) 20 MG capsule Take 1 capsule (20 mg total) by mouth daily.  . paliperidone (INVEGA) 3 MG 24 hr tablet Take 3 mg by mouth daily.  . Psyllium (METAMUCIL FIBER PO) 1 tablespoon by mouth once a morning. mix with water or juice  . simvastatin (ZOCOR) 5 MG tablet TAKE ONE TABLET BY MOUTH EVERY NIGHT AT BEDTIME  . [DISCONTINUED] ferrous sulfate (SLOW IRON) 160 (50 Fe) MG TBCR SR tablet Take 1 tablet (160 mg total) by mouth daily.  . [DISCONTINUED] Vitamin D, Ergocalciferol, (DRISDOL) 50000 units CAPS capsule Take 1 capsule (50,000 Units total) by mouth every 7 (seven) days.   No facility-administered encounter medications on file as of 06/01/2019.     Review of Systems  Constitutional: Negative.   HENT: Negative.   Respiratory: Negative.   Cardiovascular: Positive for leg swelling.  Gastrointestinal: Negative.   Genitourinary: Negative.   Musculoskeletal: Negative.   Skin: Positive for color change.  Neurological: Negative for numbness.  Psychiatric/Behavioral: Positive for confusion.    Immunization History  Administered Date(s) Administered  . Influenza,inj,Quad PF,6+ Mos 08/27/2018  . Influenza-Unspecified 08/09/2009, 08/28/2010, 09/11/2011, 09/09/2012, 08/27/2013, 11/26/2013, 09/07/2014, 08/27/2015, 08/08/2016  . Pneumococcal Conjugate-13 06/10/2014  . Pneumococcal Polysaccharide-23 01/16/2010, 08/28/2010, 09/11/2011  . Pneumococcal-Unspecified 08/27/2015  . Td 08/03/2008  . Zoster 01/16/2010, 07/30/2011   Pertinent  Health Maintenance Due  Topic Date Due  . DEXA SCAN   10/04/1990  . INFLUENZA VACCINE  06/27/2019  . PNA vac Low Risk Adult  Completed   Fall Risk  08/13/2018 07/16/2018 01/17/2018 11/07/2016 07/10/2016  Falls in the past year? Yes Yes Yes No Yes  Number falls in past yr: 1 2 or more 2 or more - 1  Injury with Fall? No Yes Yes - Yes  Comment - Patient has a bruise to her right forearm and shoulder.  broken ribs - -  Risk Factor Category  - High Fall Risk - - -  Risk for fall due to : - Impaired balance/gait;History of fall(s) - - -  Follow up - Follow up appointment - - -   Functional Status Survey:    Vitals:   06/01/19 1456  BP: 138/62  Pulse: 60  Resp: 20  Temp: 98.8 F (37.1 C)  SpO2: 96%  Weight: 132 lb 9.6 oz (60.1 kg)  Height: 4\' 8"  (1.422 m)   Body mass index is 29.73 kg/m. Physical Exam Vitals signs reviewed.  Constitutional:      Appearance: Normal appearance.  HENT:     Head: Normocephalic.  Nose: Nose normal.     Mouth/Throat:     Mouth: Mucous membranes are moist.     Pharynx: Oropharynx is clear.  Eyes:     Pupils: Pupils are equal, round, and reactive to light.  Neck:     Musculoskeletal: Neck supple.  Cardiovascular:     Rate and Rhythm: Normal rate and regular rhythm.     Pulses: Normal pulses.     Heart sounds: Normal heart sounds.  Pulmonary:     Effort: Pulmonary effort is normal.     Breath sounds: Normal breath sounds.  Abdominal:     General: Abdomen is flat. Bowel sounds are normal.     Palpations: Abdomen is soft.  Musculoskeletal:     Comments: Has Moderate Swelling In Both LE. She had 2 big Calluses in her Both Feet. They were Mildly Tender. No infection  Neurological:     General: No focal deficit present.     Mental Status: She is alert.  Psychiatric:        Mood and Affect: Mood normal.        Thought Content: Thought content normal.     Labs reviewed: Recent Labs    07/11/18 2243  07/17/18 0000 08/01/18 0000  11/06/18 11/20/18 03/12/19 05/25/19  NA 142  --  142 140   <  > 142 141 142 138  K 4.3  --  5.1 4.8  --  4.5  --  4.2 4.4  CL 108  --  110 111*  --   --   --   --   --   CO2  --   --  22 22  --   --   --   --   --   GLUCOSE 95  --  104* 102*  --   --   --   --   --   BUN 26*  --  33* 38*   < > 32* 30* 27* 37*  CREATININE 1.80*   < > 1.92* 2.12*   < > 1.8* 1.7* 1.7* 1.8*  CALCIUM  --   --  9.2 9.6  --   --   --   --   --    < > = values in this interval not displayed.   Recent Labs    07/17/18 0000 03/12/19 05/25/19  AST 18 16 30   ALT 19 16 43*  BILITOT 0.4  --   --   PROT 6.0*  --   --    Recent Labs    07/17/18 0000 11/06/18 11/20/18 03/12/19 05/25/19  WBC 8.0 10.1 9.2  --   --   NEUTROABS 6,072  --   --   --   --   HGB 10.7* 11.0* 10.7* 11.7* 10.1*  HCT 32.6* 33* 33*  33* 36 30*  MCV 95.6  --   --   --   --   PLT 325 308 322 320 288   Lab Results  Component Value Date   TSH 3.16 04/03/2019   Lab Results  Component Value Date   HGBA1C 5.3 01/31/2018   Lab Results  Component Value Date   CHOL 152 07/17/2018   HDL 68 07/17/2018   LDLCALC 66 07/17/2018   TRIG 94 07/17/2018   CHOLHDL 2.2 07/17/2018    Significant Diagnostic Results in last 30 days:  No results found.  Assessment/Plan Bilateral Feet Calluses Patient says she use to see Podiatry for this Unfortunately has not been able  to see anyone due to Covid Will try to see if we can make appointment with outside [place] Will try Salicylic Acid Pads for 1 week I dont see any other reason for her pain at this time Will follow     Family/ staff Communication:  Labs/tests ordered:  Total time spent in this patient care encounter was  25_  minutes; greater than 50% of the visit spent counseling patient and staff, reviewing records , Labs and coordinating care for problems addressed at this encounter.

## 2019-06-12 ENCOUNTER — Encounter: Payer: Self-pay | Admitting: Nurse Practitioner

## 2019-06-12 DIAGNOSIS — L089 Local infection of the skin and subcutaneous tissue, unspecified: Secondary | ICD-10-CM | POA: Insufficient documentation

## 2019-06-22 LAB — CBC AND DIFFERENTIAL
HCT: 32 — AB (ref 36–46)
Hemoglobin: 10.4 — AB (ref 12.0–16.0)
Platelets: 272 (ref 150–399)
WBC: 9.7

## 2019-06-22 LAB — BASIC METABOLIC PANEL
BUN: 26 — AB (ref 4–21)
Creatinine: 1.8 — AB (ref 0.5–1.1)
Glucose: 93
Potassium: 4.9 (ref 3.4–5.3)
Sodium: 140 (ref 137–147)

## 2019-06-30 ENCOUNTER — Encounter: Payer: Self-pay | Admitting: Nurse Practitioner

## 2019-06-30 ENCOUNTER — Other Ambulatory Visit: Payer: Self-pay | Admitting: *Deleted

## 2019-06-30 ENCOUNTER — Non-Acute Institutional Stay: Payer: Medicare Other | Admitting: Nurse Practitioner

## 2019-06-30 DIAGNOSIS — K219 Gastro-esophageal reflux disease without esophagitis: Secondary | ICD-10-CM

## 2019-06-30 DIAGNOSIS — N183 Chronic kidney disease, stage 3 unspecified: Secondary | ICD-10-CM

## 2019-06-30 DIAGNOSIS — I10 Essential (primary) hypertension: Secondary | ICD-10-CM

## 2019-06-30 DIAGNOSIS — E034 Atrophy of thyroid (acquired): Secondary | ICD-10-CM

## 2019-06-30 DIAGNOSIS — D638 Anemia in other chronic diseases classified elsewhere: Secondary | ICD-10-CM

## 2019-06-30 DIAGNOSIS — F319 Bipolar disorder, unspecified: Secondary | ICD-10-CM

## 2019-06-30 LAB — CHLORIDE
Calcium: 9.3
Carbon Dioxide, Total: 27
Chloride: 108

## 2019-06-30 NOTE — Assessment & Plan Note (Signed)
mood is stable, continue Lithium 150mg  qd, Duloxetine 60mg  qd, Wellbutrin 450mg  qd.

## 2019-06-30 NOTE — Progress Notes (Signed)
Location:  [place] Room Number: 20 Place of Service:  ALF [phone]) Provider:  Marlana Latus  NP  Ramez Arrona X, NP  Patient Care Team: Rexton Greulich X, NP as PCP - General (Nurse Practitioner) Lorretta Harp, MD as Consulting Physician (Cardiology) Aquilla Hacker, MD as Referring Physician (Psychiatry) Irene Shipper, MD as Consulting Physician (Gastroenterology) Newton Pigg, MD as Consulting Physician (Obstetrics and Gynecology)  Extended Emergency Contact Information Primary Emergency Contact: Hobbs,Diane Address: [address]          [place], [place] [address] [name] of [place] Phone: [phone] Mobile Phone: [phone] Relation: Daughter  Code Status:  DNR Goals of care: Advanced Directive information Advanced Directives 06/01/2019  Does Patient Have a Medical Advance Directive? Yes  Type of Paramedic of [place];Out of facility DNR (pink MOST or yellow form)  Does patient want to make changes to medical advance directive? No - Patient declined  Copy of [place] in Chart? Yes - validated most recent copy scanned in chart (See row information)  Would patient like information on creating a medical advance directive? -  Pre-existing out of facility DNR order (yellow form or pink MOST form) Yellow form placed in chart (order not valid for inpatient use);Pink MOST form placed in chart (order not valid for inpatient use)     Chief Complaint  Patient presents with  . Medical Management of Chronic Issues    HPI:  Pt is a 85 y.o. female seen today for medical management of chronic diseases.     The patient resides in [place] for safety and care assistance, ambulates with walker. Her mood is stable, taking Lithium 150mg  qd, Duloxetine 60mg  qd, Wellbutrin 450mg  qd. Hx of GERD, stable on Omeprazole 20mg  qd. HTN, blood pressure is controlled on Metoprolol 12.5mg  qd. Hypothyroidism, on Levothyroxine  37mcg qd, last TSH 3.16 04/03/19. Anemia, last Hgb10.4 06/22/19. On Fe 220mg  qd. CKD, creat baseline 1.8.   Past Medical History:  Diagnosis Date  . Anxiety and depression   . Aortic valve disease   . BBB (bundle branch block)    left, chronic  . Bipolar 1 disorder ([place])   . CKD (chronic kidney disease), stage III ([place])   . GERD (gastroesophageal reflux disease)   . Herpes zoster   . Hiatal hernia   . Hyperlipidemia   . Hypertension   . Hypertensive CKD (chronic kidney disease)   . Hypothyroid   . Osteoarthritis   . Osteopenia   . Renal artery stenosis (HCC)    left   Past Surgical History:  Procedure Laterality Date  . CARDIAC CATHETERIZATION  03/21/04   essentially normal coronary arteries with moderate decrease in left ventricular function, 80% ostial left renal artery stenosis  . COLONOSCOPY  11/11/2006   Dr. Scarlette Shorts , diverticulosis, colon polyps  . [place]  . PANENDOSCOPY  11/11/2006   Dr. Scarlette Shorts, esophageal stricture s/p balloon dilation, GERD, Zenker's Diverticulum    No Known Allergies  Outpatient Encounter Medications as of 06/30/2019  Medication Sig  . aspirin 81 MG chewable tablet Chew 81 mg by mouth daily.  Marland Kitchen buPROPion (WELLBUTRIN XL) 150 MG 24 hr tablet Take 450 mg by mouth daily. 3 tabs  . cholecalciferol (VITAMIN D3) 25 MCG (1000 UT) tablet Take 1,000 Units by mouth daily.  . DULoxetine (CYMBALTA) 60 MG capsule Take 60 mg by mouth at bedtime.   Regino Schultze Bandages & Supports (  MEDICAL COMPRESSION STOCKINGS) MISC Apply compression stockings to both legs in morning and remove it at bedtime for leg edema  . ferrous sulfate 220 (44 Fe) MG/5ML solution Take 220 mg by mouth daily. 3.95ml once a morning Mon,Wed, fRI  . L-Methylfolate (DEPLIN) 7.5 MG TABS Take 1 tablet by mouth daily.   Marland Kitchen levothyroxine (SYNTHROID, LEVOTHROID) 50 MCG tablet TAKE ONE TABLET BY MOUTH EVERY MORNING ON AN EMPTY STOMACH FOR THYROID  . lithium carbonate 150 MG capsule Take  150 mg by mouth daily.  Marland Kitchen loperamide (IMODIUM A-D) 2 MG tablet 1/2 to 1 tablet daily as needed to control diarrhea if you have 3 or more loose stool.  . metoprolol succinate (TOPROL-XL) 25 MG 24 hr tablet Take 0.5 tablets (12.5 mg total) by mouth daily. (Patient taking differently: Take 12.5 mg by mouth 2 (two) times a day. )  . omeprazole (PRILOSEC) 20 MG capsule Take 1 capsule (20 mg total) by mouth daily.  . paliperidone (INVEGA) 3 MG 24 hr tablet Take 3 mg by mouth daily.  . Psyllium (METAMUCIL FIBER PO) 1 tablespoon by mouth once a morning. mix with water or juice  . simvastatin (ZOCOR) 5 MG tablet TAKE ONE TABLET BY MOUTH EVERY NIGHT AT BEDTIME   No facility-administered encounter medications on file as of 06/30/2019.    ROS was provided with assistance of staff Review of Systems  Constitutional: Negative for activity change, appetite change, chills, diaphoresis, fatigue, fever and unexpected weight change.  HENT: Positive for hearing loss. Negative for voice change.   Respiratory: Negative for cough, shortness of breath and wheezing.   Cardiovascular: Positive for leg swelling. Negative for chest pain and palpitations.  Gastrointestinal: Negative for abdominal distention, abdominal pain, constipation, diarrhea, nausea and vomiting.  Genitourinary: Negative for difficulty urinating, dysuria and urgency.       Nocturnal urination average 2x/night  Musculoskeletal: Positive for gait problem.  Skin: Negative for color change and pallor.  Neurological: Negative for dizziness, speech difficulty, weakness and headaches.       Memory lapses  Psychiatric/Behavioral: Negative for agitation, behavioral problems, hallucinations and sleep disturbance. The patient is not nervous/anxious.     Immunization History  Administered Date(s) Administered  . Influenza,inj,Quad PF,6+ Mos 08/27/2018  . Influenza-Unspecified 08/09/2009, 08/28/2010, 09/11/2011, 09/09/2012, 08/27/2013, 11/26/2013, 09/07/2014,  08/27/2015, 08/08/2016  . Pneumococcal Conjugate-13 06/10/2014  . Pneumococcal Polysaccharide-23 01/16/2010, 08/28/2010, 09/11/2011  . Pneumococcal-Unspecified 08/27/2015  . Td 08/03/2008  . Zoster 01/16/2010, 07/30/2011   Pertinent  Health Maintenance Due  Topic Date Due  . DEXA SCAN  10/04/1990  . INFLUENZA VACCINE  06/27/2019  . PNA vac Low Risk Adult  Completed   Fall Risk  08/13/2018 07/16/2018 01/17/2018 11/07/2016 07/10/2016  Falls in the past year? Yes Yes Yes No Yes  Number falls in past yr: 1 2 or more 2 or more - 1  Injury with Fall? No Yes Yes - Yes  Comment - Patient has a bruise to her right forearm and shoulder.  broken ribs - -  Risk Factor Category  - High Fall Risk - - -  Risk for fall due to : - Impaired balance/gait;History of fall(s) - - -  Follow up - Follow up appointment - - -   Functional Status Survey:    Vitals:   06/30/19 1228  BP: 134/76  Pulse: 70  Resp: 20  Temp: (!) 97.4 F (36.3 C)  SpO2: 90%  Weight: 132 lb 9.6 oz (60.1 kg)  Height: 4\' 8"  (1.422 m)  Body mass index is 29.73 kg/m. Physical Exam Vitals signs reviewed.  Constitutional:      General: She is not in acute distress.    Appearance: Normal appearance. She is not ill-appearing, toxic-appearing or diaphoretic.     Comments: Over weight  HENT:     Head: Normocephalic and atraumatic.     Nose: Nose normal.     Mouth/Throat:     Mouth: Mucous membranes are moist.  Eyes:     Extraocular Movements: Extraocular movements intact.     Conjunctiva/sclera: Conjunctivae normal.     Pupils: Pupils are equal, round, and reactive to light.  Neck:     Musculoskeletal: Normal range of motion and neck supple.  Cardiovascular:     Rate and Rhythm: Normal rate and regular rhythm.     Heart sounds: No murmur.  Pulmonary:     Breath sounds: No wheezing, rhonchi or rales.  Abdominal:     General: There is no distension.     Palpations: Abdomen is soft.     Tenderness: There is no abdominal  tenderness. There is no right CVA tenderness, left CVA tenderness or guarding.  Musculoskeletal:        General: No deformity.     Right lower leg: Edema present.     Left lower leg: Edema present.     Comments: Trace edema BLE, TED, ambulates with walker. Left shoulder limited ROM for overhead movement.  Skin:    General: Skin is warm and dry.     Findings: Bruising present.     Comments: Left fore arm bruise   Neurological:     General: No focal deficit present.     Mental Status: She is alert. Mental status is at baseline.     Motor: No weakness.     Coordination: Coordination normal.     Gait: Gait abnormal.     Comments: Oriented to person and her room on unit  Psychiatric:        Mood and Affect: Mood normal.        Behavior: Behavior normal.     Labs reviewed: Recent Labs    07/11/18 2243  07/17/18 0000 08/01/18 0000  03/12/19 05/25/19 06/22/19  NA 142  --  142 140   < > 142 138 140  K 4.3  --  5.1 4.8   < > 4.2 4.4 4.9  CL 108  --  110 111*  --   --   --  108  CO2  --   --  22 22  --   --   --  27  GLUCOSE 95  --  104* 102*  --   --   --   --   BUN 26*  --  33* 38*   < > 27* 37* 26*  CREATININE 1.80*   < > 1.92* 2.12*   < > 1.7* 1.8* 1.8*  CALCIUM  --   --  9.2 9.6  --   --   --  9.3   < > = values in this interval not displayed.   Recent Labs    07/17/18 0000 03/12/19 05/25/19  AST 18 16 30   ALT 19 16 43*  BILITOT 0.4  --   --   PROT 6.0*  --   --    Recent Labs    07/17/18 0000 11/06/18 11/20/18 03/12/19 05/25/19 06/22/19  WBC 8.0 10.1 9.2  --   --  9.7  NEUTROABS 6,072  --   --   --   --   --  HGB 10.7* 11.0* 10.7* 11.7* 10.1* 10.4*  HCT 32.6* 33* 33*  33* 36 30* 32*  MCV 95.6  --   --   --   --   --   PLT 325 308 322 320 288 272   Lab Results  Component Value Date   TSH 3.16 04/03/2019   Lab Results  Component Value Date   HGBA1C 5.3 01/31/2018   Lab Results  Component Value Date   CHOL 152 07/17/2018   HDL 68 07/17/2018   LDLCALC 66  07/17/2018   TRIG 94 07/17/2018   CHOLHDL 2.2 07/17/2018    Significant Diagnostic Results in last 30 days:  No results found.  Assessment/Plan Essential hypertension Blood pressure is controlled, continue Metoprolol 12.5mg  qd  Gastroesophageal reflux disease without esophagitis Stable, continue Omeprazole 20mg  qd  Hypothyroidism Stable, last TSN wnl 3.16 03/2019, continue Levothyroxine 13mcg qd  CKD (chronic kidney disease) stage 3, GFR 30-59 ml/min (HCC) Baseline creat 1.8  Anemia, chronic disease Stable, no active bleed, continue Fe daily, last Hgb 10.4 06/22/19  Bipolar disorder ([place]) mood is stable, continue Lithium 150mg  qd, Duloxetine 60mg  qd, Wellbutrin 450mg  qd.      Family/ staff Communication: plan of care reviewed with the patient and charge nure  Labs/tests ordered:  none  Time spend 40 minutes

## 2019-06-30 NOTE — Assessment & Plan Note (Signed)
Stable, last TSN wnl 3.16 03/2019, continue Levothyroxine 12mcg qd

## 2019-06-30 NOTE — Assessment & Plan Note (Signed)
Blood pressure is controlled, continue Metoprolol 12.5mg  qd

## 2019-06-30 NOTE — Assessment & Plan Note (Signed)
Stable, no active bleed, continue Fe daily, last Hgb 10.4 06/22/19

## 2019-06-30 NOTE — Assessment & Plan Note (Signed)
Baseline creat 1.8

## 2019-06-30 NOTE — Assessment & Plan Note (Signed)
Stable, continue Omeprazole 20mg qd.  

## 2019-07-27 ENCOUNTER — Encounter: Payer: Self-pay | Admitting: Internal Medicine

## 2019-07-27 ENCOUNTER — Non-Acute Institutional Stay: Payer: Medicare Other | Admitting: Internal Medicine

## 2019-07-27 DIAGNOSIS — F319 Bipolar disorder, unspecified: Secondary | ICD-10-CM

## 2019-07-27 DIAGNOSIS — N183 Chronic kidney disease, stage 3 unspecified: Secondary | ICD-10-CM

## 2019-07-27 DIAGNOSIS — I10 Essential (primary) hypertension: Secondary | ICD-10-CM | POA: Diagnosis not present

## 2019-07-27 DIAGNOSIS — L84 Corns and callosities: Secondary | ICD-10-CM

## 2019-07-27 NOTE — Progress Notes (Signed)
Location: [facility] Room Number: 20 Place of Service:  ALF (13)  Provider:   Code Status:  Goals of Care:  Advanced Directives 06/01/2019  Does Patient Have a Medical Advance Directive? Yes  Type of Paramedic of [place];Out of facility DNR (pink MOST or yellow form)  Does patient want to make changes to medical advance directive? No - Patient declined  Copy of [place] in Chart? Yes - validated most recent copy scanned in chart (See row information)  Would patient like information on creating a medical advance directive? -  Pre-existing out of facility DNR order (yellow form or pink MOST form) Yellow form placed in chart (order not valid for inpatient use);Pink MOST form placed in chart (order not valid for inpatient use)     Chief Complaint  Patient presents with  . Acute Visit    HPI: Patient is a 85 y.o. female seen today for an acute visit for infected right foot callus  Patient has a history of hypertension, hypothyroidism, Hyperlipidemia, CKDstage 3,GERD, Anemia,mild cognitive impairment,falls And Bipolar Illness follows with Outsidepsychiatrist  Patient was seen today as she had been complaining of increased pain in her right foot.  Some swelling and redness around her callus. Patient has calluses in both her feet.  She was seen by a podiatrist in July 20.  He had shaved it but not completely.  Had started her on  Keflex and Silvadene Today patient was having increasing pain in the nurses noticed some swelling around the callus.  No discharge.  Past Medical History:  Diagnosis Date  . Anxiety and depression   . Aortic valve disease   . BBB (bundle branch block)    left, chronic  . Bipolar 1 disorder ([place])   . CKD (chronic kidney disease), stage III ([place])   . GERD (gastroesophageal reflux disease)   . Herpes zoster   . Hiatal hernia   . Hyperlipidemia   . Hypertension   . Hypertensive CKD  (chronic kidney disease)   . Hypothyroid   . Osteoarthritis   . Osteopenia   . Renal artery stenosis (HCC)    left    Past Surgical History:  Procedure Laterality Date  . CARDIAC CATHETERIZATION  03/21/04   essentially normal coronary arteries with moderate decrease in left ventricular function, 80% ostial left renal artery stenosis  . COLONOSCOPY  11/11/2006   Dr. Scarlette Shorts , diverticulosis, colon polyps  . [place]  . PANENDOSCOPY  11/11/2006   Dr. Scarlette Shorts, esophageal stricture s/p balloon dilation, GERD, Zenker's Diverticulum    No Known Allergies  Outpatient Encounter Medications as of 07/27/2019  Medication Sig  . aspirin 81 MG chewable tablet Chew 81 mg by mouth daily.  Marland Kitchen buPROPion (WELLBUTRIN XL) 150 MG 24 hr tablet Take 450 mg by mouth daily. 3 tabs  . cholecalciferol (VITAMIN D3) 25 MCG (1000 UT) tablet Take 1,000 Units by mouth daily.  Marland Kitchen doxycycline (DORYX) 100 MG EC tablet Take 100 mg by mouth 2 (two) times daily. x 7 days for right foot callous  . DULoxetine (CYMBALTA) 60 MG capsule Take 60 mg by mouth at bedtime.   Water engineer Bandages & Supports (MEDICAL COMPRESSION STOCKINGS) MISC Apply compression stockings to both legs in morning and remove it at bedtime for leg edema  . ferrous sulfate 220 (44 Fe) MG/5ML solution Take 220 mg by mouth daily. 3.58ml once a morning Mon,Wed, fRI  . L-Methylfolate (DEPLIN)  7.5 MG TABS Take 1 tablet by mouth daily.   Marland Kitchen levothyroxine (SYNTHROID, LEVOTHROID) 50 MCG tablet TAKE ONE TABLET BY MOUTH EVERY MORNING ON AN EMPTY STOMACH FOR THYROID  . lithium carbonate 150 MG capsule Take 150 mg by mouth daily.  Marland Kitchen loperamide (IMODIUM A-D) 2 MG tablet 1/2 to 1 tablet daily as needed to control diarrhea if you have 3 or more loose stool.  . metoprolol succinate (TOPROL-XL) 25 MG 24 hr tablet Take 0.5 tablets (12.5 mg total) by mouth daily. (Patient taking differently: Take 12.5 mg by mouth 2 (two) times a day. )  . omeprazole  (PRILOSEC) 20 MG capsule Take 1 capsule (20 mg total) by mouth daily.  . paliperidone (INVEGA) 3 MG 24 hr tablet Take 3 mg by mouth daily.  . Psyllium (METAMUCIL FIBER PO) 1 tablespoon by mouth once a morning. mix with water or juice  . saccharomyces boulardii (FLORASTOR) 250 MG capsule Take 250 mg by mouth 2 (two) times daily.  . simvastatin (ZOCOR) 5 MG tablet TAKE ONE TABLET BY MOUTH EVERY NIGHT AT BEDTIME   No facility-administered encounter medications on file as of 07/27/2019.     Review of Systems:  Review of Systems  Constitutional: Negative.   HENT: Negative.   Respiratory: Negative.   Cardiovascular: Positive for leg swelling.  Genitourinary: Negative.   Musculoskeletal: Positive for gait problem.  Skin: Negative.   Neurological: Positive for weakness.  Psychiatric/Behavioral: Positive for confusion.    Health Maintenance  Topic Date Due  . DEXA SCAN  10/04/1990  . INFLUENZA VACCINE  06/27/2019  . TETANUS/TDAP  09/25/2029 (Originally 08/03/2018)  . PNA vac Low Risk Adult  Completed    Physical Exam: Vitals:   07/27/19 1645  BP: (!) 115/53  Pulse: 67  Resp: 20  Temp: 98.5 F (36.9 C)  SpO2: 93%  Weight: 132 lb 9.6 oz (60.1 kg)  Height: 4\' 8"  (1.422 m)   Body mass index is 29.73 kg/m. Physical Exam Vitals signs reviewed.  Constitutional:      Appearance: Normal appearance.  HENT:     Head: Normocephalic.     Nose: Nose normal.     Mouth/Throat:     Mouth: Mucous membranes are moist.     Pharynx: Oropharynx is clear.  Eyes:     Pupils: Pupils are equal, round, and reactive to light.  Neck:     Musculoskeletal: Neck supple.  Cardiovascular:     Rate and Rhythm: Normal rate and regular rhythm.     Pulses: Normal pulses.  Pulmonary:     Effort: Pulmonary effort is normal.     Breath sounds: Normal breath sounds.  Abdominal:     General: Abdomen is flat. Bowel sounds are normal.     Palpations: Abdomen is soft.  Musculoskeletal:        General:  Swelling present.     Comments: Right Foot has callus which was tender and had swelling around it with redness Also has Callus in Left foot  Skin:    General: Skin is warm.  Neurological:     General: No focal deficit present.     Mental Status: She is alert.  Psychiatric:        Mood and Affect: Mood normal.        Thought Content: Thought content normal.        Judgment: Judgment normal.     Labs reviewed: Basic Metabolic Panel: Recent Labs    08/01/18 0000  11/20/18 03/12/19 04/03/19  05/25/19 06/22/19  NA 140   < > 141 142  --  138 140  K 4.8   < >  --  4.2  --  4.4 4.9  CL 111*  --   --   --   --   --  108  CO2 22  --   --   --   --   --  27  GLUCOSE 102*  --   --   --   --   --   --   BUN 38*   < > 30* 27*  --  37* 26*  CREATININE 2.12*   < > 1.7* 1.7*  --  1.8* 1.8*  CALCIUM 9.6  --   --   --   --   --  9.3  TSH  --   --  2.44  --  3.16  --   --    < > = values in this interval not displayed.   Liver Function Tests: Recent Labs    03/12/19 05/25/19  AST 16 30  ALT 16 43*   No results for input(s): LIPASE, AMYLASE in the last 8760 hours. No results for input(s): AMMONIA in the last 8760 hours. CBC: Recent Labs    11/06/18 11/20/18 03/12/19 05/25/19 06/22/19  WBC 10.1 9.2  --   --  9.7  HGB 11.0* 10.7* 11.7* 10.1* 10.4*  HCT 33* 33*  33* 36 30* 32*  PLT 308 322 320 288 272   Lipid Panel: No results for input(s): CHOL, HDL, LDLCALC, TRIG, CHOLHDL, LDLDIRECT in the last 8760 hours. Lab Results  Component Value Date   HGBA1C 5.3 01/31/2018    Procedures since last visit: No results found.  Assessment/Plan Right Foot Callus ? Possible Reinfection Doxycyline for 7 days Salicylic Acid patches to soften it Refer to podiatry again    Other Issues  Essential hypertension - Plan:  BP stable on Metoprolol  Dysphagia, unspecified type - Plan:  Stable Meds getting Crushed now  Hypothyroidism due to acquired atrophy of thyroid - Plan:  TSH was  normal in 5/20 Same dose for now  CKD (chronic kidney disease) stage 3, GFR 30-59 ml/min (HCC) - Plan:  Creat Stable  Mild cognitive impairment - Plan:  Continue Supportive Care  Pure hypercholesterolemia - Plan:  On Low dose of Statin  Bipolar affective disorder,  - Plan:  Doins well on Lithium, Cymbalta and Wellbutrin and Invega Would not change these meds as prescribed by the Psychatrist Nurses will follow up with Psychiatrist if she continues to have issues with these Meds  Ankle edema, bilateral - Plan:  Continue Ted hoses  Anemia, chronic disease - Plan:  Change her Iron to Liquid and 2/week with meals Vit D Change to 1000 units Q d for easy swolling    Labs/tests ordered:  * No order type specified * Next appt:  Visit date not found Total time spent in this patient care encounter was  25_  minutes; greater than 50% of the visit spent counseling patient and staff, reviewing records , Labs and coordinating care for problems addressed at this encounter.

## 2019-08-17 ENCOUNTER — Non-Acute Institutional Stay: Payer: Medicare Other | Admitting: Internal Medicine

## 2019-08-17 ENCOUNTER — Encounter: Payer: Self-pay | Admitting: Internal Medicine

## 2019-08-17 DIAGNOSIS — J3489 Other specified disorders of nose and nasal sinuses: Secondary | ICD-10-CM

## 2019-08-17 DIAGNOSIS — F319 Bipolar disorder, unspecified: Secondary | ICD-10-CM

## 2019-08-17 DIAGNOSIS — R251 Tremor, unspecified: Secondary | ICD-10-CM

## 2019-08-17 NOTE — Progress Notes (Signed)
Location:  [facility] Room Number: 20 Place of Service:  ALF [phone]) Provider:  Veleta Miners  MD  Mast, Man X, NP  Patient Care Team: Mast, Man X, NP as PCP - General (Nurse Practitioner) Lorretta Harp, MD as Consulting Physician (Cardiology) Aquilla Hacker, MD as Referring Physician (Psychiatry) Irene Shipper, MD as Consulting Physician (Gastroenterology) Newton Pigg, MD as Consulting Physician (Obstetrics and Gynecology)  Extended Emergency Contact Information Primary Emergency Contact: Hobbs,Diane Address: [address]          [place], [place] [address] [name] of [place] Phone: [phone] Mobile Phone: [phone] Relation: Daughter  Code Status:  DNR Goals of care: Advanced Directive information Advanced Directives 08/17/2019  Does Patient Have a Medical Advance Directive? Yes  Type of Advance Directive Out of facility DNR (pink MOST or yellow form)  Does patient want to make changes to medical advance directive? No - Patient declined  Copy of [place] in Chart? -  Would patient like information on creating a medical advance directive? -  Pre-existing out of facility DNR order (yellow form or pink MOST form) -     Chief Complaint  Patient presents with  . Acute Visit    c/o - Tremor    HPI:  Pt is a 85 y.o. female seen today for an acute visit for New onset Tremors   Patient has a history of hypertension, hypothyroidism, Hyperlipidemia, CKDstage 3,GERD, Anemia,mild cognitive impairment,falls And Bipolar Illness follows with Outside psychiatrist  Per nurses they have noticed tremor in patient's both Hands.  Tremor got so bad that patient was unable to feed herself and was dropping food. Patient denied any complaints.  She has cognitive impairment but is independent in her ADLs and lives in [place].  The Nurses Are Concerned That Patient Is Having a Side Effect of Lithium Patient has been on lithium  for many years and has been managed by the psychiatrist.  Her daughter does not want [place] to change the lithium Patient herself only complaint of runny nose with clear mucus.  Denies any coughing or shortness of breath or fever  Past Medical History:  Diagnosis Date  . Anxiety and depression   . Aortic valve disease   . BBB (bundle branch block)    left, chronic  . Bipolar 1 disorder ([place])   . CKD (chronic kidney disease), stage III ([place])   . GERD (gastroesophageal reflux disease)   . Herpes zoster   . Hiatal hernia   . Hyperlipidemia   . Hypertension   . Hypertensive CKD (chronic kidney disease)   . Hypothyroid   . Osteoarthritis   . Osteopenia   . Renal artery stenosis (H[facility])    left   Past Surgical History:  Procedure Laterality Date  . CARDIAC CATHETERIZATION  03/21/04   essentially normal coronary arteries with moderate decrease in left ventricular function, 80% ostial left renal artery stenosis  . COLONOSCOPY  11/11/2006   Dr. Scarlette Shorts , diverticulosis, colon polyps  . [place]  . PANENDOSCOPY  11/11/2006   Dr. Scarlette Shorts, esophageal stricture s/p balloon dilation, GERD, Zenker's Diverticulum    No Known Allergies  Outpatient Encounter Medications as of 08/17/2019  Medication Sig  . aspirin 81 MG chewable tablet Chew 81 mg by mouth daily.  Marland Kitchen buPROPion (WELLBUTRIN XL) 150 MG 24 hr tablet Take 450 mg by mouth daily. 3 tabs  . cholecalciferol (VITAMIN D3) 25 MCG (1000 UT)  tablet Take 1,000 Units by mouth daily.  . DULoxetine (CYMBALTA) 60 MG capsule Take 60 mg by mouth daily.   . ferrous sulfate 220 (44 Fe) MG/5ML solution Take 220 mg by mouth daily. 3.64ml once a morning Mon,Wed, fRI  . L-Methylfolate (DEPLIN) 7.5 MG TABS Take 1 tablet by mouth daily.   Marland Kitchen levothyroxine (SYNTHROID, LEVOTHROID) 50 MCG tablet TAKE ONE TABLET BY MOUTH EVERY MORNING ON AN EMPTY STOMACH FOR THYROID  . lithium carbonate 150 MG capsule Take 150 mg by mouth daily.  Marland Kitchen loperamide  (IMODIUM A-D) 2 MG tablet 1/2 to 1 tablet daily as needed to control diarrhea if you have 3 or more loose stool.  . metoprolol tartrate (LOPRESSOR) 12.5 mg TABS tablet Take 12.5 mg by mouth 2 (two) times daily.  Marland Kitchen omeprazole (PRILOSEC) 20 MG capsule Take 1 capsule (20 mg total) by mouth daily.  . paliperidone (INVEGA) 3 MG 24 hr tablet Take 3 mg by mouth daily.  . Psyllium (METAMUCIL FIBER PO) 1 tablespoon by mouth once a morning. mix with water or juice  . simvastatin (ZOCOR) 5 MG tablet TAKE ONE TABLET BY MOUTH EVERY NIGHT AT BEDTIME  . [DISCONTINUED] Elastic Bandages & Supports (MEDICAL COMPRESSION STOCKINGS) MISC Apply compression stockings to both legs in morning and remove it at bedtime for leg edema  . [DISCONTINUED] metoprolol succinate (TOPROL-XL) 25 MG 24 hr tablet Take 0.5 tablets (12.5 mg total) by mouth daily. (Patient taking differently: Take 12.5 mg by mouth 2 (two) times a day. )   No facility-administered encounter medications on file as of 08/17/2019.     Review of Systems  HENT: Positive for rhinorrhea.   Respiratory: Negative.   Cardiovascular: Negative.   Gastrointestinal: Negative.   Genitourinary: Negative.   Neurological: Positive for tremors.  Psychiatric/Behavioral: Positive for confusion.    Immunization History  Administered Date(s) Administered  . Influenza,inj,Quad PF,6+ Mos 08/27/2018  . Influenza-Unspecified 08/09/2009, 08/28/2010, 09/11/2011, 09/09/2012, 08/27/2013, 11/26/2013, 09/07/2014, 08/27/2015, 08/08/2016  . Pneumococcal Conjugate-13 06/10/2014  . Pneumococcal Polysaccharide-23 01/16/2010, 08/28/2010, 09/11/2011  . Pneumococcal-Unspecified 08/27/2015  . Td 08/03/2008  . Zoster 01/16/2010, 07/30/2011   Pertinent  Health Maintenance Due  Topic Date Due  . DEXA SCAN  10/04/1990  . INFLUENZA VACCINE  06/27/2019  . PNA vac Low Risk Adult  Completed   Fall Risk  08/13/2018 07/16/2018 01/17/2018 11/07/2016 07/10/2016  Falls in the past year? Yes Yes  Yes No Yes  Number falls in past yr: 1 2 or more 2 or more - 1  Injury with Fall? No Yes Yes - Yes  Comment - Patient has a bruise to her right forearm and shoulder.  broken ribs - -  Risk Factor Category  - High Fall Risk - - -  Risk for fall due to : - Impaired balance/gait;History of fall(s) - - -  Follow up - Follow up appointment - - -   Functional Status Survey:    Vitals:   08/17/19 0950  BP: 132/66  Pulse: 70  Resp: 16  Temp: (!) 97.1 F (36.2 C)  SpO2: 92%  Weight: 124 lb 9.6 oz (56.5 kg)  Height: 4\' 8"  (1.422 m)   Body mass index is 27.93 kg/m. Physical Exam Vitals signs reviewed.  Constitutional:      Appearance: Normal appearance.  HENT:     Head: Normocephalic.     Nose: Nose normal.     Mouth/Throat:     Mouth: Mucous membranes are moist.     Pharynx: Oropharynx  is clear.  Eyes:     Pupils: Pupils are equal, round, and reactive to light.  Neck:     Musculoskeletal: Neck supple.  Cardiovascular:     Rate and Rhythm: Normal rate and regular rhythm.     Pulses: Normal pulses.  Pulmonary:     Effort: Pulmonary effort is normal.     Breath sounds: Normal breath sounds.  Abdominal:     General: Abdomen is flat. Bowel sounds are normal.     Palpations: Abdomen is soft.  Neurological:     General: No focal deficit present.     Mental Status: She is alert.     Comments: I did not see any Tremors in her hands  Psychiatric:        Mood and Affect: Mood normal.        Thought Content: Thought content normal.     Labs reviewed: Recent Labs    03/12/19 05/25/19 06/22/19  NA 142 138 140  K 4.2 4.4 4.9  CL  --   --  108  CO2  --   --  27  BUN 27* 37* 26*  CREATININE 1.7* 1.8* 1.8*  CALCIUM  --   --  9.3   Recent Labs    03/12/19 05/25/19  AST 16 30  ALT 16 43*   Recent Labs    11/06/18 11/20/18 03/12/19 05/25/19 06/22/19  WBC 10.1 9.2  --   --  9.7  HGB 11.0* 10.7* 11.7* 10.1* 10.4*  HCT 33* 33*  33* 36 30* 32*  PLT 308 322 320 288 272    Lab Results  Component Value Date   TSH 3.16 04/03/2019   Lab Results  Component Value Date   HGBA1C 5.3 01/31/2018   Lab Results  Component Value Date   CHOL 152 07/17/2018   HDL 68 07/17/2018   LDLCALC 66 07/17/2018   TRIG 94 07/17/2018   CHOLHDL 2.2 07/17/2018    Significant Diagnostic Results in last 30 days:  No results found.  Assessment/Plan Hand Tremors I did not appreciate any tremors in her hand. We will not change the dose of lithium right now Check lithium level Also check the TSH CBC and a BMP Allergic rhinitis We will start her on Claritin 10 mg daily for few weeks  Essential hypertension - Plan:  Stable on metoprolol  Hypothyroidism due to acquired atrophy of thyroid - Plan:  TSH was normal in 5/20 Same dose for now Check TSH  CKD (chronic kidney disease) stage 3, GFR 30-59 ml/min (HCC) - Plan:  Creat Stable  Mild cognitive impairment - Plan:  Continue Supportive Care  Pure hypercholesterolemia - Plan:  On Low dose of Statin  Bipolar affective disorder,  - Plan:  Doins well on Lithium, Cymbalta and Wellbutrin and Invega Would not change these meds as prescribed by the Psychatrist Check Lithium Level  Ankle edema, bilateral - Plan:  Continue Ted hoses  Anemia, chronic disease - Plan:  Change her Iron to Liquid and 3/week with meals Vit D Change to 1000 units Q d for easy swolling   Family/ staff Communication:   Labs/tests ordered:

## 2019-08-20 LAB — BASIC METABOLIC PANEL
BUN: 20 (ref 4–21)
CO2: 23 — AB (ref 13–22)
Chloride: 107 (ref 99–108)
Creatinine: 1.7 — AB (ref 0.5–1.1)
Glucose: 83
Potassium: 4 (ref 3.4–5.3)
Sodium: 141 (ref 137–147)

## 2019-08-20 LAB — COMPREHENSIVE METABOLIC PANEL
Albumin: 3.6 (ref 3.5–5.0)
Calcium: 9.5 (ref 8.7–10.7)
Globulin: 2

## 2019-08-20 LAB — TSH: TSH: 1.82 (ref 0.41–5.90)

## 2019-08-20 LAB — HEPATIC FUNCTION PANEL
ALT: 19 (ref 7–35)
AST: 19 (ref 13–35)
Alkaline Phosphatase: 46 (ref 25–125)
Bilirubin, Total: 0.3

## 2019-08-20 LAB — CBC: RBC: 3.43 — AB (ref 3.87–5.11)

## 2019-08-20 LAB — CBC AND DIFFERENTIAL
HCT: 33 — AB (ref 36–46)
Hemoglobin: 10.6 — AB (ref 12.0–16.0)
Neutrophils Absolute: 7110
Platelets: 341 (ref 150–399)
WBC: 9

## 2019-09-23 ENCOUNTER — Encounter: Payer: Self-pay | Admitting: Internal Medicine

## 2019-09-23 ENCOUNTER — Non-Acute Institutional Stay: Payer: Medicare Other | Admitting: Internal Medicine

## 2019-09-23 DIAGNOSIS — D638 Anemia in other chronic diseases classified elsewhere: Secondary | ICD-10-CM

## 2019-09-23 DIAGNOSIS — F319 Bipolar disorder, unspecified: Secondary | ICD-10-CM

## 2019-09-23 DIAGNOSIS — R2681 Unsteadiness on feet: Secondary | ICD-10-CM

## 2019-09-23 DIAGNOSIS — E034 Atrophy of thyroid (acquired): Secondary | ICD-10-CM | POA: Diagnosis not present

## 2019-09-23 DIAGNOSIS — I10 Essential (primary) hypertension: Secondary | ICD-10-CM

## 2019-09-23 DIAGNOSIS — R1310 Dysphagia, unspecified: Secondary | ICD-10-CM

## 2019-09-23 DIAGNOSIS — M25472 Effusion, left ankle: Secondary | ICD-10-CM

## 2019-09-23 DIAGNOSIS — N184 Chronic kidney disease, stage 4 (severe): Secondary | ICD-10-CM

## 2019-09-23 DIAGNOSIS — L84 Corns and callosities: Secondary | ICD-10-CM

## 2019-09-23 DIAGNOSIS — M25471 Effusion, right ankle: Secondary | ICD-10-CM

## 2019-09-23 NOTE — Progress Notes (Addendum)
[facility] Room Number: 20 Place of Service:  ALF (13) Provider:  Veleta Miners MD  Mast, Man X, NP  Patient Care Team: Mast, Man X, NP as PCP - General (Nurse Practitioner) Lorretta Harp, MD as Consulting Physician (Cardiology) Aquilla Hacker, MD as Referring Physician (Psychiatry) Irene Shipper, MD as Consulting Physician (Gastroenterology) Newton Pigg, MD as Consulting Physician (Obstetrics and Gynecology)  Extended Emergency Contact Information Primary Emergency Contact: Hobbs,Diane Address: [address]          [place], [place] [address] [name] of [place] Phone: [phone] Mobile Phone: [phone] Relation: Daughter  Code Status:  DNR Goals of care: Advanced Directive information Advanced Directives 08/17/2019  Does Patient Have a Medical Advance Directive? Yes  Type of Advance Directive Out of facility DNR (pink MOST or yellow form)  Does patient want to make changes to medical advance directive? No - Patient declined  Copy of [place] in Chart? -  Would patient like information on creating a medical advance directive? -  Pre-existing out of facility DNR order (yellow form or pink MOST form) -     Chief Complaint  Patient presents with  . Acute Visit    Change in condition with requiring more help    HPI:  Pt is a 85 y.o. female seen today for an acute visit for requiring more help for her ADLs.  Patient will be transferred to SNF  Patient has a history of hypertension, hypothyroidism, Hyperlipidemia, CKDstage 3,GERD, Anemia,mild cognitive impairment,falls And Bipolar Illness follows with Outside psychiatrist  Per nurses patient has been slowly getting more dependent for her ADLs.  She has lost some weight as she is not able to feed herself.  She has had number of falls.  Is not able to walk with a walker. Patient herself did not have any complaints She denied any pain fever cough or dysuria  Patient does have h/o Bipolar illness but does not have any Behavior Issues and has been stable on Her Lithium and Deplin and Invega  Past Medical History:  Diagnosis Date  . Anxiety and depression   . Aortic valve disease   . BBB (bundle branch block)    left, chronic  . Bipolar 1 disorder ([place])   . CKD (chronic kidney disease), stage III   . GERD (gastroesophageal reflux disease)   . Herpes zoster   . Hiatal hernia   . Hyperlipidemia   . Hypertension   . Hypertensive CKD (chronic kidney disease)   . Hypothyroid   . Osteoarthritis   . Osteopenia   . Renal artery stenosis ([facility])    left   Past Surgical History:  Procedure Laterality Date  . CARDIAC CATHETERIZATION  03/21/04   essentially normal coronary arteries with moderate decrease in left ventricular function, 80% ostial left renal artery stenosis  . COLONOSCOPY  11/11/2006   Dr. Scarlette Shorts , diverticulosis, colon polyps  . [place]  . PANENDOSCOPY  11/11/2006   Dr. Scarlette Shorts, esophageal stricture s/p balloon dilation, GERD, Zenker's Diverticulum    No Known Allergies  Allergies as of 09/23/2019   No Known Allergies     Medication List       Accurate as of September 23, 2019  8:43 AM. If you have any questions, ask your nurse or doctor.        acetaminophen 325 MG tablet Commonly known as: TYLENOL Take 650 mg by mouth. Administer 650mg  every four  hours PRN for minor pain/headache x 48 hours. Notify MD of continued pain/headache. Do not exceed 3,000 mg in 24 hours. Document area of discomfort and effectiveness of medication. Every 4 Hours - PRN   aspirin 81 MG chewable tablet Chew 81 mg by mouth daily.   buPROPion 150 MG 24 hr tablet Commonly known as: WELLBUTRIN XL Take 450 mg by mouth daily. 3 tabs   cholecalciferol 25 MCG (1000 UT) tablet Commonly known as: VITAMIN D3 Take 1,000 Units by mouth daily.   Deplin 7.5 MG Tabs Take 1 tablet by mouth daily.   DULoxetine 60 MG capsule  Commonly known as: CYMBALTA Take 60 mg by mouth daily.   ferrous sulfate 220 (44 Fe) MG/5ML solution Take 220 mg by mouth daily. 3.33ml once a morning Mon,Wed, fRI   levothyroxine 50 MCG tablet Commonly known as: SYNTHROID TAKE ONE TABLET BY MOUTH EVERY MORNING ON AN EMPTY STOMACH FOR THYROID   lithium carbonate 150 MG capsule Take 150 mg by mouth daily.   loperamide 2 MG tablet Commonly known as: Imodium A-D 1/2 to 1 tablet daily as needed to control diarrhea if you have 3 or more loose stool.   METAMUCIL FIBER PO 1 tablespoon by mouth once a morning. mix with water or juice   metoprolol tartrate 12.5 mg Tabs tablet Commonly known as: LOPRESSOR Take 12.5 mg by mouth 2 (two) times daily.   omeprazole 20 MG capsule Commonly known as: PRILOSEC Take 1 capsule (20 mg total) by mouth daily.   paliperidone 3 MG 24 hr tablet Commonly known as: INVEGA Take 3 mg by mouth daily.   simvastatin 5 MG tablet Commonly known as: ZOCOR TAKE ONE TABLET BY MOUTH EVERY NIGHT AT BEDTIME       Review of Systems  Cardiovascular: Positive for leg swelling.  Neurological: Positive for weakness.  Psychiatric/Behavioral: Positive for confusion.  All other systems reviewed and are negative.   Immunization History  Administered Date(s) Administered  . Influenza,inj,Quad PF,6+ Mos 08/27/2018  . Influenza-Unspecified 08/09/2009, 08/28/2010, 09/11/2011, 09/09/2012, 08/27/2013, 11/26/2013, 09/07/2014, 08/27/2015, 08/08/2016  . Pneumococcal Conjugate-13 06/10/2014  . Pneumococcal Polysaccharide-23 01/16/2010, 08/28/2010, 09/11/2011  . Pneumococcal-Unspecified 08/27/2015  . Td 08/03/2008  . Zoster 01/16/2010, 07/30/2011   Pertinent  Health Maintenance Due  Topic Date Due  . DEXA SCAN  10/04/1990  . INFLUENZA VACCINE  Completed  . PNA vac Low Risk Adult  Completed   Fall Risk  08/13/2018 07/16/2018 01/17/2018 11/07/2016 07/10/2016  Falls in the past year? Yes Yes Yes No Yes  Number falls in  past yr: 1 2 or more 2 or more - 1  Injury with Fall? No Yes Yes - Yes  Comment - Patient has a bruise to her right forearm and shoulder.  broken ribs - -  Risk Factor Category  - High Fall Risk - - -  Risk for fall due to : - Impaired balance/gait;History of fall(s) - - -  Follow up - Follow up appointment - - -   Functional Status Survey:    Vitals:   09/23/19 0829  BP: (!) 160/72  Pulse: (!) 56  Resp: 16  Temp: 98.2 F (36.8 C)  SpO2: 95%  Weight: 124 lb 9.6 oz (56.5 kg)  Height: 4\' 8"  (1.422 m)   Body mass index is 27.93 kg/m. Physical Exam  Constitutional: Well-developed and well-nourished.  HENT:  Head: Normocephalic.  Mouth/Throat: Oropharynx is clear and moist.  Eyes: Pupils are equal, round, and reactive to light.  Neck: Neck  supple.  Cardiovascular: Normal rate and normal heart sounds.  No murmur heard. Pulmonary/Chest: Effort normal and breath sounds normal. No respiratory distress. No wheezes. She has no rales.  Abdominal: Soft. Bowel sounds are normal. No distension. There is no tenderness. There is no rebound.  Musculoskeletal: Mild edema Bilateral Lymphadenopathy: none Neurological: No Focal Deficit but was very unsteady when she stood without her Walker Skin: Skin is warm and dry.  Psychiatric: Normal mood and affect. Behavior is normal. Thought content normal.    Labs reviewed: Recent Labs    03/12/19 05/25/19 06/22/19  NA 142 138 140  K 4.2 4.4 4.9  CL  --   --  108  CO2  --   --  27  BUN 27* 37* 26*  CREATININE 1.7* 1.8* 1.8*  CALCIUM  --   --  9.3   Recent Labs    03/12/19 05/25/19  AST 16 30  ALT 16 43*   Recent Labs    11/06/18 11/20/18 03/12/19 05/25/19 06/22/19  WBC 10.1 9.2  --   --  9.7  HGB 11.0* 10.7* 11.7* 10.1* 10.4*  HCT 33* 33*  33* 36 30* 32*  PLT 308 322 320 288 272   Lab Results  Component Value Date   TSH 3.16 04/03/2019   Lab Results  Component Value Date   HGBA1C 5.3 01/31/2018   Lab Results  Component  Value Date   CHOL 152 07/17/2018   HDL 68 07/17/2018   LDLCALC 66 07/17/2018   TRIG 94 07/17/2018   CHOLHDL 2.2 07/17/2018    Significant Diagnostic Results in last 30 days:  No results found.  Assessment/Plan Essential hypertension Bp Mildily Elevated today She is on Low dose of Lopressor  Unsteady gait Move to Higher Level of care  Hypothyroidism due to acquired atrophy of thyroid TSH Normal in 5/20  Ankle edema, bilateral Ted hoses for now  Bipolar affective disorder, r Does not have any behavior issues. Stays under Psych Care Stable on Lithium, Wellbutrin, Invega and Deplin Level has been normal  Anemia of chronic disease On Iron  Hgb stable  Foot callus Follows with Podiatry  CKD (chronic kidney disease) stage 4, GFR 15-29 ml/min (HCC) Creat staying Stable  Dysphagia, unspecified type Needs help with her meds crushed Hyperlipidemia On Statin  Cognitive Impairment Needs Higher level of Support as not able to do her ADLS    Family/ staff Communication:   Labs/tests ordered:   Total time spent in this patient care encounter was  40_  minutes; greater than 50% of the visit spent counseling patient and staff, reviewing records , Labs and coordinating care for problems addressed at this encounter.

## 2019-10-07 ENCOUNTER — Encounter: Payer: Self-pay | Admitting: Internal Medicine

## 2019-10-07 ENCOUNTER — Non-Acute Institutional Stay: Payer: Medicare Other | Admitting: Internal Medicine

## 2019-10-07 DIAGNOSIS — I10 Essential (primary) hypertension: Secondary | ICD-10-CM | POA: Diagnosis not present

## 2019-10-07 DIAGNOSIS — M545 Low back pain, unspecified: Secondary | ICD-10-CM

## 2019-10-07 DIAGNOSIS — W19XXXD Unspecified fall, subsequent encounter: Secondary | ICD-10-CM | POA: Diagnosis not present

## 2019-10-07 DIAGNOSIS — R2681 Unsteadiness on feet: Secondary | ICD-10-CM

## 2019-10-07 DIAGNOSIS — G3184 Mild cognitive impairment, so stated: Secondary | ICD-10-CM

## 2019-10-07 NOTE — Progress Notes (Signed)
Location:    [facility]ursing Home Room Number: 20 Place of Service:  ALF (13) Provider:  Veleta Miners MD  Mast, Man X, NP  Patient Care Team: Mast, Man X, NP as PCP - General (Nurse Practitioner) Lorretta Harp, MD as Consulting Physician (Cardiology) Aquilla Hacker, MD as Referring Physician (Psychiatry) Irene Shipper, MD as Consulting Physician (Gastroenterology) Newton Pigg, MD as Consulting Physician (Obstetrics and Gynecology)  Extended Emergency Contact Information Primary Emergency Contact: Hobbs,Diane Address: [address]          [place], [place] [address] [name] of [place] Phone: [phone] Mobile Phone: [phone] Relation: Daughter  Code Status:  DNR Goals of care: Advanced Directive information Advanced Directives 08/17/2019  Does Patient Have a Medical Advance Directive? Yes  Type of Advance Directive Out of facility DNR (pink MOST or yellow form)  Does patient want to make changes to medical advance directive? No - Patient declined  Copy of [place] in Chart? -  Would patient like information on creating a medical advance directive? -  Pre-existing out of facility DNR order (yellow form or pink MOST form) -     Chief Complaint  Patient presents with  . Acute Visit    Low back pain after fall    HPI:  Pt is a 85 y.o. female seen today for an acute visit for Low back Pain after sustaining Fall few days ago  Patient has a history of hypertension, hypothyroidism, Hyperlipidemia, CKDstage 3,GERD, Anemia,mild cognitive impairment,falls And Bipolar Illness follows with Outsidepsychiatrist  Patient lives in [place] and Plan is to move her to SNF. But few days ago she fell in the bathroom. It was unwitnessed fall. Patient is unable to give much history. But today Nurses noticed her to be c/o Low back pain in lumbar area with some swelling . Patient is sitting in her Recliner. Does not look in any distress. She says her back  hurts when she moves. Still Walking with the walker. Eating with no Nausea   Past Medical History:  Diagnosis Date  . Anxiety and depression   . Aortic valve disease   . BBB (bundle branch block)    left, chronic  . Bipolar 1 disorder ([place])   . CKD (chronic kidney disease), stage III   . GERD (gastroesophageal reflux disease)   . Herpes zoster   . Hiatal hernia   . Hyperlipidemia   . Hypertension   . Hypertensive CKD (chronic kidney disease)   . Hypothyroid   . Osteoarthritis   . Osteopenia   . Renal artery stenosis (HCC)    left   Past Surgical History:  Procedure Laterality Date  . CARDIAC CATHETERIZATION  03/21/04   essentially normal coronary arteries with moderate decrease in left ventricular function, 80% ostial left renal artery stenosis  . COLONOSCOPY  11/11/2006   Dr. Scarlette Shorts , diverticulosis, colon polyps  . [place]  . PANENDOSCOPY  11/11/2006   Dr. Scarlette Shorts, esophageal stricture s/p balloon dilation, GERD, Zenker's Diverticulum    No Known Allergies  Allergies as of 10/07/2019   No Known Allergies     Medication List       Accurate as of October 07, 2019 10:30 AM. If you have any questions, ask your nurse or doctor.        acetaminophen 325 MG tablet Commonly known as: TYLENOL Take 650 mg by mouth. Administer 650mg  every four hours PRN for minor pain/headache x 48 hours. Notify  MD of continued pain/headache. Do not exceed 3,000 mg in 24 hours. Document area of discomfort and effectiveness of medication. Every 4 Hours - PRN   aspirin 81 MG chewable tablet Chew 81 mg by mouth daily.   buPROPion 150 MG 24 hr tablet Commonly known as: WELLBUTRIN XL Take 450 mg by mouth daily. 3 tabs   cholecalciferol 25 MCG (1000 UT) tablet Commonly known as: VITAMIN D3 Take 1,000 Units by mouth daily.   Deplin 7.5 MG Tabs Take 1 tablet by mouth daily.   DULoxetine 60 MG capsule Commonly known as: CYMBALTA Take 60 mg by mouth daily.    ferrous sulfate 220 (44 Fe) MG/5ML solution Take 220 mg by mouth daily. 3.71ml once a morning Mon,Wed, fRI   levothyroxine 50 MCG tablet Commonly known as: SYNTHROID TAKE ONE TABLET BY MOUTH EVERY MORNING ON AN EMPTY STOMACH FOR THYROID   lithium carbonate 150 MG capsule Take 150 mg by mouth daily.   loperamide 2 MG tablet Commonly known as: Imodium A-D 1/2 to 1 tablet daily as needed to control diarrhea if you have 3 or more loose stool.   METAMUCIL FIBER PO 1 tablespoon by mouth once a morning. mix with water or juice   metoprolol tartrate 12.5 mg Tabs tablet Commonly known as: LOPRESSOR Take 12.5 mg by mouth 2 (two) times daily.   omeprazole 20 MG capsule Commonly known as: PRILOSEC Take 1 capsule (20 mg total) by mouth daily.   paliperidone 3 MG 24 hr tablet Commonly known as: INVEGA Take 3 mg by mouth daily.   simvastatin 5 MG tablet Commonly known as: ZOCOR TAKE ONE TABLET BY MOUTH EVERY NIGHT AT BEDTIME       Review of Systems  Unable to perform ROS: Dementia    Immunization History  Administered Date(s) Administered  . Influenza,inj,Quad PF,6+ Mos 08/27/2018  . Influenza-Unspecified 08/09/2009, 08/28/2010, 09/11/2011, 09/09/2012, 08/27/2013, 11/26/2013, 09/07/2014, 08/27/2015, 08/08/2016  . Pneumococcal Conjugate-13 06/10/2014  . Pneumococcal Polysaccharide-23 01/16/2010, 08/28/2010, 09/11/2011  . Pneumococcal-Unspecified 08/27/2015  . Td 08/03/2008  . Zoster 01/16/2010, 07/30/2011   Pertinent  Health Maintenance Due  Topic Date Due  . DEXA SCAN  10/04/1990  . INFLUENZA VACCINE  Completed  . PNA vac Low Risk Adult  Completed   Fall Risk  08/13/2018 07/16/2018 01/17/2018 11/07/2016 07/10/2016  Falls in the past year? Yes Yes Yes No Yes  Number falls in past yr: 1 2 or more 2 or more - 1  Injury with Fall? No Yes Yes - Yes  Comment - Patient has a bruise to her right forearm and shoulder.  broken ribs - -  Risk Factor Category  - High Fall Risk - - -   Risk for fall due to : - Impaired balance/gait;History of fall(s) - - -  Follow up - Follow up appointment - - -   Functional Status Survey:    Vitals:   10/07/19 1025  BP: (!) 180/65 Repeat was 140/68  Pulse: 71  Resp: 18  Temp: (!) 97.1 F (36.2 C)  SpO2: 96%  Weight: 124 lb 12.8 oz (56.6 kg)  Height: 4\' 8"  (1.422 m)   Body mass index is 27.98 kg/m. Physical Exam  Constitutional:Well-developed and well-nourished.  HENT:  Head: Normocephalic.  Mouth/Throat: Oropharynx is clear and moist.  Eyes: Pupils are equal, round, and reactive to light.  Neck: Neck supple.  Cardiovascular: Normal rate and normal heart sounds.  No murmur heard. Pulmonary/Chest: Effort normal and breath sounds normal. No respiratory distress. No  wheezes. She has no rales.  Abdominal: Soft. Bowel sounds are normal. No distension. There is no tenderness. There is no rebound.  Musculoskeletal: No edema. Has small swelling in her Lower back and Point tenderness on that site Lymphadenopathy: none Neurological: No Focal Deficits Skin: Skin is warm and dry.  Psychiatric: Normal mood and affect. Behavior is normal. Thought content normal.    Labs reviewed: Recent Labs    03/12/19 05/25/19 06/22/19  NA 142 138 140  K 4.2 4.4 4.9  CL  --   --  108  CO2  --   --  27  BUN 27* 37* 26*  CREATININE 1.7* 1.8* 1.8*  CALCIUM  --   --  9.3   Recent Labs    03/12/19 05/25/19  AST 16 30  ALT 16 43*   Recent Labs    11/06/18 11/20/18 03/12/19 05/25/19 06/22/19  WBC 10.1 9.2  --   --  9.7  HGB 11.0* 10.7* 11.7* 10.1* 10.4*  HCT 33* 33*  33* 36 30* 32*  PLT 308 322 320 288 272   Lab Results  Component Value Date   TSH 3.16 04/03/2019   Lab Results  Component Value Date   HGBA1C 5.3 01/31/2018   Lab Results  Component Value Date   CHOL 152 07/17/2018   HDL 68 07/17/2018   LDLCALC 66 07/17/2018   TRIG 94 07/17/2018   CHOLHDL 2.2 07/17/2018    Significant Diagnostic Results in last 30 days:   No results found.  Assessment/Plan Low Back pain with Point tenderness after the Fall Could be compression Fracture Will get Stat Xray of Lower Back Continue Pain control for now. She looks comfortable Plan is to move her to SNF for higher level of care due to her recurent Falls Essential Hypertension BP elevated again today Repeat Was 140/58 Will continue to monitor   Other issues  Unsteady gait Move to Higher Level of care  Hypothyroidism due to acquired atrophy of thyroid TSH Normal in 5/20  Ankle edema, bilateral Ted hoses for now  Bipolar affective disorder,  Does not have any behavior issues. Stays under Psych Care Stable on Lithium, Wellbutrin, Invega and Deplin Level has been normal  Anemia of chronic disease On Iron  Hgb stable  Foot callus Follows with Podiatry  CKD (chronic kidney disease) stage 4, GFR 15-29 ml/min (HCC) Creat staying Stable  Dysphagia, unspecified type Needs help with her meds crushed Hyperlipidemia On Statin  Cognitive Impairment Needs Higher level of Support as not able to do her ADLS  Family/ staff Communication:   Labs/tests ordered:  Total time spent in this patient care encounter was  25_  minutes; greater than 50% of the visit spent counseling  staff, reviewing records , Labs and coordinating care for problems addressed at this encounter.

## 2019-10-08 ENCOUNTER — Encounter: Payer: Self-pay | Admitting: Nurse Practitioner

## 2019-10-08 DIAGNOSIS — M545 Low back pain, unspecified: Secondary | ICD-10-CM | POA: Insufficient documentation

## 2019-10-09 ENCOUNTER — Encounter: Payer: Self-pay | Admitting: Nurse Practitioner

## 2019-10-09 ENCOUNTER — Non-Acute Institutional Stay (SKILLED_NURSING_FACILITY): Payer: Medicare Other | Admitting: Nurse Practitioner

## 2019-10-09 DIAGNOSIS — E034 Atrophy of thyroid (acquired): Secondary | ICD-10-CM

## 2019-10-09 DIAGNOSIS — K219 Gastro-esophageal reflux disease without esophagitis: Secondary | ICD-10-CM | POA: Diagnosis not present

## 2019-10-09 DIAGNOSIS — D638 Anemia in other chronic diseases classified elsewhere: Secondary | ICD-10-CM

## 2019-10-09 DIAGNOSIS — F319 Bipolar disorder, unspecified: Secondary | ICD-10-CM

## 2019-10-09 DIAGNOSIS — I10 Essential (primary) hypertension: Secondary | ICD-10-CM

## 2019-10-09 DIAGNOSIS — R634 Abnormal weight loss: Secondary | ICD-10-CM

## 2019-10-09 DIAGNOSIS — N1832 Chronic kidney disease, stage 3b: Secondary | ICD-10-CM

## 2019-10-09 NOTE — Assessment & Plan Note (Signed)
Chronic, baseline creat 1.7-1.8, last creat 1.72 08/20/19

## 2019-10-09 NOTE — Assessment & Plan Note (Signed)
Stable, continue Omeprazole 20mg qd.  

## 2019-10-09 NOTE — Assessment & Plan Note (Addendum)
Chronic, last Hgb 10.6 08/20/19, continue Fe daily supplement

## 2019-10-09 NOTE — Assessment & Plan Note (Signed)
Her mood is stable, continue Invega 3mg  qd, Lithium 150mg  qd, Duloxetine 60mg  qd, Wellbutrin 450mg  qd.

## 2019-10-09 NOTE — Progress Notes (Signed)
Location:   [facility] Room Number: 15 Place of Service:  SNF (31) Provider: Liborio Saccente X, NP  Sukhdeep Wieting X, NP  Patient Care Team: Aqsa Sensabaugh X, NP as PCP - General (Nurse Practitioner) Lorretta Harp, MD as Consulting Physician (Cardiology) Aquilla Hacker, MD as Referring Physician (Psychiatry) Irene Shipper, MD as Consulting Physician (Gastroenterology) Newton Pigg, MD as Consulting Physician (Obstetrics and Gynecology)  Extended Emergency Contact Information Primary Emergency Contact: Hobbs,Diane Address: [address]          [place], [place] [address] [name] of [place] Phone: [phone] Mobile Phone: [phone] Relation: Daughter  Code Status:  DNR Goals of care: Advanced Directive information Advanced Directives 08/17/2019  Does Patient Have a Medical Advance Directive? Yes  Type of Advance Directive Out of facility DNR (pink MOST or yellow form)  Does patient want to make changes to medical advance directive? No - Patient declined  Copy of [place] in Chart? -  Would patient like information on creating a medical advance directive? -  Pre-existing out of facility DNR order (yellow form or pink MOST form) -     Chief Complaint  Patient presents with  . Acute Visit    Review medication    HPI:  Pt is a 85 y.o. female seen today for an acute visit for weight loss about #14 Ibs in the past 6 months, gradual. Hx of Hypothyroidism, on Levothyroxine 62mg qd, last TSH 1.82 08/20/19 creat 1.72 08/20/19, Anemia, taking Fe 229mqd, Hgb 10.6 08/20/19. HTN, blood pressure is controlled, on Metoprolol 12.81m72mid. Her mood is stable, on Invega 3mg20m, Lithium 150mg53m Duloxetine 60mg 17mWellbutrin 450mg q53mERD, stable, on Omeprazole 20mg qd41m Past Medical History:  Diagnosis Date  . Anxiety and depression   . Aortic valve disease   . BBB (bundle branch block)    left, chronic  . Bipolar 1 disorder (HCC)   .[place]  (chronic kidney disease), stage III   . GERD (gastroesophageal reflux disease)   . Herpes zoster   . Hiatal hernia   . Hyperlipidemia   . Hypertension   . Hypertensive CKD (chronic kidney disease)   . Hypothyroid   . Osteoarthritis   . Osteopenia   . Renal artery stenosis (HCC)    left   Past Surgical History:  Procedure Laterality Date  . CARDIAC CATHETERIZATION  03/21/04   essentially normal coronary arteries with moderate decrease in left ventricular function, 80% ostial left renal artery stenosis  . COLONOSCOPY  11/11/2006   Dr. John PerScarlette Shortsticulosis, colon polyps  . HEMORRHOQuayNDOSCOPY  11/11/2006   Dr. John PerScarlette Shortsgeal stricture s/p balloon dilation, GERD, Zenker's Diverticulum    No Known Allergies  Allergies as of 10/09/2019   No Known Allergies     Medication List       Accurate as of October 09, 2019  3:53 PM. If you have any questions, ask your nurse or doctor.        STOP taking these medications   acetaminophen 325 MG tablet Commonly known as: TYLENOL Stopped by: Mattalyn Anderegg X Shantanu Strauch, NP     TAKE these medications   aspirin 81 MG chewable tablet Chew 81 mg by mouth daily.   buPROPion 150 MG 24 hr tablet Commonly known as: WELLBUTRIN XL Take 450 mg by mouth daily. 3 tabs   cholecalciferol 25 MCG (1000 UT) tablet Commonly  known as: VITAMIN D3 Take 1,000 Units by mouth daily.   Deplin 7.5 MG Tabs Take 1 tablet by mouth daily.   DULoxetine 60 MG capsule Commonly known as: CYMBALTA Take 60 mg by mouth daily.   ferrous sulfate 220 (44 Fe) MG/5ML solution Take 220 mg by mouth daily. 3.17m once a morning Mon,Wed, fRI   levothyroxine 50 MCG tablet Commonly known as: SYNTHROID TAKE ONE TABLET BY MOUTH EVERY MORNING ON AN EMPTY STOMACH FOR THYROID   lithium carbonate 150 MG capsule Take 150 mg by mouth daily.   loperamide 2 MG tablet Commonly known as: Imodium A-D 1/2 to 1 tablet daily as needed to control diarrhea if  you have 3 or more loose stool.   METAMUCIL FIBER PO 1 tablespoon by mouth once a morning. mix with water or juice   metoprolol tartrate 12.5 mg Tabs tablet Commonly known as: LOPRESSOR Take 12.5 mg by mouth 2 (two) times daily.   omeprazole 20 MG capsule Commonly known as: PRILOSEC Take 1 capsule (20 mg total) by mouth daily.   paliperidone 3 MG 24 hr tablet Commonly known as: INVEGA Take 3 mg by mouth daily.   simvastatin 5 MG tablet Commonly known as: ZOCOR TAKE ONE TABLET BY MOUTH EVERY NIGHT AT BEDTIME   zinc oxide 20 % ointment 1 application. To buttocks after every incontinent episode and as needed for redness. May keep at bedside. As Needed      ROS was provided with assistance of staff.  Review of Systems  Constitutional: Positive for unexpected weight change. Negative for activity change, appetite change, chills, diaphoresis, fatigue and fever.  HENT: Positive for hearing loss. Negative for congestion and voice change.   Respiratory: Negative for cough, shortness of breath and wheezing.   Cardiovascular: Positive for leg swelling. Negative for chest pain and palpitations.  Gastrointestinal: Negative for abdominal distention, abdominal pain, constipation, diarrhea, nausea and vomiting.  Genitourinary: Positive for dysuria. Negative for urgency.  Musculoskeletal: Positive for arthralgias and gait problem.  Neurological: Negative for dizziness, speech difficulty, weakness and headaches.       Memory lapses.   Psychiatric/Behavioral: Negative for agitation, behavioral problems, hallucinations and sleep disturbance. The patient is not nervous/anxious.     Immunization History  Administered Date(s) Administered  . Influenza,inj,Quad PF,6+ Mos 08/27/2018  . Influenza-Unspecified 08/09/2009, 08/28/2010, 09/11/2011, 09/09/2012, 08/27/2013, 11/26/2013, 09/07/2014, 08/27/2015, 08/08/2016  . Pneumococcal Conjugate-13 06/10/2014  . Pneumococcal Polysaccharide-23 01/16/2010,  08/28/2010, 09/11/2011  . Pneumococcal-Unspecified 08/27/2015  . Td 08/03/2008  . Zoster 01/16/2010, 07/30/2011   Pertinent  Health Maintenance Due  Topic Date Due  . DEXA SCAN  10/04/1990  . INFLUENZA VACCINE  Completed  . PNA vac Low Risk Adult  Completed   Fall Risk  08/13/2018 07/16/2018 01/17/2018 11/07/2016 07/10/2016  Falls in the past year? Yes Yes Yes No Yes  Number falls in past yr: 1 2 or more 2 or more - 1  Injury with Fall? No Yes Yes - Yes  Comment - Patient has a bruise to her right forearm and shoulder.  broken ribs - -  Risk Factor Category  - High Fall Risk - - -  Risk for fall due to : - Impaired balance/gait;History of fall(s) - - -  Follow up - Follow up appointment - - -   Functional Status Survey:    Vitals:   10/09/19 1433  BP: (!) 128/48  Pulse: 68  Temp: 97.8 F (36.6 C)  SpO2: 99%  Weight: 121 lb 3.2 oz (  55 kg)  Height: _0  (1.422 m)   Body mass index is 27.17 kg/m. Physical Exam Vitals signs and nursing note reviewed.  Constitutional:      General: She is not in acute distress.    Appearance: She is diaphoretic. She is not ill-appearing or toxic-appearing.     Comments: Over weight  HENT:     Head: Normocephalic.     Nose: Nose normal.     Mouth/Throat:     Mouth: Mucous membranes are moist.  Eyes:     Extraocular Movements: Extraocular movements intact.     Conjunctiva/sclera: Conjunctivae normal.     Pupils: Pupils are equal, round, and reactive to light.  Neck:     Musculoskeletal: Normal range of motion and neck supple.  Cardiovascular:     Rate and Rhythm: Normal rate and regular rhythm.     Heart sounds: No murmur.  Pulmonary:     Breath sounds: No wheezing, rhonchi or rales.  Abdominal:     General: Bowel sounds are normal. There is no distension.     Palpations: Abdomen is soft.     Tenderness: There is no abdominal tenderness. There is no right CVA tenderness, left CVA tenderness, guarding or rebound.  Musculoskeletal:         General: Deformity present.     Right lower leg: Edema present.     Left lower leg: Edema present.     Comments: Arthritic changes in fingers. Kyphosis. Ambulates with walker. Trace edema BLE  Skin:    General: Skin is warm.     Findings: Bruising present.     Comments: A small bruise back of the right hand.   Neurological:     General: No focal deficit present.     Mental Status: She is alert. Mental status is at baseline.     Cranial Nerves: No cranial nerve deficit.     Motor: No weakness.     Coordination: Coordination normal.     Gait: Gait abnormal.     Comments: Oriented to person, place.   Psychiatric:        Mood and Affect: Mood normal.        Behavior: Behavior normal.     Labs reviewed: Recent Labs    03/12/19 05/25/19 06/22/19  NA 142 138 140  K 4.2 4.4 4.9  CL  --   --  108  CO2  --   --  27  BUN 27* 37* 26*  CREATININE 1.7* 1.8* 1.8*  CALCIUM  --   --  9.3   Recent Labs    03/12/19 05/25/19  AST 16 30  ALT 16 43*   Recent Labs    11/06/18 11/20/18 03/12/19 05/25/19 06/22/19  WBC 10.1 9.2  --   --  9.7  HGB 11.0* 10.7* 11.7* 10.1* 10.4*  HCT 33* 33*  33* 36 30* 32*  PLT 308 322 320 288 272   Lab Results  Component Value Date   TSH 3.16 04/03/2019   Lab Results  Component Value Date   HGBA1C 5.3 01/31/2018   Lab Results  Component Value Date   CHOL 152 07/17/2018   HDL 68 07/17/2018   LDLCALC 66 07/17/2018   TRIG 94 07/17/2018   CHOLHDL 2.2 07/17/2018    Significant Diagnostic Results in last 30 days:  No results found.  Assessment/Plan Weight loss About #14Ibs in the past half year, the patient denied abd pain, nausea, vomiting, diarrhea, or constipation. Will continue SNF  [facility] for safety, care assistance, f/u dietary, update CBC/diff, CMP/eGFR, TSH  Hypothyroidism Last TSH 1.82 08/20/19, continue Levothyroxine 51mg qd  Gastroesophageal reflux disease without esophagitis Stable, continue Omeprazole 229mqd.   Anemia,  chronic disease Chronic, last Hgb 10.6 08/20/19, continue Fe daily supplement  Essential hypertension Blood pressure is controlled, continue Metoprolol 12.65m67mid.   CKD (chronic kidney disease) stage 3, GFR 30-59 ml/min Chronic, baseline creat 1.7-1.8, last creat 1.72 08/20/19  Bipolar disorder ([place] mood is stable, continue Invega 3mg18m, Lithium 150mg22m Duloxetine 60mg 29mWellbutrin 450mg q3m    Family/ staff Communication: plan of care reviewed with the patient and charge nurse.   Labs/tests ordered: none  Time spend 25 minutes.

## 2019-10-09 NOTE — Assessment & Plan Note (Signed)
Blood pressure is controlled, continue Metoprolol 12.5mg bid.  

## 2019-10-09 NOTE — Assessment & Plan Note (Addendum)
Last TSH 1.82 08/20/19, continue Levothyroxine 51mcg qd

## 2019-10-09 NOTE — Assessment & Plan Note (Signed)
About #14Ibs in the past half year, the patient denied abd pain, nausea, vomiting, diarrhea, or constipation. Will continue SNF [facility] for safety, care assistance, f/u dietary, update CBC/diff, CMP/eGFR, TSH

## 2019-10-14 ENCOUNTER — Encounter: Payer: Self-pay | Admitting: Internal Medicine

## 2019-10-14 ENCOUNTER — Non-Acute Institutional Stay (SKILLED_NURSING_FACILITY): Payer: Medicare Other | Admitting: Internal Medicine

## 2019-10-14 DIAGNOSIS — E034 Atrophy of thyroid (acquired): Secondary | ICD-10-CM

## 2019-10-14 DIAGNOSIS — I10 Essential (primary) hypertension: Secondary | ICD-10-CM | POA: Diagnosis not present

## 2019-10-14 DIAGNOSIS — D638 Anemia in other chronic diseases classified elsewhere: Secondary | ICD-10-CM

## 2019-10-14 DIAGNOSIS — R1310 Dysphagia, unspecified: Secondary | ICD-10-CM

## 2019-10-14 DIAGNOSIS — N184 Chronic kidney disease, stage 4 (severe): Secondary | ICD-10-CM

## 2019-10-14 NOTE — Progress Notes (Signed)
Provider:  Veleta Miners MD Location:    [facility]ursing Home Room Number: 15 Place of Service:  SNF (31)  PCP: Mast, Man X, NP Patient Care Team: Mast, Man X, NP as PCP - General (Nurse Practitioner) Lorretta Harp, MD as Consulting Physician (Cardiology) Aquilla Hacker, MD as Referring Physician (Psychiatry) Irene Shipper, MD as Consulting Physician (Gastroenterology) Newton Pigg, MD as Consulting Physician (Obstetrics and Gynecology)  Extended Emergency Contact Information Primary Emergency Contact: Hobbs,Diane Address: [address]          [place], [place] [address] [name] of [place] Phone: [phone] Mobile Phone: [phone] Relation: Daughter  Code Status: DNR Goals of Care: Advanced Directive information Advanced Directives 08/17/2019  Does Patient Have a Medical Advance Directive? Yes  Type of Advance Directive Out of facility DNR (pink MOST or yellow form)  Does patient want to make changes to medical advance directive? No - Patient declined  Copy of [place] in Chart? -  Would patient like information on creating a medical advance directive? -  Pre-existing out of facility DNR order (yellow form or pink MOST form) -      Chief Complaint  Patient presents with   New Admit To SNF    Admission to SNF    HPI: Patient is a 85 y.o. female seen today for admission to SNF from [place]  Patient has a history of hypertension, hypothyroidism, Hyperlipidemia, CKD stage 3, GERD, Anemia mild cognitive impairment, falls And Bipolar Illness follows with Outside psychiatrist  Due to Recurrent Falls and unable to take care of her ADLS she is now in SNF Patient has adjusted to her new Room. Appetite is fair Has lost Almost 10 lbs in past few months No Recent Falls. No New Nursing issues   Past Medical History:  Diagnosis Date   Anxiety and depression    Aortic valve disease    BBB (bundle branch block)    left, chronic   Bipolar  1 disorder (HCC)    CKD (chronic kidney disease), stage III    GERD (gastroesophageal reflux disease)    Herpes zoster    Hiatal hernia    Hyperlipidemia    Hypertension    Hypertensive CKD (chronic kidney disease)    Hypothyroid    Osteoarthritis    Osteopenia    Renal artery stenosis (HCC)    left   Past Surgical History:  Procedure Laterality Date   CARDIAC CATHETERIZATION  03/21/04   essentially normal coronary arteries with moderate decrease in left ventricular function, 80% ostial left renal artery stenosis   COLONOSCOPY  11/11/2006   Dr. Scarlette Shorts , diverticulosis, colon polyps   [place]   PANENDOSCOPY  11/11/2006   Dr. Scarlette Shorts, esophageal stricture s/p balloon dilation, GERD, Zenker's Diverticulum    reports that she has never smoked. She has never used smokeless tobacco. She reports that she does not drink alcohol or use drugs. Social History   Socioeconomic History   Marital status: Widowed    Spouse name: Not on file   Number of children: 1   Years of education: Not on file   Highest education level: Not on file  Occupational History   Occupation: retired Engineer, manufacturing strain: Not hard at all   Food insecurity    Worry: Never true    Inability: Never true   Transportation needs    Medical: No    Non-medical: No  Tobacco Use   Smoking status: Never Smoker   Smokeless tobacco: Never Used  Substance and Sexual Activity   Alcohol use: No   Drug use: No   Sexual activity: Never  Lifestyle   Physical activity    Days per week: 2 days    Minutes per session: 30 min   Stress: Only a little  Relationships   Social connections    Talks on phone: More than three times a week    Gets together: More than three times a week    Attends religious service: More than 4 times per year    Active member of club or organization: No    Attends meetings of clubs or organizations: Never      Relationship status: Widowed   Intimate partner violence    Fear of current or ex partner: No    Emotionally abused: No    Physically abused: No    Forced sexual activity: No  Other Topics Concern   Not on file  Social History Narrative   [facility]   Widowed   Never smoked   Alcohol none   Exercise 3 times a week   POA      Diet?       Do you drink/eat things with caffeine?  Yes      Marital status? married                                   What year were you married? 1950      Do you live in a house, apartment, assisted living, condo, trailer, etc.? [place]      Is it one or more stories? Yes (elevators)      How many persons live in your home? 2      Do you have any pets in your home? (please list) none      Current or past profession: Network engineer      Do you exercise?     yes                                 Type & how often? 3x week   Walks with walker      Do you have a living will? yes      Do you have a DNR form?   yes                               If not, do you want to discuss one?      Do you have signed POA/HPOA for forms?     Functional Status Survey:    Family History  Problem Relation Age of Onset   Stroke Father    Stroke Sister 61   Diabetes Brother    Appendicitis Mother    Diabetes Brother    Heart disease Brother    Colon cancer Neg Hx    Liver cancer Neg Hx     Health Maintenance  Topic Date Due   DEXA SCAN  10/04/1990   TETANUS/TDAP  09/25/2029 (Originally 08/03/2018)   INFLUENZA VACCINE  Completed   PNA vac Low Risk Adult  Completed    No Known Allergies  Allergies as of 10/14/2019   No Known Allergies     Medication List  Accurate as of October 14, 2019  9:26 AM. If you have any questions, ask your nurse or doctor.        aspirin 81 MG chewable tablet Chew 81 mg by mouth daily.   buPROPion 150 MG 24 hr tablet Commonly known as: WELLBUTRIN XL Take 450 mg by mouth  daily. 3 tabs   cholecalciferol 25 MCG (1000 UT) tablet Commonly known as: VITAMIN D3 Take 1,000 Units by mouth daily.   Deplin 7.5 MG Tabs Take 1 tablet by mouth daily.   DULoxetine 60 MG capsule Commonly known as: CYMBALTA Take 60 mg by mouth daily.   ferrous sulfate 220 (44 Fe) MG/5ML solution Take 220 mg by mouth daily. 3.37ml once a morning Mon,Wed, fRI   levothyroxine 50 MCG tablet Commonly known as: SYNTHROID TAKE ONE TABLET BY MOUTH EVERY MORNING ON AN EMPTY STOMACH FOR THYROID   lithium carbonate 150 MG capsule Take 150 mg by mouth daily.   loperamide 2 MG tablet Commonly known as: Imodium A-D 1/2 to 1 tablet daily as needed to control diarrhea if you have 3 or more loose stool.   METAMUCIL FIBER PO 1 tablespoon by mouth once a morning. mix with water or juice   metoprolol tartrate 12.5 mg Tabs tablet Commonly known as: LOPRESSOR Take 12.5 mg by mouth 2 (two) times daily.   omeprazole 20 MG capsule Commonly known as: PRILOSEC Take 1 capsule (20 mg total) by mouth daily.   paliperidone 3 MG 24 hr tablet Commonly known as: INVEGA Take 3 mg by mouth daily.   simvastatin 5 MG tablet Commonly known as: ZOCOR TAKE ONE TABLET BY MOUTH EVERY NIGHT AT BEDTIME   zinc oxide 20 % ointment 1 application. To buttocks after every incontinent episode and as needed for redness. May keep at bedside. As Needed       Review of Systems  Unable to perform ROS: Dementia    Vitals:   10/14/19 0921  BP: (!) 150/50  Pulse: 75  Resp: 18  Temp: 98.6 F (37 C)  SpO2: 96%  Weight: 121 lb 3.2 oz (55 kg)  Height: 4\' 8"  (1.422 m)   Body mass index is 27.17 kg/m. Physical Exam  Constitutional: . Well-developed and well-nourished.  HENT:  Head: Normocephalic.  Mouth/Throat: Oropharynx is clear and moist.  Eyes: Pupils are equal, round, and reactive to light.  Neck: Neck supple.  Cardiovascular: Normal rate and normal heart sounds.  No murmur heard. Pulmonary/Chest:  Effort normal and breath sounds normal. No respiratory distress. No wheezes. She has no rales.  Abdominal: Soft. Bowel sounds are normal. No distension. There is no tenderness. There is no rebound.  Musculoskeletal: No edema.  Lymphadenopathy: none Neurological: No Focal deficits Respond Appropriately Skin: Skin is warm and dry.  Psychiatric: Normal mood and affect. Behavior is normal. Thought content normal.    Labs reviewed: Basic Metabolic Panel: Recent Labs    03/12/19 05/25/19 06/22/19  NA 142 138 140  K 4.2 4.4 4.9  CL  --   --  108  CO2  --   --  27  BUN 27* 37* 26*  CREATININE 1.7* 1.8* 1.8*  CALCIUM  --   --  9.3   Liver Function Tests: Recent Labs    03/12/19 05/25/19  AST 16 30  ALT 16 43*   No results for input(s): LIPASE, AMYLASE in the last 8760 hours. No results for input(s): AMMONIA in the last 8760 hours. CBC: Recent Labs    11/06/18  11/20/18 03/12/19 05/25/19 06/22/19  WBC 10.1 9.2  --   --  9.7  HGB 11.0* 10.7* 11.7* 10.1* 10.4*  HCT 33* 33*   33* 36 30* 32*  PLT 308 322 320 288 272   Cardiac Enzymes: No results for input(s): CKTOTAL, CKMB, CKMBINDEX, TROPONINI in the last 8760 hours. BNP: Invalid input(s): POCBNP Lab Results  Component Value Date   HGBA1C 5.3 01/31/2018   Lab Results  Component Value Date   TSH 3.16 04/03/2019   Lab Results  Component Value Date   VITAMINB12 283 12/26/2016   Lab Results  Component Value Date   FOLATE >24.0 12/26/2016   Lab Results  Component Value Date   IRON 64 12/26/2016   TIBC 304 12/26/2016   FERRITIN 49 11/04/2017    Imaging and Procedures obtained prior to SNF admission: Ct Head Wo Contrast  Result Date: 07/11/2018 CLINICAL DATA:  Unwitnessed fall at 2 p.m. this afternoon. State on the ground until 8 p.m. No loss of consciousness. Swelling and redness to the right eye. EXAM: CT HEAD WITHOUT CONTRAST CT MAXILLOFACIAL WITHOUT CONTRAST CT CERVICAL SPINE WITHOUT CONTRAST TECHNIQUE:  Multidetector CT imaging of the head, cervical spine, and maxillofacial structures were performed using the standard protocol without intravenous contrast. Multiplanar CT image reconstructions of the cervical spine and maxillofacial structures were also generated. COMPARISON:  CT head 06/14/2018. CT head max cervical 06/29/2016 FINDINGS: CT HEAD FINDINGS Brain: Diffuse cerebral atrophy, mild for age. Mild ventricular dilatation consistent with central atrophy. Patchy low-attenuation changes in the deep white matter consistent with small vessel ischemia. No mass-effect or midline shift. No abnormal extra-axial fluid collections. Gray-white matter junctions are distinct. Basal cisterns are not effaced. No acute intracranial hemorrhage. Vascular: Mild intracranial arterial vascular calcifications. Skull: Calvarium appears intact. No acute depressed fractures. Other: Small subcutaneous scalp hematoma over the left anterior frontal region. CT MAXILLOFACIAL FINDINGS Osseous: No fracture or mandibular dislocation. No destructive process. Orbits: Small right periorbital hematoma. No retrobulbar involvement. Globes and extraocular muscles appear intact and symmetrical. Sinuses: Mucosal thickening in the paranasal sinuses. No acute air-fluid levels. Mastoid air cells are clear. Soft tissues: No significant soft tissue swelling or hematoma. CT CERVICAL SPINE FINDINGS Alignment: Normal alignment of the cervical spine and facet joints. C1-2 articulation appears intact. Skull base and vertebrae: Skull base appears intact. No vertebral compression deformities. No focal bone lesion or bone destruction. Bone cortex appears intact. Soft tissues and spinal canal: No prevertebral soft tissue swelling. No abnormal paraspinal soft tissue mass or infiltration. Disc levels: Degenerative changes throughout the cervical spine with narrowed disc spaces and endplate hypertrophic changes most prominent at C5-6, C6-7, and C7-T1 levels.  Degenerative changes throughout the facet joints. Upper chest: Calcification and scarring in the lung apices, likely postinflammatory. Vascular calcifications. Other: None. IMPRESSION: 1. No acute intracranial abnormalities. Chronic atrophy and small vessel ischemic changes. 2. Small right periorbital hematoma. No retrobulbar involvement. No acute displaced orbital or facial fractures. 3. Normal alignment of the cervical spine. Degenerative changes. No acute displaced fractures identified. Electronically Signed   By: Lucienne Capers M.D.   On: 07/11/2018 21:52   Ct Cervical Spine Wo Contrast  Result Date: 07/11/2018 CLINICAL DATA:  Unwitnessed fall at 2 p.m. this afternoon. State on the ground until 8 p.m. No loss of consciousness. Swelling and redness to the right eye. EXAM: CT HEAD WITHOUT CONTRAST CT MAXILLOFACIAL WITHOUT CONTRAST CT CERVICAL SPINE WITHOUT CONTRAST TECHNIQUE: Multidetector CT imaging of the head, cervical spine, and maxillofacial structures were  performed using the standard protocol without intravenous contrast. Multiplanar CT image reconstructions of the cervical spine and maxillofacial structures were also generated. COMPARISON:  CT head 06/14/2018. CT head max cervical 06/29/2016 FINDINGS: CT HEAD FINDINGS Brain: Diffuse cerebral atrophy, mild for age. Mild ventricular dilatation consistent with central atrophy. Patchy low-attenuation changes in the deep white matter consistent with small vessel ischemia. No mass-effect or midline shift. No abnormal extra-axial fluid collections. Gray-white matter junctions are distinct. Basal cisterns are not effaced. No acute intracranial hemorrhage. Vascular: Mild intracranial arterial vascular calcifications. Skull: Calvarium appears intact. No acute depressed fractures. Other: Small subcutaneous scalp hematoma over the left anterior frontal region. CT MAXILLOFACIAL FINDINGS Osseous: No fracture or mandibular dislocation. No destructive process.  Orbits: Small right periorbital hematoma. No retrobulbar involvement. Globes and extraocular muscles appear intact and symmetrical. Sinuses: Mucosal thickening in the paranasal sinuses. No acute air-fluid levels. Mastoid air cells are clear. Soft tissues: No significant soft tissue swelling or hematoma. CT CERVICAL SPINE FINDINGS Alignment: Normal alignment of the cervical spine and facet joints. C1-2 articulation appears intact. Skull base and vertebrae: Skull base appears intact. No vertebral compression deformities. No focal bone lesion or bone destruction. Bone cortex appears intact. Soft tissues and spinal canal: No prevertebral soft tissue swelling. No abnormal paraspinal soft tissue mass or infiltration. Disc levels: Degenerative changes throughout the cervical spine with narrowed disc spaces and endplate hypertrophic changes most prominent at C5-6, C6-7, and C7-T1 levels. Degenerative changes throughout the facet joints. Upper chest: Calcification and scarring in the lung apices, likely postinflammatory. Vascular calcifications. Other: None. IMPRESSION: 1. No acute intracranial abnormalities. Chronic atrophy and small vessel ischemic changes. 2. Small right periorbital hematoma. No retrobulbar involvement. No acute displaced orbital or facial fractures. 3. Normal alignment of the cervical spine. Degenerative changes. No acute displaced fractures identified. Electronically Signed   By: Lucienne Capers M.D.   On: 07/11/2018 21:52   Ct Maxillofacial Wo Contrast  Result Date: 07/11/2018 CLINICAL DATA:  Unwitnessed fall at 2 p.m. this afternoon. State on the ground until 8 p.m. No loss of consciousness. Swelling and redness to the right eye. EXAM: CT HEAD WITHOUT CONTRAST CT MAXILLOFACIAL WITHOUT CONTRAST CT CERVICAL SPINE WITHOUT CONTRAST TECHNIQUE: Multidetector CT imaging of the head, cervical spine, and maxillofacial structures were performed using the standard protocol without intravenous contrast.  Multiplanar CT image reconstructions of the cervical spine and maxillofacial structures were also generated. COMPARISON:  CT head 06/14/2018. CT head max cervical 06/29/2016 FINDINGS: CT HEAD FINDINGS Brain: Diffuse cerebral atrophy, mild for age. Mild ventricular dilatation consistent with central atrophy. Patchy low-attenuation changes in the deep white matter consistent with small vessel ischemia. No mass-effect or midline shift. No abnormal extra-axial fluid collections. Gray-white matter junctions are distinct. Basal cisterns are not effaced. No acute intracranial hemorrhage. Vascular: Mild intracranial arterial vascular calcifications. Skull: Calvarium appears intact. No acute depressed fractures. Other: Small subcutaneous scalp hematoma over the left anterior frontal region. CT MAXILLOFACIAL FINDINGS Osseous: No fracture or mandibular dislocation. No destructive process. Orbits: Small right periorbital hematoma. No retrobulbar involvement. Globes and extraocular muscles appear intact and symmetrical. Sinuses: Mucosal thickening in the paranasal sinuses. No acute air-fluid levels. Mastoid air cells are clear. Soft tissues: No significant soft tissue swelling or hematoma. CT CERVICAL SPINE FINDINGS Alignment: Normal alignment of the cervical spine and facet joints. C1-2 articulation appears intact. Skull base and vertebrae: Skull base appears intact. No vertebral compression deformities. No focal bone lesion or bone destruction. Bone cortex appears intact. Soft tissues  and spinal canal: No prevertebral soft tissue swelling. No abnormal paraspinal soft tissue mass or infiltration. Disc levels: Degenerative changes throughout the cervical spine with narrowed disc spaces and endplate hypertrophic changes most prominent at C5-6, C6-7, and C7-T1 levels. Degenerative changes throughout the facet joints. Upper chest: Calcification and scarring in the lung apices, likely postinflammatory. Vascular calcifications.  Other: None. IMPRESSION: 1. No acute intracranial abnormalities. Chronic atrophy and small vessel ischemic changes. 2. Small right periorbital hematoma. No retrobulbar involvement. No acute displaced orbital or facial fractures. 3. Normal alignment of the cervical spine. Degenerative changes. No acute displaced fractures identified. Electronically Signed   By: Lucienne Capers M.D.   On: 07/11/2018 21:52    Assessment/Plan Essential Hypertension  On Low dose of Lopressor Mildily Elevated BP   Unsteady gait Now in SNF for Higher level of care Therapy to evaluate and treat Low Back Pain Xray showed Narrowing of Lumbar area No fractures Hypothyroidism due to acquired atrophy of thyroid TSH Normal in 9/20 Bipolar affective disorder,  Patient does not have any Behavior Issues On Multiple Meds per Psychiatrist Anemia of chronic disease Hgb stable on Iron  Foot callus Follows with Podiatry  CKD (chronic kidney disease) stage 4, GFR 15-29 ml/min (HCC) Creat Stable  Dysphagia, unspecified type Meds given Crushed  Hyperlipidemia On Low dose of statin If continue to loose weight will consider discontinuing  Cognitive Impairment Supportive Care  Family/ staff Communication:   Labs/tests ordered: Total time spent in this patient care encounter was  _35  minutes; greater than 50% of the visit spent counseling patient and staff, reviewing records , Labs and coordinating care for problems addressed at this encounter.

## 2019-11-03 ENCOUNTER — Encounter: Payer: Self-pay | Admitting: Nurse Practitioner

## 2019-11-03 ENCOUNTER — Non-Acute Institutional Stay (SKILLED_NURSING_FACILITY): Payer: Medicare Other | Admitting: Nurse Practitioner

## 2019-11-03 DIAGNOSIS — E034 Atrophy of thyroid (acquired): Secondary | ICD-10-CM | POA: Diagnosis not present

## 2019-11-03 DIAGNOSIS — D638 Anemia in other chronic diseases classified elsewhere: Secondary | ICD-10-CM | POA: Diagnosis not present

## 2019-11-03 DIAGNOSIS — R634 Abnormal weight loss: Secondary | ICD-10-CM | POA: Diagnosis not present

## 2019-11-03 DIAGNOSIS — I10 Essential (primary) hypertension: Secondary | ICD-10-CM

## 2019-11-03 DIAGNOSIS — F319 Bipolar disorder, unspecified: Secondary | ICD-10-CM | POA: Diagnosis not present

## 2019-11-03 DIAGNOSIS — K219 Gastro-esophageal reflux disease without esophagitis: Secondary | ICD-10-CM

## 2019-11-03 NOTE — Assessment & Plan Note (Signed)
Continue Fe 3x/wk, Hgb 10.6 08/20/19

## 2019-11-03 NOTE — Assessment & Plan Note (Signed)
Her mood is stable, continue Paliperidone 3mg  qd, Deplin 7.5mg  qd, Lithium 150mg  qd, Duloxetine 60mg  qd, Wellbutrin 150mg  qd

## 2019-11-03 NOTE — Assessment & Plan Note (Signed)
#   19Ibs wt loss in a month? Wkly wt, f/u dietary, last TSH wnl 07/2019

## 2019-11-03 NOTE — Assessment & Plan Note (Signed)
Blood pressure is controlled, continue Metoprolol. 

## 2019-11-03 NOTE — Assessment & Plan Note (Signed)
Stable, continue Omeprazole.  

## 2019-11-03 NOTE — Assessment & Plan Note (Signed)
TSH 1.82 08/20/19, continue Levothyroxine 69mcg qd.

## 2019-11-03 NOTE — Progress Notes (Signed)
Location:   [place] Room Number: 15 Place of Service:  SNF (31) Provider:  Pressley Tadesse X, NP  Courtnie Brenes X, NP  Patient Care Team: Athel Merriweather X, NP as PCP - General (Nurse Practitioner) Lorretta Harp, MD as Consulting Physician (Cardiology) Aquilla Hacker, MD as Referring Physician (Psychiatry) Irene Shipper, MD as Consulting Physician (Gastroenterology) Newton Pigg, MD as Consulting Physician (Obstetrics and Gynecology)  Extended Emergency Contact Information Primary Emergency Contact: Hobbs,Diane Address: [address]          [place], [place] [address] [name] of [place] Phone: [phone] Mobile Phone: [phone] Relation: Daughter  Code Status:  DNR Goals of care: Advanced Directive information Advanced Directives 08/17/2019  Does Patient Have a Medical Advance Directive? Yes  Type of Advance Directive Out of facility DNR (pink MOST or yellow form)  Does patient want to make changes to medical advance directive? No - Patient declined  Copy of [place] in Chart? -  Would patient like information on creating a medical advance directive? -  Pre-existing out of facility DNR order (yellow form or pink MOST form) -     Chief Complaint  Patient presents with  . Medical Management of Chronic Issues  . Health Maintenance    Dexa scan    HPI:  Pt is a 85 y.o. female seen today for medical management of chronic diseases.    The patient resides in SNF [facility] for safety, care assistance, weight loss #19Ibs ? In the past month. Her mood is stable, on Paliperidone 3mg  qd, Deplin 7.5mg  qd, Lithium 150mg  qd, Duloxetine 60mg  qd, Wellbutrin 150mg  qd. GERD, stable, on Omeprazole 20mg  qd. HTN, blood pressure is controlled, on Metoprolol 12.5mg  bid. Hypothyroidism, on Levothyroxine 90mcg qd. Anemia, stable, on Fe 220mg  qd 3x/wk.    Past Medical History:  Diagnosis Date  . Anxiety and depression   . Aortic valve disease    . BBB (bundle branch block)    left, chronic  . Bipolar 1 disorder ([place])   . CKD (chronic kidney disease), stage III   . GERD (gastroesophageal reflux disease)   . Herpes zoster   . Hiatal hernia   . Hyperlipidemia   . Hypertension   . Hypertensive CKD (chronic kidney disease)   . Hypothyroid   . Osteoarthritis   . Osteopenia   . Renal artery stenosis (HCC)    left   Past Surgical History:  Procedure Laterality Date  . CARDIAC CATHETERIZATION  03/21/04   essentially normal coronary arteries with moderate decrease in left ventricular function, 80% ostial left renal artery stenosis  . COLONOSCOPY  11/11/2006   Dr. Scarlette Shorts , diverticulosis, colon polyps  . [place]  . PANENDOSCOPY  11/11/2006   Dr. Scarlette Shorts, esophageal stricture s/p balloon dilation, GERD, Zenker's Diverticulum    No Known Allergies  Allergies as of 11/03/2019   No Known Allergies     Medication List       Accurate as of November 03, 2019  4:40 PM. If you have any questions, ask your nurse or doctor.        aspirin 81 MG chewable tablet Chew 81 mg by mouth daily.   buPROPion 150 MG 24 hr tablet Commonly known as: WELLBUTRIN XL Take 150 mg by mouth. 3 tabs each morning   cholecalciferol 25 MCG (1000 UT) tablet Commonly known as: VITAMIN D3 Take 1,000 Units by mouth daily.   Deplin 7.5 MG  Tabs Take 1 tablet by mouth daily.   DULoxetine 60 MG capsule Commonly known as: CYMBALTA Take 60 mg by mouth daily.   feeding supplement Liqd Commonly known as: BOOST / RESOURCE BREEZE Take 1 Container by mouth every morning.   ferrous sulfate 220 (44 Fe) MG/5ML solution Take 220 mg by mouth daily. 3.92ml once a morning Mon,Wed, fRI   levothyroxine 50 MCG tablet Commonly known as: SYNTHROID TAKE ONE TABLET BY MOUTH EVERY MORNING ON AN EMPTY STOMACH FOR THYROID   lithium carbonate 150 MG capsule Take 150 mg by mouth daily.   loperamide 2 MG tablet Commonly known as: Imodium A-D  1/2 to 1 tablet daily as needed to control diarrhea if you have 3 or more loose stool.   METAMUCIL FIBER PO 1 tablespoon by mouth once a morning. mix with water or juice   metoprolol tartrate 12.5 mg Tabs tablet Commonly known as: LOPRESSOR Take 12.5 mg by mouth 2 (two) times daily.   omeprazole 20 MG capsule Commonly known as: PRILOSEC Take 1 capsule (20 mg total) by mouth daily.   paliperidone 3 MG 24 hr tablet Commonly known as: INVEGA Take 3 mg by mouth daily.   simvastatin 5 MG tablet Commonly known as: ZOCOR TAKE ONE TABLET BY MOUTH EVERY NIGHT AT BEDTIME   zinc oxide 20 % ointment 1 application. To buttocks after every incontinent episode and as needed for redness. May keep at bedside. As Needed      ROS was provided with assistance of staff.  Review of Systems  Constitutional: Positive for unexpected weight change. Negative for activity change, appetite change, chills, diaphoresis, fatigue and fever.  HENT: Positive for hearing loss. Negative for congestion and voice change.   Eyes: Negative for visual disturbance.  Respiratory: Negative for cough, shortness of breath and wheezing.   Cardiovascular: Positive for leg swelling. Negative for chest pain and palpitations.  Gastrointestinal: Negative for abdominal distention, abdominal pain, constipation, diarrhea, nausea and vomiting.  Genitourinary: Negative for difficulty urinating, dysuria and urgency.  Musculoskeletal: Positive for arthralgias and gait problem.  Skin: Negative for color change and pallor.  Neurological: Negative for dizziness, speech difficulty, weakness and headaches.       Memory lapses.   Psychiatric/Behavioral: Negative for agitation, behavioral problems, hallucinations and sleep disturbance. The patient is not nervous/anxious.     Immunization History  Administered Date(s) Administered  . Influenza,inj,Quad PF,6+ Mos 08/27/2018  . Influenza-Unspecified 08/09/2009, 08/28/2010, 09/11/2011,  09/09/2012, 08/27/2013, 11/26/2013, 09/07/2014, 08/27/2015, 08/08/2016  . Pneumococcal Conjugate-13 06/10/2014  . Pneumococcal Polysaccharide-23 01/16/2010, 08/28/2010, 09/11/2011  . Pneumococcal-Unspecified 08/27/2015  . Td 08/03/2008  . Zoster 01/16/2010, 07/30/2011   Pertinent  Health Maintenance Due  Topic Date Due  . DEXA SCAN  10/04/1990  . INFLUENZA VACCINE  Completed  . PNA vac Low Risk Adult  Completed   Fall Risk  08/13/2018 07/16/2018 01/17/2018 11/07/2016 07/10/2016  Falls in the past year? Yes Yes Yes No Yes  Number falls in past yr: 1 2 or more 2 or more - 1  Injury with Fall? No Yes Yes - Yes  Comment - Patient has a bruise to her right forearm and shoulder.  broken ribs - -  Risk Factor Category  - High Fall Risk - - -  Risk for fall due to : - Impaired balance/gait;History of fall(s) - - -  Follow up - Follow up appointment - - -   Functional Status Survey:    Vitals:   11/03/19 1019  BP: Marland Kitchen)  141/56  Pulse: 88  Resp: 20  Temp: 97.8 F (36.6 C)  SpO2: 92%  Weight: 102 lb 3.2 oz (46.4 kg)  Height: 4\' 8"  (1.422 m)   Body mass index is 22.91 kg/m. Physical Exam Vitals signs and nursing note reviewed.  Constitutional:      General: She is not in acute distress.    Appearance: Normal appearance. She is normal weight. She is not ill-appearing, toxic-appearing or diaphoretic.  HENT:     Head: Normocephalic and atraumatic.     Nose: Nose normal.     Mouth/Throat:     Mouth: Mucous membranes are moist.  Eyes:     Extraocular Movements: Extraocular movements intact.     Conjunctiva/sclera: Conjunctivae normal.     Pupils: Pupils are equal, round, and reactive to light.  Neck:     Musculoskeletal: Normal range of motion and neck supple.  Cardiovascular:     Rate and Rhythm: Normal rate and regular rhythm.     Heart sounds: No murmur.  Pulmonary:     Breath sounds: No wheezing, rhonchi or rales.  Abdominal:     General: Bowel sounds are normal. There is no  distension.     Palpations: Abdomen is soft.     Tenderness: There is no abdominal tenderness. There is no right CVA tenderness, left CVA tenderness, guarding or rebound.  Musculoskeletal:     Right lower leg: Edema present.     Left lower leg: Edema present.     Comments: Trace edema BLE  Skin:    General: Skin is warm and dry.  Neurological:     General: No focal deficit present.     Mental Status: She is alert. Mental status is at baseline.     Motor: No weakness.     Coordination: Coordination normal.     Gait: Gait abnormal.     Comments: Oriented to person, place.   Psychiatric:        Mood and Affect: Mood normal.        Behavior: Behavior normal.     Labs reviewed: Recent Labs    03/12/19 05/25/19 06/22/19  NA 142 138 140  K 4.2 4.4 4.9  CL  --   --  108  CO2  --   --  27  BUN 27* 37* 26*  CREATININE 1.7* 1.8* 1.8*  CALCIUM  --   --  9.3   Recent Labs    03/12/19 05/25/19  AST 16 30  ALT 16 43*   Recent Labs    11/06/18 11/20/18 03/12/19 05/25/19 06/22/19  WBC 10.1 9.2  --   --  9.7  HGB 11.0* 10.7* 11.7* 10.1* 10.4*  HCT 33* 33*  33* 36 30* 32*  PLT 308 322 320 288 272   Lab Results  Component Value Date   TSH 3.16 04/03/2019   Lab Results  Component Value Date   HGBA1C 5.3 01/31/2018   Lab Results  Component Value Date   CHOL 152 07/17/2018   HDL 68 07/17/2018   LDLCALC 66 07/17/2018   TRIG 94 07/17/2018   CHOLHDL 2.2 07/17/2018    Significant Diagnostic Results in last 30 days:  No results found.  Assessment/Plan Weight loss # 19Ibs wt loss in a month? Wkly wt, f/u dietary, last TSH wnl 07/2019  Hypothyroidism TSH 1.82 08/20/19, continue Levothyroxine 67mcg qd.   Anemia, chronic disease Continue Fe 3x/wk, Hgb 10.6 08/20/19  Bipolar disorder ([place]) Her mood is stable, continue Paliperidone 3mg   qd, Deplin 7.5mg  qd, Lithium 150mg  qd, Duloxetine 60mg  qd, Wellbutrin 150mg  qd  Gastroesophageal reflux disease without esophagitis Stable,  continue Omeprazole.   Essential hypertension Blood pressure is controlled, continue Metoprolol      Family/ staff Communication: plan of care reviewed with the patient and charge nurse.   Labs/tests ordered:  none  Time spend 25 minutes.

## 2019-11-06 ENCOUNTER — Encounter: Payer: Self-pay | Admitting: Internal Medicine

## 2019-11-06 ENCOUNTER — Non-Acute Institutional Stay (SKILLED_NURSING_FACILITY): Payer: Medicare Other | Admitting: Internal Medicine

## 2019-11-06 DIAGNOSIS — E034 Atrophy of thyroid (acquired): Secondary | ICD-10-CM

## 2019-11-06 DIAGNOSIS — R634 Abnormal weight loss: Secondary | ICD-10-CM | POA: Diagnosis not present

## 2019-11-06 DIAGNOSIS — F319 Bipolar disorder, unspecified: Secondary | ICD-10-CM | POA: Diagnosis not present

## 2019-11-06 DIAGNOSIS — R5383 Other fatigue: Secondary | ICD-10-CM

## 2019-11-06 DIAGNOSIS — I10 Essential (primary) hypertension: Secondary | ICD-10-CM

## 2019-11-06 DIAGNOSIS — N184 Chronic kidney disease, stage 4 (severe): Secondary | ICD-10-CM

## 2019-11-06 NOTE — Progress Notes (Signed)
Location:   [place] Room Number: 15 Place of Service:  SNF [id number]) Provider:  Veleta Miners MD  Mast, Man X, NP  Patient Care Team: Mast, Man X, NP as PCP - General (Nurse Practitioner) Lorretta Harp, MD as Consulting Physician (Cardiology) Aquilla Hacker, MD as Referring Physician (Psychiatry) Irene Shipper, MD as Consulting Physician (Gastroenterology) Newton Pigg, MD as Consulting Physician (Obstetrics and Gynecology)  Extended Emergency Contact Information Primary Emergency Contact: Hobbs,Diane Address: [address]          [place], [place] [address] [name] of [place] Phone: [phone] Mobile Phone: [phone] Relation: Daughter  Code Status:  DNR Goals of care: Advanced Directive information Advanced Directives 08/17/2019  Does Patient Have a Medical Advance Directive? Yes  Type of Advance Directive Out of facility DNR (pink MOST or yellow form)  Does patient want to make changes to medical advance directive? No - Patient declined  Copy of [place] in Chart? -  Would patient like information on creating a medical advance directive? -  Pre-existing out of facility DNR order (yellow form or pink MOST form) -     Chief Complaint  Patient presents with  . Acute Visit    Excessive Sleepiness    HPI:  Pt is a 85 y.o. [place] Hayes seen today for an acute visit for Excessive Sleepiness and Weight loss  Patient has a history of hypertension, hypothyroidism, Hyperlipidemia, CKDstage 3,GERD, Anemiamild cognitive impairment,falls And Bipolar Illness follows with Outside psychiatrist  [place] to [place] Per Nurses she continues to be very Sleepy. Goes to sleep while eating. And sleep most of the time. They think her appetite is less due to Sleeping She has lost 10 more lbs since she has been here Patient unable to give me any history. Was Sleeping sitting in chair with Breakfast Tray  in front Denied any complains. Woke up and Answer Appropritely   Past Medical History:  Diagnosis Date  . Anxiety and depression   . Aortic valve disease   . BBB (bundle branch block)    left, chronic  . Bipolar 1 disorder ([place])   . CKD (chronic kidney disease), stage III   . GERD (gastroesophageal reflux disease)   . Herpes zoster   . Hiatal hernia   . Hyperlipidemia   . Hypertension   . Hypertensive CKD (chronic kidney disease)   . Hypothyroid   . Osteoarthritis   . Osteopenia   . Renal artery stenosis (HCC)    left   Past Surgical History:  Procedure Laterality Date  . CARDIAC CATHETERIZATION  03/21/04   essentially normal coronary arteries with moderate decrease in left ventricular function, 80% ostial left renal artery stenosis  . COLONOSCOPY  11/11/2006   Dr. Scarlette Shorts , diverticulosis, colon polyps  . [place]  . PANENDOSCOPY  11/11/2006   Dr. Scarlette Shorts, esophageal stricture s/p balloon dilation, GERD, Zenker's Diverticulum    No Known Allergies  Allergies as of 11/06/2019   No Known Allergies     Medication List       Accurate as of November 06, 2019  1:07 PM. If you have any questions, ask your nurse or doctor.        aspirin 81 MG chewable tablet Chew 81 mg by mouth daily.   buPROPion 150 MG 24 hr tablet Commonly known as: WELLBUTRIN XL Take 150 mg by mouth. 3 tabs each morning   cholecalciferol  25 MCG (1000 UT) tablet Commonly known as: VITAMIN D3 Take 1,000 Units by mouth daily.   Deplin 7.5 MG Tabs Take 1 tablet by mouth daily.   DULoxetine 60 MG capsule Commonly known as: CYMBALTA Take 60 mg by mouth daily.   feeding supplement Liqd Commonly known as: BOOST / RESOURCE BREEZE Take 1 Container by mouth every morning.   ferrous sulfate 220 (44 Fe) MG/5ML solution Take 220 mg by mouth daily. 3.62ml once a morning Mon,Wed, fRI   levothyroxine 50 MCG tablet Commonly known as: SYNTHROID TAKE ONE TABLET BY MOUTH EVERY  MORNING ON AN EMPTY STOMACH FOR THYROID   lithium carbonate 150 MG capsule Take 150 mg by mouth daily.   loperamide 2 MG tablet Commonly known as: Imodium A-D 1/2 to 1 tablet daily as needed to control diarrhea if you have 3 or more loose stool.   METAMUCIL FIBER PO 1 tablespoon by mouth once a morning. mix with water or juice   metoprolol tartrate 12.5 mg Tabs tablet Commonly known as: LOPRESSOR Take 12.5 mg by mouth 2 (two) times daily.   omeprazole 20 MG capsule Commonly known as: PRILOSEC Take 1 capsule (20 mg total) by mouth daily.   paliperidone 3 MG 24 hr tablet Commonly known as: INVEGA Take 3 mg by mouth daily.   simvastatin 5 MG tablet Commonly known as: ZOCOR TAKE ONE TABLET BY MOUTH EVERY NIGHT AT BEDTIME   zinc oxide 20 % ointment 1 application. To buttocks after every incontinent episode and as needed for redness. May keep at bedside. As Needed       Review of Systems  Constitutional: Positive for activity change, appetite change and unexpected weight change.  HENT: Negative.   Respiratory: Negative.   Cardiovascular: Negative.   Gastrointestinal: Negative.   Genitourinary: Negative.   Musculoskeletal: Positive for gait problem.  Skin: Negative.   Neurological: Positive for weakness.  Psychiatric/Behavioral: Positive for confusion.      Immunization History  Administered Date(s) Administered  . Influenza,inj,Quad PF,6+ Mos 08/27/2018  . Influenza-Unspecified 08/09/2009, 08/28/2010, 09/11/2011, 09/09/2012, 08/27/2013, 11/26/2013, 09/07/2014, 08/27/2015, 08/08/2016  . Pneumococcal Conjugate-13 06/10/2014  . Pneumococcal Polysaccharide-23 01/16/2010, 08/28/2010, 09/11/2011  . Pneumococcal-Unspecified 08/27/2015  . Td 08/03/2008  . Zoster 01/16/2010, 07/30/2011   Pertinent  Health Maintenance Due  Topic Date Due  . DEXA SCAN  10/04/1990  . INFLUENZA VACCINE  Completed  . PNA vac Low Risk Adult  Completed   Fall Risk  08/13/2018 07/16/2018  01/17/2018 11/07/2016 07/10/2016  Falls in the past year? Yes Yes Yes No Yes  Number falls in past yr: 1 2 or more 2 or more - 1  Injury with Fall? No Yes Yes - Yes  Comment - Patient has a bruise to her right forearm and shoulder.  broken ribs - -  Risk Factor Category  - High Fall Risk - - -  Risk for fall due to : - Impaired balance/gait;History of fall(s) - - -  Follow up - Follow up appointment - - -   Functional Status Survey:    Vitals:   11/06/19 0830  BP: (!) 149/56  Pulse: 78  Temp: 98 F (36.7 C)  Weight: 110 lb 12.8 oz (50.3 kg)  Height: 4\' 8"  (1.422 m)   Body mass index is 24.84 kg/m. Physical Exam Vitals reviewed.  HENT:     Head: Normocephalic.     Nose: Nose normal.     Mouth/Throat:     Mouth: Mucous membranes are moist.  Pharynx: Oropharynx is clear.  Eyes:     Pupils: Pupils are equal, round, and reactive to light.  Cardiovascular:     Rate and Rhythm: Normal rate and regular rhythm.     Pulses: Normal pulses.  Pulmonary:     Effort: Pulmonary effort is normal. No respiratory distress.     Breath sounds: Normal breath sounds. No wheezing or rales.  Abdominal:     General: Abdomen is flat.     Palpations: Abdomen is soft.  Musculoskeletal:        General: Swelling present.     Cervical back: Neck supple.  Skin:    General: Skin is warm.  Neurological:     General: No focal deficit present.     Mental Status: She is alert.     Comments: Was sleepy but will respond and then Answered Appropriately  Psychiatric:        Mood and Affect: Mood normal.        Thought Content: Thought content normal.     Labs reviewed: Recent Labs    05/25/19 0000 06/22/19 0000 08/20/19 0000  NA 138 140 141  K 4.4 4.9 4.0  CL  --  108 107  CO2  --  27 23*  BUN 37* 26* 20  CREATININE 1.8* 1.8* 1.7*  CALCIUM  --  9.3 9.5   Recent Labs    03/12/19 0000 05/25/19 0000 08/20/19 0000  AST 16 30 19   ALT 16 43* 19  ALKPHOS  --   --  46  ALBUMIN  --   --   3.6   Recent Labs    11/20/18 0000 05/25/19 0000 06/22/19 0000 08/20/19 0000  WBC 9.2  --  9.7 9.0  NEUTROABS  --   --   --  7,110  HGB 10.7* 10.1* 10.4* 10.6*  HCT 33*  33* 30* 32* 33*  PLT 322 288 272 341   Lab Results  Component Value Date   TSH 1.Lynn 08/20/2019   Lab Results  Component Value Date   HGBA1C 5.3 01/31/2018   Lab Results  Component Value Date   CHOL 152 07/17/2018   HDL 68 07/17/2018   LDLCALC 66 07/17/2018   TRIG 94 07/17/2018   CHOLHDL 2.2 07/17/2018    Significant Diagnostic Results in last 30 days:  No results found.  Assessment/Plan Excessive Sleepiness Will decrase the dose of Paliperidone to 1.5 mg and see if it helps this Will check CMP and CBC  Weight loss Conitnue Supportive care See if tapering some meds will help Cannot do Remeron due to Sleepiness  Bipolar affective disorder, remission status unspecified ([place]) On Litium ,Levels have been normal beore   CKD (chronic kidney disease) stage 4, GFR 15-29 ml/min (HCC) Repeat BMP  Essential hypertension Stable on Lopressor Hypothyroid TSH normal in 9/20 Anemia Hgb stable on iron  Hyperlipidemia On Low dose of statin Will discontinue with weight loss  Dysphagia, unspecified type Meds given Crushed Family/ staff Communication:   Labs/tests ordered:  CMP,CBC  Total time spent in this patient care encounter was  25_  minutes; greater than 50% of the visit spent counseling patient and staff, reviewing records , Labs and coordinating care for problems addressed at this encounter.

## 2019-11-10 ENCOUNTER — Encounter: Payer: Self-pay | Admitting: Nurse Practitioner

## 2019-11-10 ENCOUNTER — Non-Acute Institutional Stay (SKILLED_NURSING_FACILITY): Payer: Medicare Other | Admitting: Nurse Practitioner

## 2019-11-10 DIAGNOSIS — E034 Atrophy of thyroid (acquired): Secondary | ICD-10-CM | POA: Diagnosis not present

## 2019-11-10 DIAGNOSIS — M545 Low back pain, unspecified: Secondary | ICD-10-CM

## 2019-11-10 DIAGNOSIS — N184 Chronic kidney disease, stage 4 (severe): Secondary | ICD-10-CM

## 2019-11-10 DIAGNOSIS — D72829 Elevated white blood cell count, unspecified: Secondary | ICD-10-CM

## 2019-11-10 DIAGNOSIS — F319 Bipolar disorder, unspecified: Secondary | ICD-10-CM

## 2019-11-10 DIAGNOSIS — G8929 Other chronic pain: Secondary | ICD-10-CM

## 2019-11-10 DIAGNOSIS — I10 Essential (primary) hypertension: Secondary | ICD-10-CM | POA: Diagnosis not present

## 2019-11-10 NOTE — Assessment & Plan Note (Signed)
Baseline creat 1.7.

## 2019-11-10 NOTE — Assessment & Plan Note (Addendum)
Chronic, no change. 10/07/19 X-ray Lumbar spine: narrowing of the L2-L3, L3-L4, L4-L5 intervertebral disc spaces.

## 2019-11-10 NOTE — Assessment & Plan Note (Signed)
Stable, continue Levothyroxine, TSH 1.82 08/20/19

## 2019-11-10 NOTE — Progress Notes (Signed)
Location:   [facility] Room Number: 15 Place of Service:  SNF (31) Provider:  Dexton Zwilling X, NP  Treavor Blomquist X, NP  Patient Care Team: Noemie Devivo X, NP as PCP - General (Nurse Practitioner) Lorretta Harp, MD as Consulting Physician (Cardiology) Aquilla Hacker, MD as Referring Physician (Psychiatry) Irene Shipper, MD as Consulting Physician (Gastroenterology) Newton Pigg, MD as Consulting Physician (Obstetrics and Gynecology)  Extended Emergency Contact Information Primary Emergency Contact: Hobbs,Diane Address: [address]          [place], [place] [address] [name] of [place] Phone: [phone] Mobile Phone: [phone] Relation: Daughter  Code Status:  DNR Goals of care: Advanced Directive information Advanced Directives 08/17/2019  Does Patient Have a Medical Advance Directive? Yes  Type of Advance Directive Out of facility DNR (pink MOST or yellow form)  Does patient want to make changes to medical advance directive? No - Patient declined  Copy of [place] in Chart? -  Would patient like information on creating a medical advance directive? -  Pre-existing out of facility DNR order (yellow form or pink MOST form) -     Chief Complaint  Patient presents with  . Acute Visit    Leukocytosis    HPI:  Pt is a 85 y.o. female seen today for an acute visit for leukocytosis, Dr. Lyndel Safe ordered UA C/S for the patient's increased sleepiness and elevated white count, staff is locating wbc result. The patient denied suprapubic pain, chronic lower back pain has no change. The patient denied dysuria, urinary urgency, or frequency, but HPI is unreliable due to her dementia. Hx of HTN, blood pressure is controlled on Metoprolol 12.5mg  bid. Her mood is managed on Invega 1.5mg  qd, Lithium 150mg  qd, Duloxetine 60mg  qd, Wellbutin 150mg  qd. Hypothyroidism, taking Levothyroxine 11mcg qd. TSH 1.82 08/20/19   Past Medical History:   Diagnosis Date  . Anxiety and depression   . Aortic valve disease   . BBB (bundle branch block)    left, chronic  . Bipolar 1 disorder ([place])   . CKD (chronic kidney disease), stage III   . GERD (gastroesophageal reflux disease)   . Herpes zoster   . Hiatal hernia   . Hyperlipidemia   . Hypertension   . Hypertensive CKD (chronic kidney disease)   . Hypothyroid   . Osteoarthritis   . Osteopenia   . Renal artery stenosis (HCC)    left   Past Surgical History:  Procedure Laterality Date  . CARDIAC CATHETERIZATION  03/21/04   essentially normal coronary arteries with moderate decrease in left ventricular function, 80% ostial left renal artery stenosis  . COLONOSCOPY  11/11/2006   Dr. Scarlette Shorts , diverticulosis, colon polyps  . [place]  . PANENDOSCOPY  11/11/2006   Dr. Scarlette Shorts, esophageal stricture s/p balloon dilation, GERD, Zenker's Diverticulum    No Known Allergies  Allergies as of 11/10/2019   No Known Allergies     Medication List       Accurate as of November 10, 2019  1:45 PM. If you have any questions, ask your nurse or doctor.        STOP taking these medications   simvastatin 5 MG tablet Commonly known as: ZOCOR Stopped by: Amear Strojny X Goro Wenrick, NP     TAKE these medications   aspirin 81 MG chewable tablet Chew 81 mg by mouth daily.   buPROPion 150 MG 24 hr tablet Commonly known as:  WELLBUTRIN XL Take 150 mg by mouth. 3 tabs each morning   cholecalciferol 25 MCG (1000 UT) tablet Commonly known as: VITAMIN D3 Take 1,000 Units by mouth daily.   Deplin 7.5 MG Tabs Take 1 tablet by mouth daily.   DULoxetine 60 MG capsule Commonly known as: CYMBALTA Take 60 mg by mouth daily.   feeding supplement Liqd Commonly known as: BOOST / RESOURCE BREEZE Take 1 Container by mouth every morning.   ferrous sulfate 220 (44 Fe) MG/5ML solution Take 220 mg by mouth daily. 3.80ml once a morning Mon,Wed, fRI   levothyroxine 50 MCG tablet Commonly  known as: SYNTHROID TAKE ONE TABLET BY MOUTH EVERY MORNING ON AN EMPTY STOMACH FOR THYROID   lithium carbonate 150 MG capsule Take 150 mg by mouth daily.   loperamide 2 MG tablet Commonly known as: Imodium A-D 1/2 to 1 tablet daily as needed to control diarrhea if you have 3 or more loose stool.   METAMUCIL FIBER PO 1 tablespoon by mouth once a morning. mix with water or juice   metoprolol tartrate 12.5 mg Tabs tablet Commonly known as: LOPRESSOR Take 12.5 mg by mouth 2 (two) times daily.   omeprazole 20 MG capsule Commonly known as: PRILOSEC Take 1 capsule (20 mg total) by mouth daily.   paliperidone 3 MG 24 hr tablet Commonly known as: INVEGA Take 1.5 mg by mouth daily.   zinc oxide 20 % ointment 1 application. To buttocks after every incontinent episode and as needed for redness. May keep at bedside. As Needed      ROS was provided with assistance of staff.  Review of Systems  Constitutional: Positive for fatigue. Negative for activity change, appetite change, chills, diaphoresis and fever.       Excessive sleepiness.   HENT: Positive for hearing loss. Negative for congestion and voice change.   Eyes: Negative for visual disturbance.  Respiratory: Negative for cough, shortness of breath and wheezing.   Cardiovascular: Positive for leg swelling. Negative for chest pain and palpitations.  Gastrointestinal: Negative for abdominal distention, abdominal pain, constipation, diarrhea, nausea and vomiting.  Genitourinary: Negative for difficulty urinating, dysuria, frequency and urgency.  Musculoskeletal: Positive for arthralgias, back pain and gait problem.  Skin: Negative for color change and pallor.  Neurological: Negative for dizziness, speech difficulty, weakness and headaches.       Memory lapses  Psychiatric/Behavioral: Negative for agitation, behavioral problems, hallucinations and sleep disturbance. The patient is not nervous/anxious.        The patient appears  sleepy    Immunization History  Administered Date(s) Administered  . Influenza,inj,Quad PF,6+ Mos 08/27/2018  . Influenza-Unspecified 08/09/2009, 08/28/2010, 09/11/2011, 09/09/2012, 08/27/2013, 11/26/2013, 09/07/2014, 08/27/2015, 08/08/2016  . Pneumococcal Conjugate-13 06/10/2014  . Pneumococcal Polysaccharide-23 01/16/2010, 08/28/2010, 09/11/2011  . Pneumococcal-Unspecified 08/27/2015  . Td 08/03/2008  . Zoster 01/16/2010, 07/30/2011   Pertinent  Health Maintenance Due  Topic Date Due  . DEXA SCAN  10/04/1990  . INFLUENZA VACCINE  Completed  . PNA vac Low Risk Adult  Completed   Fall Risk  08/13/2018 07/16/2018 01/17/2018 11/07/2016 07/10/2016  Falls in the past year? Yes Yes Yes No Yes  Number falls in past yr: 1 2 or more 2 or more - 1  Injury with Fall? No Yes Yes - Yes  Comment - Patient has a bruise to her right forearm and shoulder.  broken ribs - -  Risk Factor Category  - High Fall Risk - - -  Risk for fall due to : -  Impaired balance/gait;History of fall(s) - - -  Follow up - Follow up appointment - - -   Functional Status Survey:    Vitals:   11/10/19 0812  BP: (!) 149/56  Pulse: 78  Resp: 20  Temp: (!) 96.5 F (35.8 C)  SpO2: 90%  Weight: 110 lb 12.8 oz (50.3 kg)  Height: 4\' 8"  (1.422 m)   Body mass index is 24.84 kg/m. Physical Exam Vitals and nursing note reviewed.  Constitutional:      General: She is not in acute distress.    Appearance: Normal appearance. She is not ill-appearing or toxic-appearing.  HENT:     Head: Normocephalic and atraumatic.     Nose: Nose normal.     Mouth/Throat:     Mouth: Mucous membranes are moist.  Eyes:     Extraocular Movements: Extraocular movements intact.     Conjunctiva/sclera: Conjunctivae normal.     Pupils: Pupils are equal, round, and reactive to light.  Neck:     Vascular: No carotid bruit.  Cardiovascular:     Rate and Rhythm: Normal rate and regular rhythm.     Heart sounds: No murmur.  Pulmonary:      Breath sounds: No wheezing or rales.  Abdominal:     General: Bowel sounds are normal. There is no distension.     Palpations: Abdomen is soft.     Tenderness: There is no abdominal tenderness. There is no right CVA tenderness, left CVA tenderness, guarding or rebound.  Musculoskeletal:     Cervical back: Normal range of motion and neck supple. No rigidity or tenderness.     Right lower leg: Edema present.     Left lower leg: Edema present.     Comments: Trace edema BLE  Lymphadenopathy:     Cervical: No cervical adenopathy.  Skin:    General: Skin is warm and dry.  Neurological:     General: No focal deficit present.     Mental Status: She is alert. Mental status is at baseline.     Motor: No weakness.     Coordination: Coordination normal.     Gait: Gait abnormal.  Psychiatric:        Mood and Affect: Mood normal.        Behavior: Behavior normal.        Thought Content: Thought content normal.     Comments: Appears sleepy     Labs reviewed: Recent Labs    05/25/19 0000 06/22/19 0000 08/20/19 0000  NA 138 140 141  K 4.4 4.9 4.0  CL  --  108 107  CO2  --  27 23*  BUN 37* 26* 20  CREATININE 1.8* 1.8* 1.7*  CALCIUM  --  9.3 9.5   Recent Labs    03/12/19 0000 05/25/19 0000 08/20/19 0000  AST 16 30 19   ALT 16 43* 19  ALKPHOS  --   --  46  ALBUMIN  --   --  3.6   Recent Labs    11/20/18 0000 05/25/19 0000 06/22/19 0000 08/20/19 0000  WBC 9.2  --  9.7 9.0  NEUTROABS  --   --   --  7,110  HGB 10.7* 10.1* 10.4* 10.6*  HCT 33*  33* 30* 32* 33*  PLT 322 288 272 341   Lab Results  Component Value Date   TSH 1.82 08/20/2019   Lab Results  Component Value Date   HGBA1C 5.3 01/31/2018   Lab Results  Component Value Date  CHOL 152 07/17/2018   HDL 68 07/17/2018   LDLCALC 66 07/17/2018   TRIG 94 07/17/2018   CHOLHDL 2.2 07/17/2018    Significant Diagnostic Results in last 30 days:  No results found.  Assessment/Plan Leukocytosis In setting of  excessive sleepiness, wbc 10.9 neutrophils 79.1%, obtain UA C/S to r/o UTI.   Lower back pain Chronic, no change. 10/07/19 X-ray Lumbar spine: narrowing of the L2-L3, L3-L4, L4-L5 intervertebral disc spaces.    Essential hypertension Blood pressure is controlled, continue Metoprolol.   Hypothyroidism Stable, continue Levothyroxine, TSH 1.82 08/20/19  Bipolar disorder ([place]) Stable, continue Invega 1.5mg  qd reduced since 10/30/19 in setting of the patient's sleepiness-too soon to eva, Lithium 150mg  qd, Duloxetine 60mg  qd, Wellbutin 150mg  qd.  CKD (chronic kidney disease) stage 4, GFR 15-29 ml/min (HCC) Baseline creat 1.7.      Family/ staff Communication: plan of care reviewed with the patient and charge nurse.   Labs/tests ordered:  Pending UA C/S  Time spend 25 minutes.

## 2019-11-10 NOTE — Assessment & Plan Note (Signed)
Blood pressure is controlled, continue Metoprolol. 

## 2019-11-10 NOTE — Assessment & Plan Note (Addendum)
In setting of excessive sleepiness, wbc 10.9 neutrophils 79.1%, obtain UA C/S to r/o UTI.

## 2019-11-10 NOTE — Assessment & Plan Note (Addendum)
Stable, continue Invega 1.5mg  qd reduced since 10/30/19 in setting of the patient's sleepiness-too soon to eva, Lithium 150mg  qd, Duloxetine 60mg  qd, Wellbutin 150mg  qd.

## 2019-11-16 ENCOUNTER — Encounter: Payer: Self-pay | Admitting: Internal Medicine

## 2019-11-16 ENCOUNTER — Non-Acute Institutional Stay (SKILLED_NURSING_FACILITY): Payer: Medicare Other | Admitting: Internal Medicine

## 2019-11-16 DIAGNOSIS — N3000 Acute cystitis without hematuria: Secondary | ICD-10-CM | POA: Diagnosis not present

## 2019-11-16 DIAGNOSIS — D638 Anemia in other chronic diseases classified elsewhere: Secondary | ICD-10-CM | POA: Diagnosis not present

## 2019-11-16 DIAGNOSIS — R634 Abnormal weight loss: Secondary | ICD-10-CM | POA: Diagnosis not present

## 2019-11-16 DIAGNOSIS — R5383 Other fatigue: Secondary | ICD-10-CM | POA: Diagnosis not present

## 2019-11-16 NOTE — Progress Notes (Signed)
Location:     [facility] Room Number: 15 Place of Service:  SNF (31) Provider:  Starr Lake MD  Mast, Man X, NP  Patient Care Team: Mast, Man X, NP as PCP - General (Nurse Practitioner) Lorretta Harp, MD as Consulting Physician (Cardiology) Aquilla Hacker, MD as Referring Physician (Psychiatry) Irene Shipper, MD as Consulting Physician (Gastroenterology) Newton Pigg, MD as Consulting Physician (Obstetrics and Gynecology)  Extended Emergency Contact Information Primary Emergency Contact: Hobbs,Diane Address: [address]          [place], [place] [address] [name] of [place] Phone: [phone] Mobile Phone: [phone] Relation: Daughter  Code Status:  DNR Goals of care: Advanced Directive information Advanced Directives 08/17/2019  Does Patient Have a Medical Advance Directive? Yes  Type of Advance Directive Out of facility DNR (pink MOST or yellow form)  Does patient want to make changes to medical advance directive? No - Patient declined  Copy of [place] in Chart? -  Would patient like information on creating a medical advance directive? -  Pre-existing out of facility DNR order (yellow form or pink MOST form) -     Chief Complaint  Patient presents with  . Acute Visit    UTI    HPI:  Pt is a 85 y.o. female seen today for an acute visit for UTI and Sleepiness  Patient has a history of hypertension, hypothyroidism, Hyperlipidemia, CKDstage 3,GERD, Anemiamild cognitive impairment,falls And Bipolar Illness follows with Outsidepsychiatrist  [place] [facility] [place] [facility] [place] [facility]. Her dose of Palliperidone was decreased last visit She has lost more weight since my visit few weeks ago. She is more awake and responsive today. No fever or any other complains  Her UA was checked as she had Leucocytosis. It is positive with Group b Strep.More then 100k colonies    Past Medical History:    Diagnosis Date  . Anxiety and depression   . Aortic valve disease   . BBB (bundle branch block)    left, chronic  . Bipolar 1 disorder ([place])   . CKD (chronic kidney disease), stage III   . GERD (gastroesophageal reflux disease)   . Herpes zoster   . Hiatal hernia   . Hyperlipidemia   . Hypertension   . Hypertensive CKD (chronic kidney disease)   . Hypothyroid   . Osteoarthritis   . Osteopenia   . Renal artery stenosis (HCC)    left   Past Surgical History:  Procedure Laterality Date  . CARDIAC CATHETERIZATION  03/21/04   essentially normal coronary arteries with moderate decrease in left ventricular function, 80% ostial left renal artery stenosis  . COLONOSCOPY  11/11/2006   Dr. Scarlette Shorts , diverticulosis, colon polyps  . [place]  . PANENDOSCOPY  11/11/2006   Dr. Scarlette Shorts, esophageal stricture s/p balloon dilation, GERD, Zenker's Diverticulum    No Known Allergies  Allergies as of 11/16/2019   No Known Allergies     Medication List       Accurate as of November 16, 2019  2:59 PM. If you have any questions, ask your nurse or doctor.        aspirin 81 MG chewable tablet Chew 81 mg by mouth daily.   buPROPion 150 MG 24 hr tablet Commonly known as: WELLBUTRIN XL Take 150 mg by mouth. 3 tabs each morning   cholecalciferol 25 MCG (1000 UT) tablet Commonly known as: VITAMIN D3 Take 1,000  Units by mouth daily.   Deplin 7.5 MG Tabs Take 1 tablet by mouth daily.   DULoxetine 60 MG capsule Commonly known as: CYMBALTA Take 60 mg by mouth daily.   feeding supplement Liqd Commonly known as: BOOST / RESOURCE BREEZE Take 1 Container by mouth every morning.   ferrous sulfate 220 (44 Fe) MG/5ML solution Take 220 mg by mouth daily. 3.51ml once a morning Mon,Wed, fRI   levothyroxine 50 MCG tablet Commonly known as: SYNTHROID TAKE ONE TABLET BY MOUTH EVERY MORNING ON AN EMPTY STOMACH FOR THYROID   lithium carbonate 150 MG capsule Take 150 mg by  mouth daily.   loperamide 2 MG tablet Commonly known as: Imodium A-D 1/2 to 1 tablet daily as needed to control diarrhea if you have 3 or more loose stool.   METAMUCIL FIBER PO 1 tablespoon by mouth once a morning. mix with water or juice   metoprolol tartrate 12.5 mg Tabs tablet Commonly known as: LOPRESSOR Take 12.5 mg by mouth 2 (two) times daily.   omeprazole 20 MG capsule Commonly known as: PRILOSEC Take 1 capsule (20 mg total) by mouth daily.   paliperidone 3 MG 24 hr tablet Commonly known as: INVEGA Take 1.5 mg by mouth daily.   zinc oxide 20 % ointment 1 application. To buttocks after every incontinent episode and as needed for redness. May keep at bedside. As Needed       Review of Systems  Constitutional: Positive for activity change, appetite change and unexpected weight change.  HENT: Negative.   Respiratory: Negative.   Cardiovascular: Negative.   Gastrointestinal: Negative.   Genitourinary: Negative.   Musculoskeletal: Positive for gait problem.  Neurological: Positive for weakness.  Psychiatric/Behavioral: Positive for confusion and dysphoric mood.    Immunization History  Administered Date(s) Administered  . Influenza,inj,Quad PF,6+ Mos 08/27/2018  . Influenza-Unspecified 08/09/2009, 08/28/2010, 09/11/2011, 09/09/2012, 08/27/2013, 11/26/2013, 09/07/2014, 08/27/2015, 08/08/2016  . Pneumococcal Conjugate-13 06/10/2014  . Pneumococcal Polysaccharide-23 01/16/2010, 08/28/2010, 09/11/2011  . Pneumococcal-Unspecified 08/27/2015  . Td 08/03/2008  . Zoster 01/16/2010, 07/30/2011   Pertinent  Health Maintenance Due  Topic Date Due  . DEXA SCAN  10/04/1990  . INFLUENZA VACCINE  Completed  . PNA vac Low Risk Adult  Completed   Fall Risk  08/13/2018 07/16/2018 01/17/2018 11/07/2016 07/10/2016  Falls in the past year? Yes Yes Yes No Yes  Number falls in past yr: 1 2 or more 2 or more - 1  Injury with Fall? No Yes Yes - Yes  Comment - Patient has a bruise to  her right forearm and shoulder.  broken ribs - -  Risk Factor Category  - High Fall Risk - - -  Risk for fall due to : - Impaired balance/gait;History of fall(s) - - -  Follow up - Follow up appointment - - -   Functional Status Survey:    Vitals:   11/16/19 1456  BP: (!) 165/68  Pulse: 83  Resp: 16  Temp: 98.1 F (36.7 C)  SpO2: 94%  Weight: 105 lb (47.6 kg)  Height: 4\' 8"  (1.422 m)   Body mass index is 23.54 kg/m. Physical Exam Vitals reviewed.  Constitutional:      Appearance: Normal appearance.  HENT:     Head: Normocephalic.     Nose: Nose normal.     Mouth/Throat:     Mouth: Mucous membranes are moist.     Pharynx: Oropharynx is clear.  Eyes:     Pupils: Pupils are equal, round, and reactive  to light.  Cardiovascular:     Rate and Rhythm: Normal rate and regular rhythm.     Pulses: Normal pulses.  Pulmonary:     Effort: Pulmonary effort is normal.     Breath sounds: Normal breath sounds.  Abdominal:     General: Abdomen is flat. Bowel sounds are normal.     Palpations: Abdomen is soft.  Musculoskeletal:        General: No swelling.     Cervical back: Neck supple.  Skin:    General: Skin is warm.  Neurological:     General: No focal deficit present.     Mental Status: She is alert.  Psychiatric:        Mood and Affect: Mood normal.        Thought Content: Thought content normal.        Judgment: Judgment normal.     Labs reviewed: Recent Labs    05/25/19 0000 06/22/19 0000 08/20/19 0000  NA 138 140 141  K 4.4 4.9 4.0  CL  --  108 107  CO2  --  27 23*  BUN 37* 26* 20  CREATININE 1.8* 1.8* 1.7*  CALCIUM  --  9.3 9.5   Recent Labs    03/12/19 0000 05/25/19 0000 08/20/19 0000  AST 16 30 19   ALT 16 43* 19  ALKPHOS  --   --  46  ALBUMIN  --   --  3.6   Recent Labs    11/20/18 0000 05/25/19 0000 06/22/19 0000 08/20/19 0000  WBC 9.2  --  9.7 9.0  NEUTROABS  --   --   --  7,110  HGB 10.7* 10.1* 10.4* 10.6*  HCT 33*  33* 30* 32*  33*  PLT 322 288 272 341   Lab Results  Component Value Date   TSH 1.82 08/20/2019   Lab Results  Component Value Date   HGBA1C 5.3 01/31/2018   Lab Results  Component Value Date   CHOL 152 07/17/2018   HDL 68 07/17/2018   LDLCALC 66 07/17/2018   TRIG 94 07/17/2018   CHOLHDL 2.2 07/17/2018    Significant Diagnostic Results in last 30 days:  No results found.  Assessment/Plan  Acute cystitis without hematuria Will treat with Keflex 250 mg QID for 7 days  Excessive sleepiness Better. Will discontinue Palliperidone  Weight loss Already on number of meds Seems like overall Decline in her cognition  Anemia, chronic disease Hgb Stable on Iron  CKD (chronic kidney disease) stage 4, GFR 15-29 ml/min (HCC) Creat stable Essential hypertension Stable on Lopressor Will check BP every day as it has been running High recently Hypothyroid TSH normal in 9/20 Hyperlipidemia Statin Discontinued due to weight loss Dysphagia, unspecified type Meds given Crushed Family/ staff Communication:   Labs/tests ordered:   Total time spent in this patient care encounter was  25_  minutes; greater than 50% of the visit spent counseling patient and staff, reviewing records , Labs and coordinating care for problems addressed at this encounter.

## 2019-11-19 ENCOUNTER — Non-Acute Institutional Stay (SKILLED_NURSING_FACILITY): Payer: Medicare Other | Admitting: Nurse Practitioner

## 2019-11-19 ENCOUNTER — Encounter: Payer: Self-pay | Admitting: Nurse Practitioner

## 2019-11-19 DIAGNOSIS — I10 Essential (primary) hypertension: Secondary | ICD-10-CM

## 2019-11-19 DIAGNOSIS — S61411A Laceration without foreign body of right hand, initial encounter: Secondary | ICD-10-CM | POA: Diagnosis not present

## 2019-11-19 DIAGNOSIS — D638 Anemia in other chronic diseases classified elsewhere: Secondary | ICD-10-CM

## 2019-11-19 DIAGNOSIS — G3184 Mild cognitive impairment, so stated: Secondary | ICD-10-CM | POA: Diagnosis not present

## 2019-11-19 NOTE — Assessment & Plan Note (Signed)
Dorsum right hand, approximated with steri strips, no s/s of infection, monitor for healing and steri strips self exfoliation.

## 2019-11-19 NOTE — Progress Notes (Signed)
Location:    [place] Room Number: 15 Place of Service:  SNF (31) Provider:  Herberta Pickron X, NP  Carlton Sweaney X, NP  Patient Care Team: Melik Blancett X, NP as PCP - General (Nurse Practitioner) Lorretta Harp, MD as Consulting Physician (Cardiology) Aquilla Hacker, MD as Referring Physician (Psychiatry) Irene Shipper, MD as Consulting Physician (Gastroenterology) Newton Pigg, MD as Consulting Physician (Obstetrics and Gynecology)  Extended Emergency Contact Information Primary Emergency Contact: Hobbs,Diane Address: [address]          [place], [place] [address] [name] of [place] Phone: [phone] Mobile Phone: [phone] Relation: Daughter  Code Status:  DNR Goals of care: Advanced Directive information Advanced Directives 08/17/2019  Does Patient Have a Medical Advance Directive? Yes  Type of Advance Directive Out of facility DNR (pink MOST or yellow form)  Does patient want to make changes to medical advance directive? No - Patient declined  Copy of [place] in Chart? -  Would patient like information on creating a medical advance directive? -  Pre-existing out of facility DNR order (yellow form or pink MOST form) -     Chief Complaint  Patient presents with  . Acute Visit    Skin tear right forehand    HPI:  Pt is a 85 y.o. female seen today for an acute visit for skin tear left dorsum hand, well approximated with steri strips, no s/s of infection. HPI was provided with assistance of staff. The patient resides in SNF [facility] for safety, care assistance. HTN, blood pressure is controlled on Metoprolol 12.5mg  bid, ASA 81mg  qd. Anemia, on Fe daily 3x/week, Hgb 10-11.    Past Medical History:  Diagnosis Date  . Anxiety and depression   . Aortic valve disease   . BBB (bundle branch block)    left, chronic  . Bipolar 1 disorder ([place])   . CKD (chronic kidney disease), stage III   . GERD (gastroesophageal reflux  disease)   . Herpes zoster   . Hiatal hernia   . Hyperlipidemia   . Hypertension   . Hypertensive CKD (chronic kidney disease)   . Hypothyroid   . Osteoarthritis   . Osteopenia   . Renal artery stenosis (HCC)    left   Past Surgical History:  Procedure Laterality Date  . CARDIAC CATHETERIZATION  03/21/04   essentially normal coronary arteries with moderate decrease in left ventricular function, 80% ostial left renal artery stenosis  . COLONOSCOPY  11/11/2006   Dr. Scarlette Shorts , diverticulosis, colon polyps  . [place]  . PANENDOSCOPY  11/11/2006   Dr. Scarlette Shorts, esophageal stricture s/p balloon dilation, GERD, Zenker's Diverticulum    No Known Allergies  Allergies as of 11/19/2019   No Known Allergies     Medication List       Accurate as of November 19, 2019 12:01 PM. If you have any questions, ask your nurse or doctor.        STOP taking these medications   paliperidone 3 MG 24 hr tablet Commonly known as: INVEGA Stopped by: Darus Hershman X Greco Gastelum, NP     TAKE these medications   aspirin 81 MG chewable tablet Chew 81 mg by mouth daily.   buPROPion 150 MG 24 hr tablet Commonly known as: WELLBUTRIN XL Take 150 mg by mouth. 3 tabs each morning   cholecalciferol 25 MCG (1000 UT) tablet Commonly known as: VITAMIN D3 Take 1,000 Units by  mouth daily.   Deplin 7.5 MG Tabs Take 1 tablet by mouth daily.   DULoxetine 60 MG capsule Commonly known as: CYMBALTA Take 60 mg by mouth daily.   feeding supplement Liqd Commonly known as: BOOST / RESOURCE BREEZE Take 1 Container by mouth every morning.   ferrous sulfate 220 (44 Fe) MG/5ML solution Take 220 mg by mouth daily. 3.72ml once a morning Mon,Wed, fRI   levothyroxine 50 MCG tablet Commonly known as: SYNTHROID TAKE ONE TABLET BY MOUTH EVERY MORNING ON AN EMPTY STOMACH FOR THYROID   lithium carbonate 150 MG capsule Take 150 mg by mouth daily.   loperamide 2 MG tablet Commonly known as: Imodium A-D 1/2  to 1 tablet daily as needed to control diarrhea if you have 3 or more loose stool.   METAMUCIL FIBER PO 1 tablespoon by mouth once a morning. mix with water or juice   metoprolol tartrate 12.5 mg Tabs tablet Commonly known as: LOPRESSOR Take 12.5 mg by mouth 2 (two) times daily.   omeprazole 20 MG capsule Commonly known as: PRILOSEC Take 1 capsule (20 mg total) by mouth daily.   zinc oxide 20 % ointment 1 application. To buttocks after every incontinent episode and as needed for redness. May keep at bedside. As Needed      ROS was provided with assistance of staff.  Review of Systems  Constitutional: Negative for activity change, appetite change, chills, diaphoresis, fatigue and fever.  HENT: Positive for hearing loss. Negative for congestion and voice change.   Respiratory: Negative for cough, shortness of breath and wheezing.   Cardiovascular: Negative for chest pain, palpitations and leg swelling.  Gastrointestinal: Negative for abdominal distention, abdominal pain, constipation, diarrhea, nausea and vomiting.  Genitourinary: Negative for difficulty urinating, dysuria and urgency.  Musculoskeletal: Positive for gait problem.  Skin: Positive for wound.       Back of the right hand skin tear.   Neurological: Negative for dizziness, speech difficulty, weakness and headaches.       Memory lapses.   Hematological: Bruises/bleeds easily.  Psychiatric/Behavioral: Negative for agitation, behavioral problems, hallucinations and sleep disturbance. The patient is not nervous/anxious.     Immunization History  Administered Date(s) Administered  . Influenza,inj,Quad PF,6+ Mos 08/27/2018  . Influenza-Unspecified 08/09/2009, 08/28/2010, 09/11/2011, 09/09/2012, 08/27/2013, 11/26/2013, 09/07/2014, 08/27/2015, 08/08/2016  . Pneumococcal Conjugate-13 06/10/2014  . Pneumococcal Polysaccharide-23 01/16/2010, 08/28/2010, 09/11/2011  . Pneumococcal-Unspecified 08/27/2015  . Td 08/03/2008  .  Zoster 01/16/2010, 07/30/2011   Pertinent  Health Maintenance Due  Topic Date Due  . DEXA SCAN  10/04/1990  . INFLUENZA VACCINE  Completed  . PNA vac Low Risk Adult  Completed   Fall Risk  08/13/2018 07/16/2018 01/17/2018 11/07/2016 07/10/2016  Falls in the past year? Yes Yes Yes No Yes  Number falls in past yr: 1 2 or more 2 or more - 1  Injury with Fall? No Yes Yes - Yes  Comment - Patient has a bruise to her right forearm and shoulder.  broken ribs - -  Risk Factor Category  - High Fall Risk - - -  Risk for fall due to : - Impaired balance/gait;History of fall(s) - - -  Follow up - Follow up appointment - - -   Functional Status Survey:    Vitals:   11/19/19 1122  BP: (!) 128/54  Pulse: 67  Resp: 18  Temp: 98.5 F (36.9 C)  SpO2: 96%  Weight: 105 lb (47.6 kg)  Height: 4\' 8"  (1.422 m)  Body mass index is 23.54 kg/m. Physical Exam Vitals and nursing note reviewed.  Constitutional:      General: She is not in acute distress.    Appearance: Normal appearance. She is not ill-appearing, toxic-appearing or diaphoretic.  HENT:     Head: Normocephalic and atraumatic.     Nose: Nose normal.     Mouth/Throat:     Mouth: Mucous membranes are moist.  Eyes:     Extraocular Movements: Extraocular movements intact.     Conjunctiva/sclera: Conjunctivae normal.     Pupils: Pupils are equal, round, and reactive to light.  Cardiovascular:     Rate and Rhythm: Normal rate.     Heart sounds: No murmur.  Pulmonary:     Breath sounds: No wheezing, rhonchi or rales.  Abdominal:     General: Bowel sounds are normal. There is no distension.     Palpations: Abdomen is soft.     Tenderness: There is no abdominal tenderness. There is no right CVA tenderness, guarding or rebound.     Hernia: No hernia is present.  Musculoskeletal:     Cervical back: Normal range of motion and neck supple.     Right lower leg: No edema.     Left lower leg: No edema.  Skin:    General: Skin is warm and  dry.     Findings: Bruising present.     Comments: Dorsum right hand skin tear approximated with steri strips, no s/s of infection, no pain when making a fist.   Neurological:     General: No focal deficit present.     Mental Status: She is alert. Mental status is at baseline.     Motor: No weakness.     Coordination: Coordination abnormal.     Gait: Gait abnormal.     Comments: Oriented to self  Psychiatric:     Comments: Confused, flat affect.      Labs reviewed: Recent Labs    05/25/19 0000 06/22/19 0000 08/20/19 0000  NA 138 140 141  K 4.4 4.9 4.0  CL  --  108 107  CO2  --  27 23*  BUN 37* 26* 20  CREATININE 1.8* 1.8* 1.7*  CALCIUM  --  9.3 9.5   Recent Labs    03/12/19 0000 05/25/19 0000 08/20/19 0000  AST 16 30 19   ALT 16 43* 19  ALKPHOS  --   --  46  ALBUMIN  --   --  3.6   Recent Labs    11/20/18 0000 05/25/19 0000 06/22/19 0000 08/20/19 0000  WBC 9.2  --  9.7 9.0  NEUTROABS  --   --   --  7,110  HGB 10.7* 10.1* 10.4* 10.6*  HCT 33*  33* 30* 32* 33*  PLT 322 288 272 341   Lab Results  Component Value Date   TSH 1.82 08/20/2019   Lab Results  Component Value Date   HGBA1C 5.3 01/31/2018   Lab Results  Component Value Date   CHOL 152 07/17/2018   HDL 68 07/17/2018   LDLCALC 66 07/17/2018   TRIG 94 07/17/2018   CHOLHDL 2.2 07/17/2018    Significant Diagnostic Results in last 30 days:  No results found.  Assessment/Plan Skin tear of hand without complication, right, initial encounter Dorsum right hand, approximated with steri strips, no s/s of infection, monitor for healing and steri strips self exfoliation.   Mild cognitive impairment Continue SNF [facility] for safety, care assistance,   Essential hypertension  Blood pressure is controlled, continue Metoprolol 12.5mg  bid,  ASA 81mg  qd.   Anemia, chronic disease Stable, continue Fe 3x/wk, Hgb 10-11 her baseline.      Family/ staff Communication: plan of care reviewed with the  patient and charge nurse.   Labs/tests ordered:  none  Time spend 25 minutes.

## 2019-11-19 NOTE — Assessment & Plan Note (Signed)
Continue SNF [facility] for safety, care assistance,

## 2019-11-19 NOTE — Assessment & Plan Note (Signed)
Blood pressure is controlled, continue Metoprolol 12.5mg  bid,  ASA 81mg  qd.

## 2019-11-19 NOTE — Assessment & Plan Note (Signed)
Stable, continue Fe 3x/wk, Hgb 10-11 her baseline.

## 2019-11-30 ENCOUNTER — Non-Acute Institutional Stay (SKILLED_NURSING_FACILITY): Payer: Medicare PPO | Admitting: Internal Medicine

## 2019-11-30 ENCOUNTER — Encounter: Payer: Self-pay | Admitting: Internal Medicine

## 2019-11-30 DIAGNOSIS — I10 Essential (primary) hypertension: Secondary | ICD-10-CM | POA: Diagnosis not present

## 2019-11-30 DIAGNOSIS — G3184 Mild cognitive impairment, so stated: Secondary | ICD-10-CM | POA: Diagnosis not present

## 2019-11-30 DIAGNOSIS — N184 Chronic kidney disease, stage 4 (severe): Secondary | ICD-10-CM

## 2019-11-30 DIAGNOSIS — R1310 Dysphagia, unspecified: Secondary | ICD-10-CM | POA: Diagnosis not present

## 2019-11-30 DIAGNOSIS — F319 Bipolar disorder, unspecified: Secondary | ICD-10-CM

## 2019-11-30 NOTE — Progress Notes (Signed)
Location:   [facility] Room Number: 15 Place of Service:  SNF [phone]) Provider:  Veleta Miners MD  Mast, Man X, NP  Patient Care Team: Mast, Man X, NP as PCP - General (Nurse Practitioner) Lorretta Harp, MD as Consulting Physician (Cardiology) Aquilla Hacker, MD as Referring Physician (Psychiatry) Irene Shipper, MD as Consulting Physician (Gastroenterology) Newton Pigg, MD as Consulting Physician (Obstetrics and Gynecology)  Extended Emergency Contact Information Primary Emergency Contact: Hobbs,Diane Address: [address]          [place], [place] [address] [name] of [place] Phone: [phone] Mobile Phone: [phone] Relation: Daughter  Code Status:  DNR Goals of care: Advanced Directive information Advanced Directives 08/17/2019  Does Patient Have a Medical Advance Directive? Yes  Type of Advance Directive Out of facility DNR (pink MOST or yellow form)  Does patient want to make changes to medical advance directive? No - Patient declined  Copy of [place] in Chart? -  Would patient like information on creating a medical advance directive? -  Pre-existing out of facility DNR order (yellow form or pink MOST form) -     Chief Complaint  Patient presents with  . Acute Visit    Dysphagia    HPI:  Pt is a 86 y.o. female seen today for an acute visit for Dysphagia, Weight Loss and Poor Appetite  Patient has a history of hypertension, hypothyroidism, Hyperlipidemia, CKDstage 3,GERD, Anemiamild cognitive impairment,falls And Bipolar Illness follows with Outsidepsychiatrist  [place] to [facility] Her Lorayne Bender was discontinued to see if it helps with her Excessive Sleepiness Patient is doing well and is more alert but now is having some behavior issues Also she continues to not eat. Has lost more weight.  Also now Nurses have noticed that she having Dysphagia with Holding food and not  swallowing properly Speech Therapy thinks it is related to her Cognitive decline She was more Alert today and was responding. Denied any complain to me Answer Appropriatley    Past Medical History:  Diagnosis Date  . Anxiety and depression   . Aortic valve disease   . BBB (bundle branch block)    left, chronic  . Bipolar 1 disorder ([place])   . CKD (chronic kidney disease), stage III   . GERD (gastroesophageal reflux disease)   . Herpes zoster   . Hiatal hernia   . Hyperlipidemia   . Hypertension   . Hypertensive CKD (chronic kidney disease)   . Hypothyroid   . Osteoarthritis   . Osteopenia   . Renal artery stenosis (HCC)    left   Past Surgical History:  Procedure Laterality Date  . CARDIAC CATHETERIZATION  03/21/04   essentially normal coronary arteries with moderate decrease in left ventricular function, 80% ostial left renal artery stenosis  . COLONOSCOPY  11/11/2006   Dr. Scarlette Shorts , diverticulosis, colon polyps  . [place]  . PANENDOSCOPY  11/11/2006   Dr. Scarlette Shorts, esophageal stricture s/p balloon dilation, GERD, Zenker's Diverticulum    No Known Allergies  Allergies as of 11/30/2019   No Known Allergies     Medication List       Accurate as of November 30, 2019 11:11 AM. If you have any questions, ask your nurse or doctor.        aspirin 81 MG chewable tablet Chew 81 mg by mouth daily.   buPROPion 150 MG 24 hr tablet Commonly known  as: WELLBUTRIN XL Take 150 mg by mouth. 3 tabs each morning   cholecalciferol 25 MCG (1000 UT) tablet Commonly known as: VITAMIN D3 Take 1,000 Units by mouth daily.   Deplin 7.5 MG Tabs Take 1 tablet by mouth daily.   DULoxetine 60 MG capsule Commonly known as: CYMBALTA Take 60 mg by mouth daily.   feeding supplement Liqd Commonly known as: BOOST / RESOURCE BREEZE Take 1 Container by mouth every morning.   ferrous sulfate 220 (44 Fe) MG/5ML solution Take 220 mg by mouth daily. 3.29ml once a morning  Mon,Wed, fRI   levothyroxine 50 MCG tablet Commonly known as: SYNTHROID TAKE ONE TABLET BY MOUTH EVERY MORNING ON AN EMPTY STOMACH FOR THYROID   lithium carbonate 150 MG capsule Take 150 mg by mouth daily.   loperamide 2 MG tablet Commonly known as: Imodium A-D 1/2 to 1 tablet daily as needed to control diarrhea if you have 3 or more loose stool.   METAMUCIL FIBER PO 1 tablespoon by mouth once a morning. mix with water or juice   metoprolol tartrate 12.5 mg Tabs tablet Commonly known as: LOPRESSOR Take 12.5 mg by mouth 2 (two) times daily.   omeprazole 20 MG capsule Commonly known as: PRILOSEC Take 1 capsule (20 mg total) by mouth daily.   zinc oxide 20 % ointment 1 application. To buttocks after every incontinent episode and as needed for redness. May keep at bedside. As Needed       Review of Systems  Constitutional: Positive for appetite change and unexpected weight change.  HENT: Negative.   Respiratory: Negative.   Cardiovascular: Negative.   Gastrointestinal: Negative.   Genitourinary: Negative.   Musculoskeletal: Negative.   Skin: Negative.   Neurological: Positive for weakness.  Psychiatric/Behavioral: Positive for confusion and dysphoric mood.    Immunization History  Administered Date(s) Administered  . Influenza,inj,Quad PF,6+ Mos 08/27/2018  . Influenza-Unspecified 08/09/2009, 08/28/2010, 09/11/2011, 09/09/2012, 08/27/2013, 11/26/2013, 09/07/2014, 08/27/2015, 08/08/2016  . Pneumococcal Conjugate-13 06/10/2014  . Pneumococcal Polysaccharide-23 01/16/2010, 08/28/2010, 09/11/2011  . Pneumococcal-Unspecified 08/27/2015  . Td 08/03/2008  . Zoster 01/16/2010, 07/30/2011   Pertinent  Health Maintenance Due  Topic Date Due  . DEXA SCAN  10/04/1990  . INFLUENZA VACCINE  Completed  . PNA vac Low Risk Adult  Completed   Fall Risk  08/13/2018 07/16/2018 01/17/2018 11/07/2016 07/10/2016  Falls in the past year? Yes Yes Yes No Yes  Number falls in past yr: 1 2  or more 2 or more - 1  Injury with Fall? No Yes Yes - Yes  Comment - Patient has a bruise to her right forearm and shoulder.  broken ribs - -  Risk Factor Category  - High Fall Risk - - -  Risk for fall due to : - Impaired balance/gait;History of fall(s) - - -  Follow up - Follow up appointment - - -   Functional Status Survey:    Vitals:   11/30/19 1107  BP: (!) 166/61  Pulse: 73  Resp: 18  Temp: 98.1 F (36.7 C)  SpO2: 94%  Weight: 106 lb 12.8 oz (48.4 kg)  Height: 4\' 8"  (1.422 m)   Body mass index is 23.94 kg/m. Physical Exam Vitals reviewed.  Constitutional:      Appearance: Normal appearance.  HENT:     Head: Normocephalic.     Nose: Nose normal.     Mouth/Throat:     Mouth: Mucous membranes are moist.     Pharynx: Oropharynx is clear.  Eyes:  Pupils: Pupils are equal, round, and reactive to light.  Cardiovascular:     Rate and Rhythm: Normal rate and regular rhythm.     Pulses: Normal pulses.  Pulmonary:     Effort: Pulmonary effort is normal. No respiratory distress.     Breath sounds: Normal breath sounds.  Abdominal:     General: Abdomen is flat. Bowel sounds are normal.     Palpations: Abdomen is soft.  Musculoskeletal:        General: No swelling.     Cervical back: Neck supple.  Skin:    General: Skin is warm.  Neurological:     General: No focal deficit present.     Mental Status: She is alert.  Psychiatric:        Thought Content: Thought content normal.     Labs reviewed: Recent Labs    05/25/19 0000 06/22/19 0000 08/20/19 0000  NA 138 140 141  K 4.4 4.9 4.0  CL  --  108 107  CO2  --  27 23*  BUN 37* 26* 20  CREATININE 1.8* 1.8* 1.7*  CALCIUM  --  9.3 9.5   Recent Labs    03/12/19 0000 05/25/19 0000 08/20/19 0000  AST 16 30 19   ALT 16 43* 19  ALKPHOS  --   --  46  ALBUMIN  --   --  3.6   Recent Labs    05/25/19 0000 06/22/19 0000 08/20/19 0000  WBC  --  9.7 9.0  NEUTROABS  --   --  7,110  HGB 10.1* 10.4* 10.6*    HCT 30* 32* 33*  PLT 288 272 341   Lab Results  Component Value Date   TSH 1.82 08/20/2019   Lab Results  Component Value Date   HGBA1C 5.3 01/31/2018   Lab Results  Component Value Date   CHOL 152 07/17/2018   HDL 68 07/17/2018   LDLCALC 66 07/17/2018   TRIG 94 07/17/2018   CHOLHDL 2.2 07/17/2018    Significant Diagnostic Results in last 30 days:  No results found.  Assessment/Plan Dysphagia, unspecified type D/W the Speech and they think it can be related to her Decline in Cognition Has Lost Weight  Will keep trying Dysphagic Diet Will have to change her Meds so Nurses can crush it Discussed in detail with her Daughter to Consider Hospice Her Daughter is open to it but wants to give Speech sometime  Bipolar affective disorder, remission status unspecified ([place]) Change Wellbutrin to 150 mg TID for easy Swallowing Litium to open and mix with her food Also since she has  Behavior Issues will try Seroquel if needed  Mild cognitive impairment Continues Supportive care  CKD (chronic kidney disease) stage 4, GFR 15-29 ml/min (HCC) Creat Stable Essential hypertension Elevated will Check it Qd for few weeks On Lopressor Anemia Low but stable On Iron  Weight Loss with Continue Cognitive decline D/W the daughter to Consider Hospice if no Iprovement Has lost 30 lbs in Past 3 months  Family/ staff Communication:   Labs/tests ordered:   Total time spent in this patient care encounter was  45_  minutes; greater than 50% of the visit spent counseling patient and staff, reviewing records , Labs and coordinating care for problems addressed at this encounter.

## 2019-12-01 DIAGNOSIS — Z20828 Contact with and (suspected) exposure to other viral communicable diseases: Secondary | ICD-10-CM | POA: Diagnosis not present

## 2019-12-28 DEATH — deceased
